# Patient Record
Sex: Male | Born: 1940
Health system: Southern US, Community
[De-identification: ages and names within clinical notes are randomized; demographics above are authoritative.]

## PROBLEM LIST (undated history)

## (undated) DIAGNOSIS — R413 Other amnesia: Secondary | ICD-10-CM

## (undated) DIAGNOSIS — E119 Type 2 diabetes mellitus without complications: Secondary | ICD-10-CM

## (undated) DIAGNOSIS — I10 Essential (primary) hypertension: Secondary | ICD-10-CM

## (undated) HISTORY — DX: Other amnesia: R41.3

---

## 1990-09-18 HISTORY — PX: BACK SURGERY: SHX140

## 2002-06-22 ENCOUNTER — Emergency Department (HOSPITAL_COMMUNITY): Admission: EM | Admit: 2002-06-22 | Discharge: 2002-06-22 | Payer: Self-pay | Admitting: Emergency Medicine

## 2002-06-22 ENCOUNTER — Encounter: Payer: Self-pay | Admitting: Emergency Medicine

## 2004-08-17 ENCOUNTER — Emergency Department (HOSPITAL_COMMUNITY): Admission: EM | Admit: 2004-08-17 | Discharge: 2004-08-17 | Payer: Self-pay | Admitting: Emergency Medicine

## 2005-05-14 ENCOUNTER — Emergency Department: Payer: Self-pay | Admitting: Internal Medicine

## 2005-12-18 ENCOUNTER — Ambulatory Visit: Payer: Self-pay | Admitting: Gastroenterology

## 2006-08-28 IMAGING — US US ABDOMEN COMPLETE
1 series · 14 of 25 positions shown · non-contrast
Comparison: none

HISTORY: Chest and abdominal pain

[Series 1: unknown · 0.34mm/px · 14 of 66 slices shown]
[im 1/66]
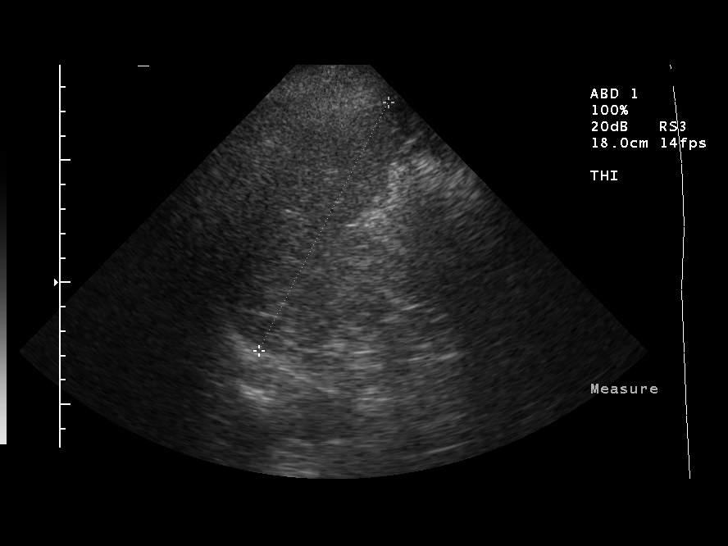
[im 6/66]
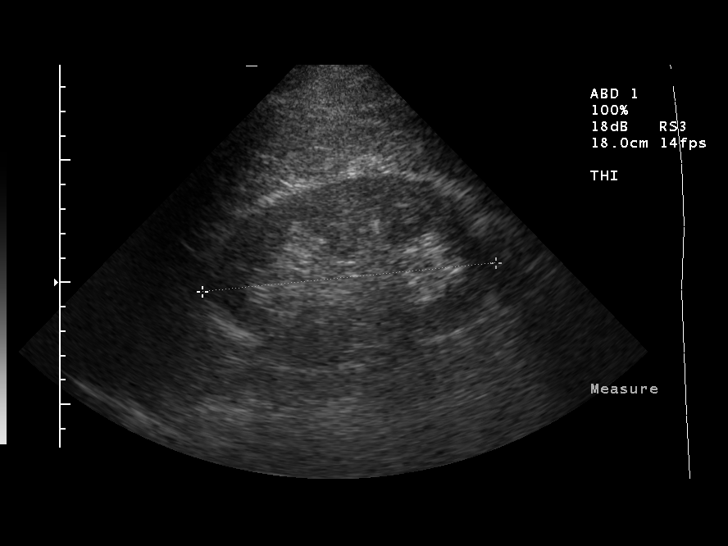
[im 11/66]
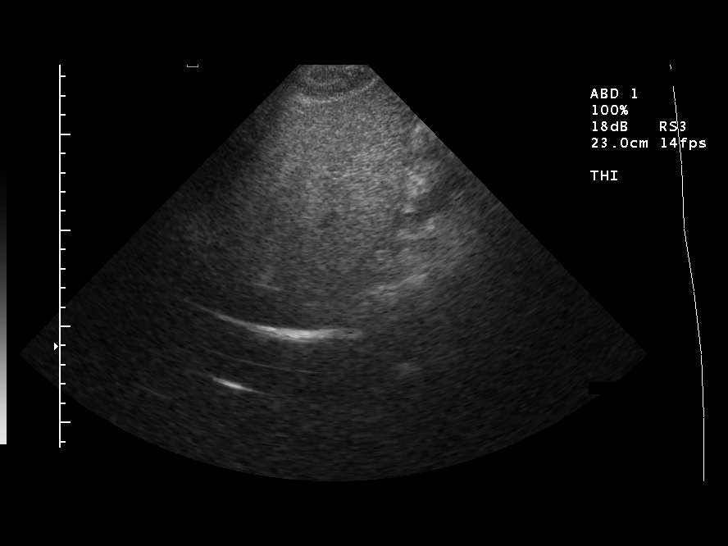
[im 17/66]
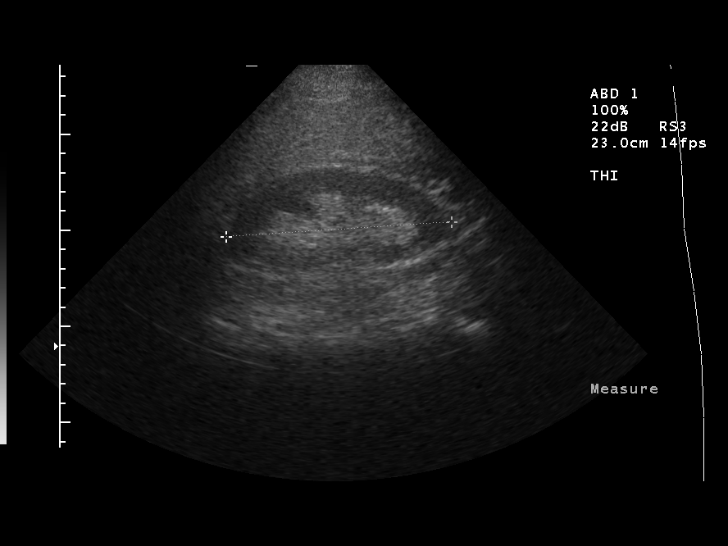
[im 22/66]
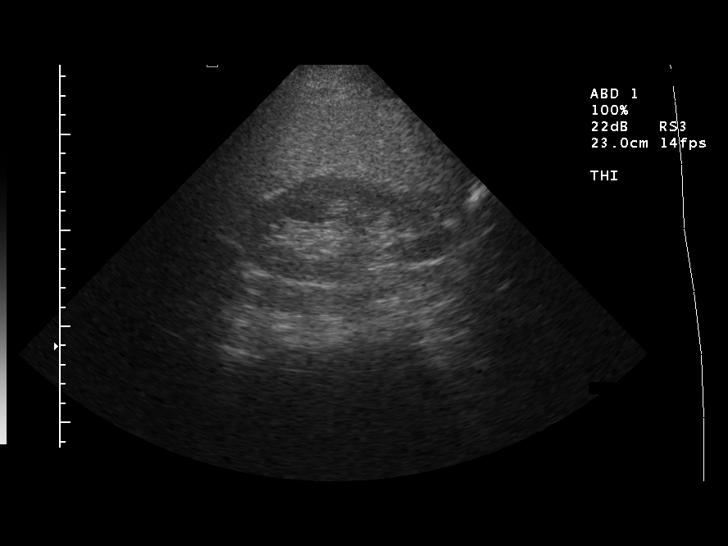
[im 25/66]
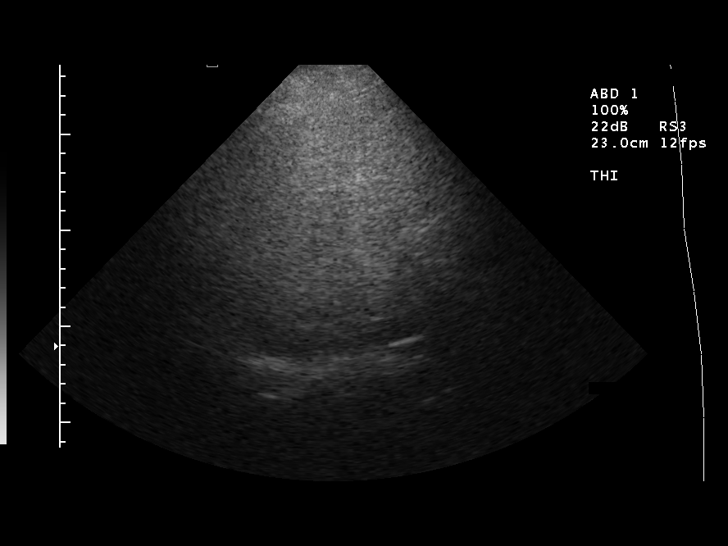
[im 30/66]
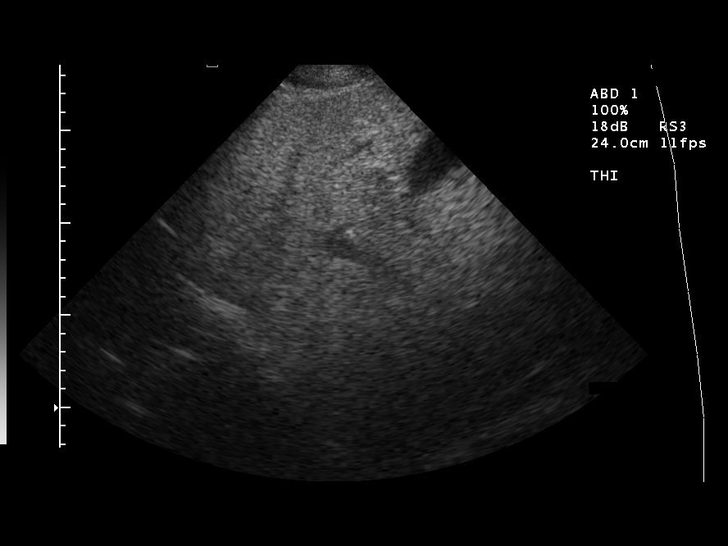
[im 36/66]
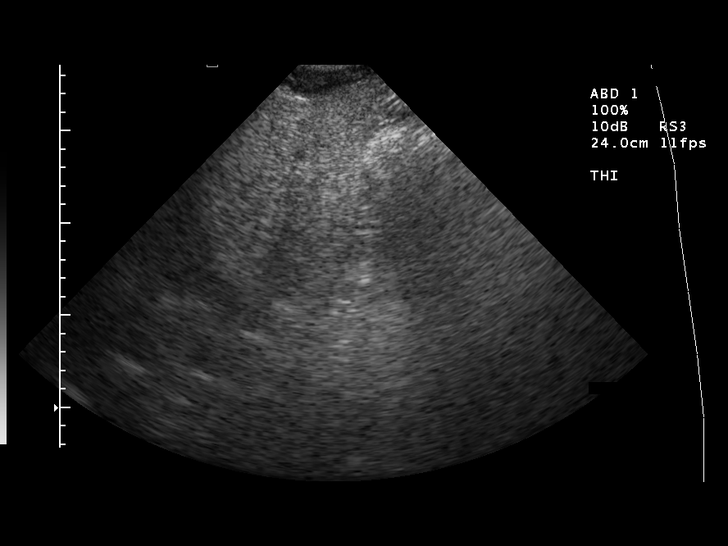
[im 41/66]
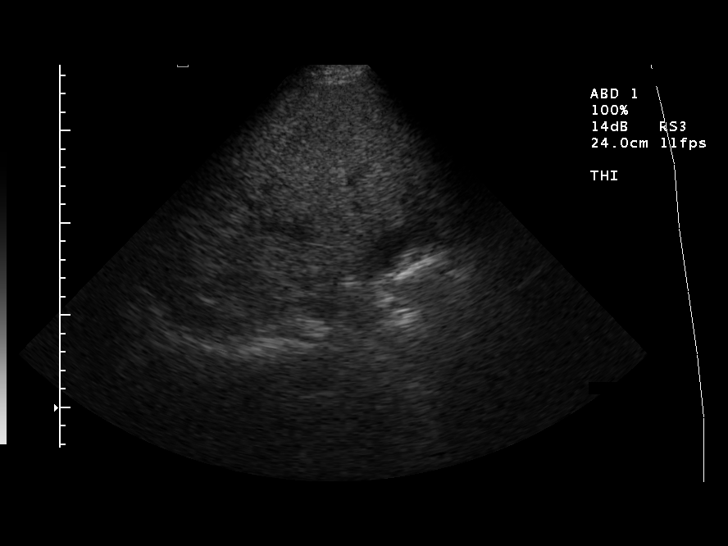
[im 44/66]
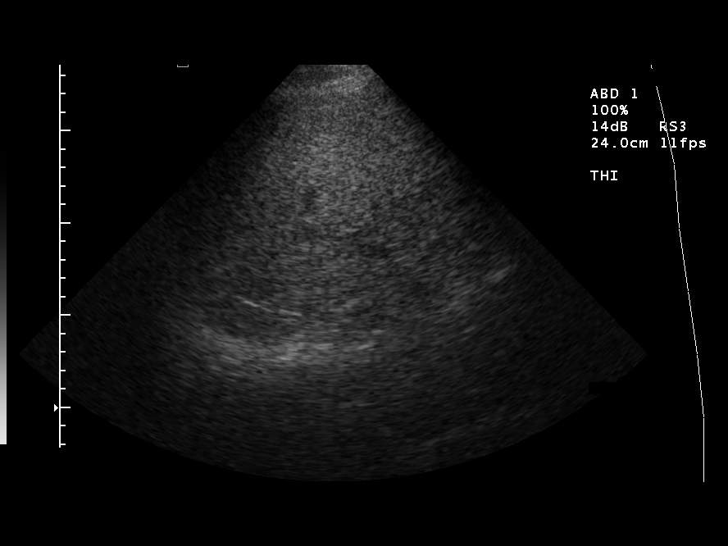
[im 49/66]
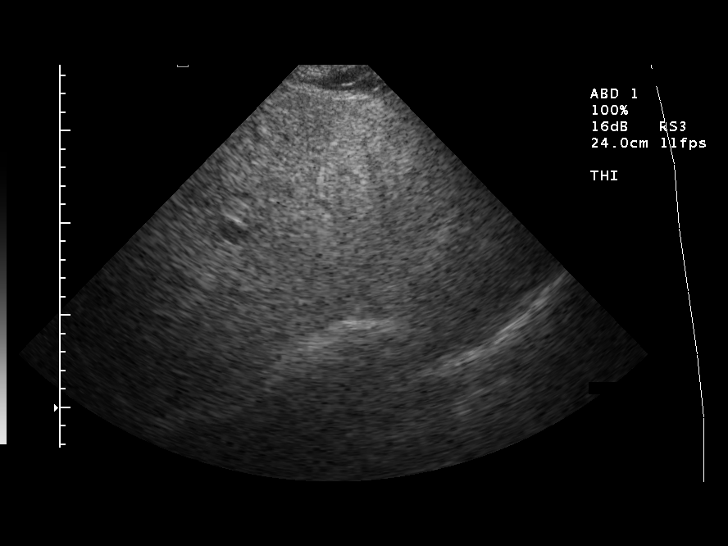
[im 55/66]
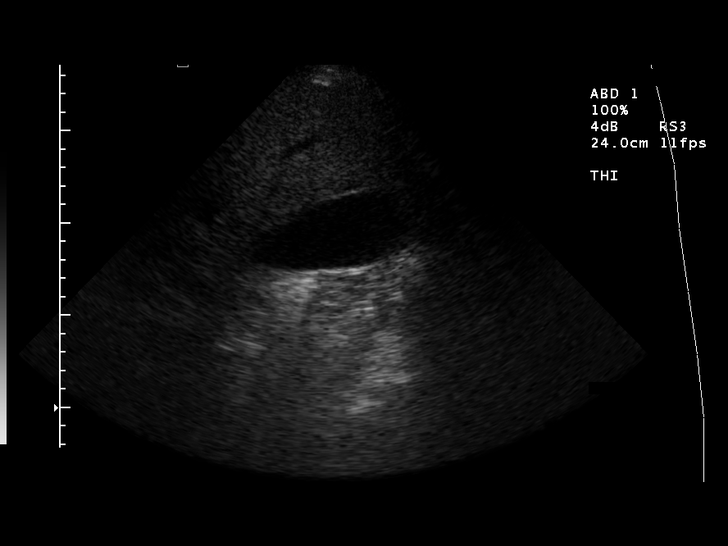
[im 60/66]
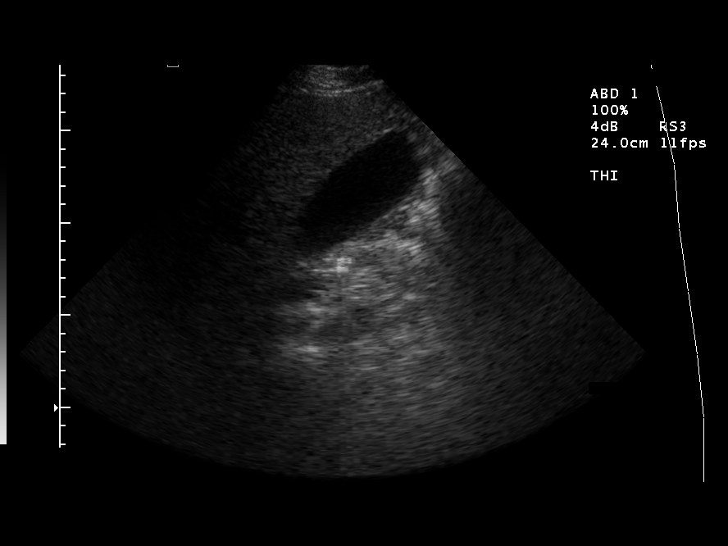
[im 66/66]
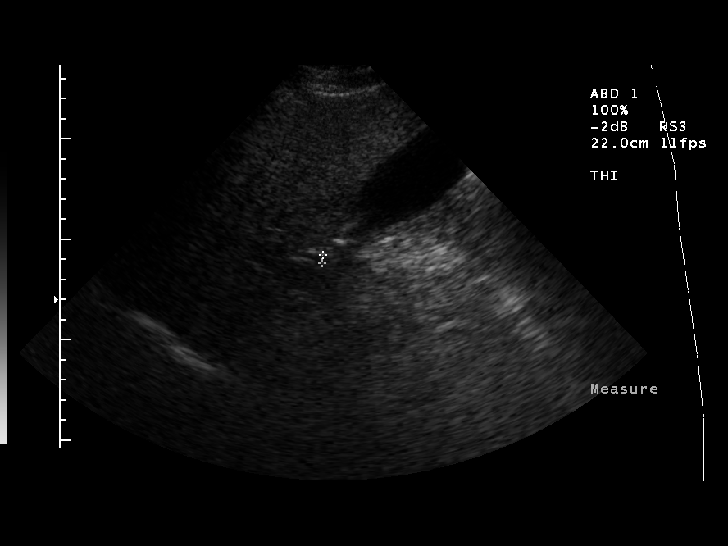

[14 of 25 positions shown; findings below may reference images not displayed]

ULTRASOUND ABDOMEN COMPLETE:

Gallbladder normal without stones or wall thickening.
Common bile duct normal caliber, 4 mm diameter.
Markedly echogenic liver likely represent fatty infiltration.
Poor sound through-transmission through liver, without evidence of gross hepatic
mass.
Pancreas obscured by gout bowel gas.
IVC unremarkable.
Aorta obscured.
Spleen normal size.
Kidneys normal appearance, 11.7 cm length right and 12.1 cm length left.
IMPRESSION: Nonvisualization of pancreas and aorta.
Marked fatty infiltration of liver.
No acute findings.

## 2006-12-13 ENCOUNTER — Emergency Department: Payer: Self-pay | Admitting: Emergency Medicine

## 2006-12-13 ENCOUNTER — Other Ambulatory Visit: Payer: Self-pay

## 2010-11-04 ENCOUNTER — Ambulatory Visit: Payer: Self-pay | Admitting: Internal Medicine

## 2010-11-18 ENCOUNTER — Ambulatory Visit: Payer: Self-pay | Admitting: Internal Medicine

## 2012-06-15 ENCOUNTER — Emergency Department: Payer: Self-pay | Admitting: *Deleted

## 2012-06-15 LAB — PRO B NATRIURETIC PEPTIDE: B-Type Natriuretic Peptide: 77 pg/mL (ref 0–125)

## 2012-06-15 LAB — CBC
HCT: 42.3 % (ref 40.0–52.0)
HGB: 13.8 g/dL (ref 13.0–18.0)
MCH: 29.9 pg (ref 26.0–34.0)
MCHC: 32.6 g/dL (ref 32.0–36.0)
MCV: 92 fL (ref 80–100)
Platelet: 285 10*3/uL (ref 150–440)
RBC: 4.62 10*6/uL (ref 4.40–5.90)
RDW: 12.8 % (ref 11.5–14.5)
WBC: 11.5 10*3/uL — ABNORMAL HIGH (ref 3.8–10.6)

## 2012-06-15 LAB — COMPREHENSIVE METABOLIC PANEL
Albumin: 3.7 g/dL (ref 3.4–5.0)
Alkaline Phosphatase: 85 U/L (ref 50–136)
Anion Gap: 12 (ref 7–16)
BUN: 15 mg/dL (ref 7–18)
Bilirubin,Total: 0.3 mg/dL (ref 0.2–1.0)
Calcium, Total: 8.9 mg/dL (ref 8.5–10.1)
Chloride: 104 mmol/L (ref 98–107)
Co2: 26 mmol/L (ref 21–32)
Creatinine: 1.22 mg/dL (ref 0.60–1.30)
EGFR (African American): >60
EGFR (Non-African Amer.): 60 — ABNORMAL LOW
Glucose: 153 mg/dL — ABNORMAL HIGH (ref 65–99)
Osmolality: 287 (ref 275–301)
Potassium: 3.4 mmol/L — ABNORMAL LOW (ref 3.5–5.1)
SGOT(AST): 20 U/L (ref 15–37)
SGPT (ALT): 33 U/L (ref 12–78)
Sodium: 142 mmol/L (ref 136–145)
Total Protein: 7.7 g/dL (ref 6.4–8.2)

## 2012-06-15 LAB — CK TOTAL AND CKMB (NOT AT ARMC)
CK, Total: 141 U/L (ref 35–232)
CK-MB: 2.3 ng/mL (ref 0.5–3.6)

## 2012-06-15 LAB — TROPONIN I: Troponin-I: <0.02 ng/mL

## 2014-06-15 ENCOUNTER — Ambulatory Visit: Payer: Self-pay | Admitting: Internal Medicine

## 2015-03-03 DIAGNOSIS — Z125 Encounter for screening for malignant neoplasm of prostate: Secondary | ICD-10-CM | POA: Diagnosis not present

## 2015-03-03 DIAGNOSIS — E784 Other hyperlipidemia: Secondary | ICD-10-CM | POA: Diagnosis not present

## 2015-03-03 DIAGNOSIS — I10 Essential (primary) hypertension: Secondary | ICD-10-CM | POA: Diagnosis not present

## 2015-03-03 DIAGNOSIS — R5381 Other malaise: Secondary | ICD-10-CM | POA: Diagnosis not present

## 2015-03-18 DIAGNOSIS — E119 Type 2 diabetes mellitus without complications: Secondary | ICD-10-CM | POA: Diagnosis not present

## 2015-03-18 DIAGNOSIS — M5440 Lumbago with sciatica, unspecified side: Secondary | ICD-10-CM | POA: Diagnosis not present

## 2015-03-18 DIAGNOSIS — N423 Dysplasia of prostate: Secondary | ICD-10-CM | POA: Diagnosis not present

## 2015-03-18 DIAGNOSIS — K210 Gastro-esophageal reflux disease with esophagitis: Secondary | ICD-10-CM | POA: Diagnosis not present

## 2015-05-25 DIAGNOSIS — J209 Acute bronchitis, unspecified: Secondary | ICD-10-CM | POA: Diagnosis not present

## 2015-05-25 DIAGNOSIS — J028 Acute pharyngitis due to other specified organisms: Secondary | ICD-10-CM | POA: Diagnosis not present

## 2015-05-25 DIAGNOSIS — B9689 Other specified bacterial agents as the cause of diseases classified elsewhere: Secondary | ICD-10-CM | POA: Diagnosis not present

## 2015-07-06 DIAGNOSIS — Z23 Encounter for immunization: Secondary | ICD-10-CM | POA: Diagnosis not present

## 2015-07-12 DIAGNOSIS — E119 Type 2 diabetes mellitus without complications: Secondary | ICD-10-CM | POA: Diagnosis not present

## 2015-07-13 DIAGNOSIS — E119 Type 2 diabetes mellitus without complications: Secondary | ICD-10-CM | POA: Diagnosis not present

## 2015-07-13 DIAGNOSIS — Z0000 Encounter for general adult medical examination without abnormal findings: Secondary | ICD-10-CM | POA: Diagnosis not present

## 2015-07-13 DIAGNOSIS — Z23 Encounter for immunization: Secondary | ICD-10-CM | POA: Diagnosis not present

## 2015-07-23 DIAGNOSIS — R05 Cough: Secondary | ICD-10-CM | POA: Diagnosis not present

## 2015-07-23 DIAGNOSIS — B9689 Other specified bacterial agents as the cause of diseases classified elsewhere: Secondary | ICD-10-CM | POA: Diagnosis not present

## 2015-07-23 DIAGNOSIS — J028 Acute pharyngitis due to other specified organisms: Secondary | ICD-10-CM | POA: Diagnosis not present

## 2015-07-23 DIAGNOSIS — E784 Other hyperlipidemia: Secondary | ICD-10-CM | POA: Diagnosis not present

## 2015-10-21 DIAGNOSIS — E119 Type 2 diabetes mellitus without complications: Secondary | ICD-10-CM | POA: Diagnosis not present

## 2015-10-21 DIAGNOSIS — R55 Syncope and collapse: Secondary | ICD-10-CM | POA: Diagnosis not present

## 2015-10-26 DIAGNOSIS — E119 Type 2 diabetes mellitus without complications: Secondary | ICD-10-CM | POA: Diagnosis not present

## 2015-10-26 DIAGNOSIS — R55 Syncope and collapse: Secondary | ICD-10-CM | POA: Diagnosis not present

## 2015-10-26 DIAGNOSIS — G8929 Other chronic pain: Secondary | ICD-10-CM | POA: Diagnosis not present

## 2015-10-28 DIAGNOSIS — R55 Syncope and collapse: Secondary | ICD-10-CM | POA: Diagnosis not present

## 2015-10-28 DIAGNOSIS — G8929 Other chronic pain: Secondary | ICD-10-CM | POA: Diagnosis not present

## 2015-10-28 DIAGNOSIS — E119 Type 2 diabetes mellitus without complications: Secondary | ICD-10-CM | POA: Diagnosis not present

## 2015-10-29 DIAGNOSIS — G8929 Other chronic pain: Secondary | ICD-10-CM | POA: Diagnosis not present

## 2015-10-29 DIAGNOSIS — E119 Type 2 diabetes mellitus without complications: Secondary | ICD-10-CM | POA: Diagnosis not present

## 2015-10-29 DIAGNOSIS — R55 Syncope and collapse: Secondary | ICD-10-CM | POA: Diagnosis not present

## 2016-03-31 DIAGNOSIS — E119 Type 2 diabetes mellitus without complications: Secondary | ICD-10-CM | POA: Diagnosis not present

## 2016-03-31 DIAGNOSIS — K210 Gastro-esophageal reflux disease with esophagitis: Secondary | ICD-10-CM | POA: Diagnosis not present

## 2016-03-31 DIAGNOSIS — M545 Low back pain: Secondary | ICD-10-CM | POA: Diagnosis not present

## 2016-05-26 DIAGNOSIS — K210 Gastro-esophageal reflux disease with esophagitis: Secondary | ICD-10-CM | POA: Diagnosis not present

## 2016-05-26 DIAGNOSIS — M545 Low back pain: Secondary | ICD-10-CM | POA: Diagnosis not present

## 2016-05-26 DIAGNOSIS — E119 Type 2 diabetes mellitus without complications: Secondary | ICD-10-CM | POA: Diagnosis not present

## 2016-08-25 DIAGNOSIS — M5440 Lumbago with sciatica, unspecified side: Secondary | ICD-10-CM | POA: Diagnosis not present

## 2016-08-25 DIAGNOSIS — D291 Benign neoplasm of prostate: Secondary | ICD-10-CM | POA: Diagnosis not present

## 2016-08-25 DIAGNOSIS — M25569 Pain in unspecified knee: Secondary | ICD-10-CM | POA: Diagnosis not present

## 2016-08-25 DIAGNOSIS — E119 Type 2 diabetes mellitus without complications: Secondary | ICD-10-CM | POA: Diagnosis not present

## 2016-11-28 DIAGNOSIS — D291 Benign neoplasm of prostate: Secondary | ICD-10-CM | POA: Diagnosis not present

## 2016-11-28 DIAGNOSIS — K591 Functional diarrhea: Secondary | ICD-10-CM | POA: Diagnosis not present

## 2016-11-28 DIAGNOSIS — I10 Essential (primary) hypertension: Secondary | ICD-10-CM | POA: Diagnosis not present

## 2016-11-28 DIAGNOSIS — E119 Type 2 diabetes mellitus without complications: Secondary | ICD-10-CM | POA: Diagnosis not present

## 2017-09-25 DIAGNOSIS — M25569 Pain in unspecified knee: Secondary | ICD-10-CM | POA: Diagnosis not present

## 2017-09-25 DIAGNOSIS — E119 Type 2 diabetes mellitus without complications: Secondary | ICD-10-CM | POA: Diagnosis not present

## 2017-09-25 DIAGNOSIS — M545 Low back pain: Secondary | ICD-10-CM | POA: Diagnosis not present

## 2017-09-25 DIAGNOSIS — I10 Essential (primary) hypertension: Secondary | ICD-10-CM | POA: Diagnosis not present

## 2017-12-24 DIAGNOSIS — E119 Type 2 diabetes mellitus without complications: Secondary | ICD-10-CM | POA: Diagnosis not present

## 2017-12-24 DIAGNOSIS — M545 Low back pain: Secondary | ICD-10-CM | POA: Diagnosis not present

## 2017-12-24 DIAGNOSIS — G8929 Other chronic pain: Secondary | ICD-10-CM | POA: Diagnosis not present

## 2017-12-24 DIAGNOSIS — I10 Essential (primary) hypertension: Secondary | ICD-10-CM | POA: Diagnosis not present

## 2017-12-29 ENCOUNTER — Emergency Department
Admission: EM | Admit: 2017-12-29 | Discharge: 2017-12-29 | Disposition: A | Discharge status: Home / Self Care | Payer: Medicare HMO | Attending: Emergency Medicine | Admitting: Emergency Medicine

## 2017-12-29 ENCOUNTER — Other Ambulatory Visit: Payer: Self-pay

## 2017-12-29 ENCOUNTER — Encounter: Payer: Self-pay | Admitting: Emergency Medicine

## 2017-12-29 DIAGNOSIS — Z87891 Personal history of nicotine dependence: Secondary | ICD-10-CM | POA: Diagnosis not present

## 2017-12-29 DIAGNOSIS — R1111 Vomiting without nausea: Secondary | ICD-10-CM | POA: Diagnosis not present

## 2017-12-29 DIAGNOSIS — E119 Type 2 diabetes mellitus without complications: Secondary | ICD-10-CM | POA: Insufficient documentation

## 2017-12-29 DIAGNOSIS — K2900 Acute gastritis without bleeding: Secondary | ICD-10-CM | POA: Insufficient documentation

## 2017-12-29 DIAGNOSIS — I10 Essential (primary) hypertension: Secondary | ICD-10-CM | POA: Diagnosis not present

## 2017-12-29 DIAGNOSIS — R112 Nausea with vomiting, unspecified: Secondary | ICD-10-CM | POA: Diagnosis not present

## 2017-12-29 HISTORY — DX: Type 2 diabetes mellitus without complications: E11.9

## 2017-12-29 HISTORY — DX: Essential (primary) hypertension: I10

## 2017-12-29 LAB — LIPASE, BLOOD: LIPASE: 26 U/L (ref 11–51)

## 2017-12-29 LAB — COMPREHENSIVE METABOLIC PANEL
ALT: 17 U/L (ref 17–63)
ANION GAP: 7 (ref 5–15)
AST: 23 U/L (ref 15–41)
Albumin: 3.8 g/dL (ref 3.5–5.0)
Alkaline Phosphatase: 61 U/L (ref 38–126)
BUN: 22 mg/dL — ABNORMAL HIGH (ref 6–20)
CHLORIDE: 105 mmol/L (ref 101–111)
CO2: 24 mmol/L (ref 22–32)
CREATININE: 1.55 mg/dL — AB (ref 0.61–1.24)
Calcium: 8.5 mg/dL — ABNORMAL LOW (ref 8.9–10.3)
GFR, EST AFRICAN AMERICAN: 48 mL/min — AB (ref >60)
GFR, EST NON AFRICAN AMERICAN: 42 mL/min — AB (ref >60)
Glucose, Bld: 161 mg/dL — ABNORMAL HIGH (ref 65–99)
POTASSIUM: 3.1 mmol/L — AB (ref 3.5–5.1)
Sodium: 136 mmol/L (ref 135–145)
Total Bilirubin: 0.6 mg/dL (ref 0.3–1.2)
Total Protein: 6.9 g/dL (ref 6.5–8.1)

## 2017-12-29 LAB — CBC
HCT: 36.8 % — ABNORMAL LOW (ref 40.0–52.0)
Hemoglobin: 12.8 g/dL — ABNORMAL LOW (ref 13.0–18.0)
MCH: 32.1 pg (ref 26.0–34.0)
MCHC: 34.7 g/dL (ref 32.0–36.0)
MCV: 92.5 fL (ref 80.0–100.0)
PLATELETS: 275 10*3/uL (ref 150–440)
RBC: 3.98 MIL/uL — ABNORMAL LOW (ref 4.40–5.90)
RDW: 13.2 % (ref 11.5–14.5)
WBC: 12.2 10*3/uL — ABNORMAL HIGH (ref 3.8–10.6)

## 2017-12-29 LAB — TROPONIN I

## 2017-12-29 MED ORDER — SODIUM CHLORIDE 0.9 % IV SOLN
1000.0000 mL | Freq: Once | INTRAVENOUS | Status: AC
  Administered 2017-12-29: 1000 mL via INTRAVENOUS

## 2017-12-29 MED ORDER — ONDANSETRON HCL 4 MG/2ML IJ SOLN
4.0000 mg | Freq: Once | INTRAMUSCULAR | Status: AC
  Administered 2017-12-29: 4 mg via INTRAVENOUS
  Filled 2017-12-29: qty 2

## 2017-12-29 MED ORDER — ONDANSETRON 4 MG PO TBDP: 4.0000 mg | ORAL_TABLET | Freq: Three times a day (TID) | ORAL | 0 refills | Status: DC | PRN

## 2017-12-29 NOTE — ED Provider Notes (Addendum)
[facility] Emergency Department Provider Note   ____________________________________________    I have reviewed the triage vital signs and the nursing notes.   HISTORY  Chief Complaint Emesis     HPI Bob Brewer is a 77 y.o. male who presents with complaints of nausea and vomiting.  Patient has a history of diabetes.  He reports he ate dinner at approximately 430, he had spaghetti, about 10-15 minutes later he started having nausea and vomiting.  He denies abdominal pain, just complains of "feeling sick ".  No fevers or chills.  No one else got sick.  No recent travel.  No diarrhea reported.  Has not taken anything for this.  No chest pain or shortness of breath   Past Medical History:  Diagnosis Date  . Diabetes mellitus without complication ([place])   . Hypertension     There are no active problems to display for this patient.   History reviewed. No pertinent surgical history.  Prior to Admission medications   Medication Sig Start Date End Date Taking? Authorizing Provider  ondansetron (ZOFRAN ODT) 4 MG disintegrating tablet Take 1 tablet (4 mg total) by mouth every 8 (eight) hours as needed for nausea or vomiting. 12/29/17   Lavonia Drafts, MD     Allergies Patient has no known allergies.  History reviewed. No pertinent family history.  Social History Social History   Tobacco Use  . Smoking status: Former Research scientist (life sciences)  . Smokeless tobacco: Never Used  Substance Use Topics  . Alcohol use: Not Currently  . Drug use: Never    Review of Systems  Constitutional: No fever/chills Eyes: No visual changes.  ENT: No sore throat. Cardiovascular: Denies chest pain. Respiratory: Denies shortness of breath. Gastrointestinal: As above Genitourinary: Negative for dysuria. Musculoskeletal: Negative for back pain. Skin: Negative for rash. Neurological: Negative for headaches    ____________________________________________   PHYSICAL  EXAM:  VITAL SIGNS: ED Triage Vitals  Enc Vitals Group     BP 12/29/17 1736 132/72     Pulse Rate 12/29/17 1736 69     Resp 12/29/17 1736 16     Temp 12/29/17 1736 97.7 F (36.5 C)     Temp Source 12/29/17 1736 Oral     SpO2 12/29/17 1736 99 %     Weight 12/29/17 1733 83.9 kg (185 lb)     Height 12/29/17 1733 1.727 m (5\' 8" )     Head Circumference --      Peak Flow --      Pain Score 12/29/17 1733 0     Pain Loc --      Pain Edu? --      Excl. in [place]? --     Constitutional: Alert and oriented. No acute distress. Pleasant and interactive Eyes: Conjunctivae are normal.   Nose: No congestion/rhinnorhea. Mouth/Throat: Mucous membranes are moist.    Cardiovascular: Normal rate, regular rhythm. Grossly normal heart sounds.  Good peripheral circulation. Respiratory: Normal respiratory effort.  No retractions. Lungs CTAB. Gastrointestinal: Soft and nontender. No distention.  No CVA tenderness. Genitourinary: deferred Musculoskeletal: No lower extremity tenderness nor edema.  Warm and well perfused Neurologic:  Normal speech and language. No gross focal neurologic deficits are appreciated.  Skin:  Skin is warm, dry and intact. No rash noted. Psychiatric: Mood and affect are normal. Speech and behavior are normal.  ____________________________________________   LABS (all labs ordered are listed, but only abnormal results are displayed)  Labs Reviewed  CBC - Abnormal; Notable  for the following components:      Result Value   WBC 12.2 (*)    RBC 3.98 (*)    Hemoglobin 12.8 (*)    HCT 36.8 (*)    All other components within normal limits  COMPREHENSIVE METABOLIC PANEL - Abnormal; Notable for the following components:   Potassium 3.1 (*)    Glucose, Bld 161 (*)    BUN 22 (*)    Creatinine, Ser 1.55 (*)    Calcium 8.5 (*)    GFR calc non Af Amer 42 (*)    GFR calc Af Amer 48 (*)    All other components within normal limits  LIPASE, BLOOD  TROPONIN I    ____________________________________________  EKG  ED ECG REPORT I, Lavonia Drafts, the attending physician, personally viewed and interpreted this ECG.  Date: 01/14/2018  Rhythm: normal sinus rhythm QRS Axis: normal Intervals: normal ST/T Wave abnormalities: normal Narrative Interpretation: no evidence of acute ischemia  ____________________________________________  RADIOLOGY  None ____________________________________________   PROCEDURES  Procedure(s) performed: No  Procedures   Critical Care performed: No ____________________________________________   INITIAL IMPRESSION / ASSESSMENT AND PLAN / ED COURSE  Pertinent labs & imaging results that were available during my care of the patient were reviewed by me and considered in my medical decision making (see chart for details).  Patient presents with nausea/vomiting after eating spaghetti.  Reassuring abdominal exam, will give IV fluids, IV Zofran, check labs, place patient on a cardiac monitor obtain EKG and reevaluate.  After IV fluids patient reports feeling significantly better.  He no longer has any nausea.  He looks significantly better.  Family reports that he is back to baseline.  No abdominal pain, he is anxious to go home I think this is reasonable.  Recommend continued fluid intake, Zofran Rx    ____________________________________________   FINAL CLINICAL IMPRESSION(S) / ED DIAGNOSES  Final diagnoses:  Acute gastritis without hemorrhage, unspecified gastritis type        Note:  This document was prepared using Dragon voice recognition software and may include unintentional dictation errors.    Lavonia Drafts, MD 12/29/17 2105    Lavonia Drafts, MD 01/14/18 1540

## 2017-12-29 NOTE — ED Triage Notes (Signed)
Pt prsents to ED via [facility] from home c/o vomiting. Pt states he had dinner at 1630 and since has vomited multiple times. Pt denies pain at this time. Given 4mg  Zofran IV PTA by EMS.

## 2017-12-29 NOTE — ED Notes (Signed)

## 2018-06-19 DIAGNOSIS — Z23 Encounter for immunization: Secondary | ICD-10-CM | POA: Diagnosis not present

## 2018-07-22 DIAGNOSIS — E119 Type 2 diabetes mellitus without complications: Secondary | ICD-10-CM | POA: Diagnosis not present

## 2018-07-22 DIAGNOSIS — E7849 Other hyperlipidemia: Secondary | ICD-10-CM | POA: Diagnosis not present

## 2018-07-22 DIAGNOSIS — R5381 Other malaise: Secondary | ICD-10-CM | POA: Diagnosis not present

## 2018-07-22 DIAGNOSIS — M545 Low back pain: Secondary | ICD-10-CM | POA: Diagnosis not present

## 2018-07-22 DIAGNOSIS — G8929 Other chronic pain: Secondary | ICD-10-CM | POA: Diagnosis not present

## 2018-07-22 DIAGNOSIS — I10 Essential (primary) hypertension: Secondary | ICD-10-CM | POA: Diagnosis not present

## 2018-07-22 DIAGNOSIS — Z0000 Encounter for general adult medical examination without abnormal findings: Secondary | ICD-10-CM | POA: Diagnosis not present

## 2018-07-22 DIAGNOSIS — Z125 Encounter for screening for malignant neoplasm of prostate: Secondary | ICD-10-CM | POA: Diagnosis not present

## 2018-08-05 DIAGNOSIS — G8929 Other chronic pain: Secondary | ICD-10-CM | POA: Diagnosis not present

## 2018-08-05 DIAGNOSIS — M545 Low back pain: Secondary | ICD-10-CM | POA: Diagnosis not present

## 2018-08-05 DIAGNOSIS — E119 Type 2 diabetes mellitus without complications: Secondary | ICD-10-CM | POA: Diagnosis not present

## 2018-08-05 DIAGNOSIS — I10 Essential (primary) hypertension: Secondary | ICD-10-CM | POA: Diagnosis not present

## 2019-01-31 DIAGNOSIS — Z0000 Encounter for general adult medical examination without abnormal findings: Secondary | ICD-10-CM | POA: Diagnosis not present

## 2019-01-31 DIAGNOSIS — G8929 Other chronic pain: Secondary | ICD-10-CM | POA: Diagnosis not present

## 2019-01-31 DIAGNOSIS — R413 Other amnesia: Secondary | ICD-10-CM | POA: Diagnosis not present

## 2019-01-31 DIAGNOSIS — E119 Type 2 diabetes mellitus without complications: Secondary | ICD-10-CM | POA: Diagnosis not present

## 2019-01-31 DIAGNOSIS — M545 Low back pain: Secondary | ICD-10-CM | POA: Diagnosis not present

## 2019-04-10 DIAGNOSIS — G8929 Other chronic pain: Secondary | ICD-10-CM | POA: Diagnosis not present

## 2019-04-10 DIAGNOSIS — M5440 Lumbago with sciatica, unspecified side: Secondary | ICD-10-CM | POA: Diagnosis not present

## 2019-04-10 DIAGNOSIS — E119 Type 2 diabetes mellitus without complications: Secondary | ICD-10-CM | POA: Diagnosis not present

## 2019-04-10 DIAGNOSIS — R413 Other amnesia: Secondary | ICD-10-CM | POA: Diagnosis not present

## 2019-06-29 DIAGNOSIS — R69 Illness, unspecified: Secondary | ICD-10-CM | POA: Diagnosis not present

## 2019-07-16 DIAGNOSIS — I10 Essential (primary) hypertension: Secondary | ICD-10-CM | POA: Diagnosis not present

## 2019-07-16 DIAGNOSIS — Z125 Encounter for screening for malignant neoplasm of prostate: Secondary | ICD-10-CM | POA: Diagnosis not present

## 2019-07-16 DIAGNOSIS — E119 Type 2 diabetes mellitus without complications: Secondary | ICD-10-CM | POA: Diagnosis not present

## 2019-07-16 DIAGNOSIS — R5381 Other malaise: Secondary | ICD-10-CM | POA: Diagnosis not present

## 2019-07-16 DIAGNOSIS — E7849 Other hyperlipidemia: Secondary | ICD-10-CM | POA: Diagnosis not present

## 2019-07-28 ENCOUNTER — Other Ambulatory Visit: Payer: Self-pay

## 2019-07-28 DIAGNOSIS — Z20822 Contact with and (suspected) exposure to covid-19: Secondary | ICD-10-CM

## 2019-07-29 ENCOUNTER — Ambulatory Visit: Payer: Self-pay

## 2019-07-29 DIAGNOSIS — I10 Essential (primary) hypertension: Secondary | ICD-10-CM | POA: Diagnosis not present

## 2019-07-29 DIAGNOSIS — E119 Type 2 diabetes mellitus without complications: Secondary | ICD-10-CM | POA: Diagnosis not present

## 2019-07-29 DIAGNOSIS — M545 Low back pain: Secondary | ICD-10-CM | POA: Diagnosis not present

## 2019-07-29 DIAGNOSIS — G8929 Other chronic pain: Secondary | ICD-10-CM | POA: Diagnosis not present

## 2019-07-29 LAB — NOVEL CORONAVIRUS, NAA: SARS-CoV-2, NAA: DETECTED — AB

## 2019-07-29 NOTE — Telephone Encounter (Signed)
Provided Covid results to Patient.  Provided care advice to pt.   Voiced understanding.

## 2019-08-21 DIAGNOSIS — R69 Illness, unspecified: Secondary | ICD-10-CM | POA: Diagnosis not present

## 2019-08-28 DIAGNOSIS — R69 Illness, unspecified: Secondary | ICD-10-CM | POA: Diagnosis not present

## 2019-10-24 DIAGNOSIS — I4891 Unspecified atrial fibrillation: Secondary | ICD-10-CM | POA: Diagnosis not present

## 2019-10-24 DIAGNOSIS — E538 Deficiency of other specified B group vitamins: Secondary | ICD-10-CM | POA: Diagnosis not present

## 2019-10-24 DIAGNOSIS — E119 Type 2 diabetes mellitus without complications: Secondary | ICD-10-CM | POA: Diagnosis not present

## 2019-10-24 DIAGNOSIS — I10 Essential (primary) hypertension: Secondary | ICD-10-CM | POA: Diagnosis not present

## 2019-10-24 DIAGNOSIS — M545 Low back pain: Secondary | ICD-10-CM | POA: Diagnosis not present

## 2019-10-24 DIAGNOSIS — M542 Cervicalgia: Secondary | ICD-10-CM | POA: Diagnosis not present

## 2019-10-24 DIAGNOSIS — G8929 Other chronic pain: Secondary | ICD-10-CM | POA: Diagnosis not present

## 2019-10-24 DIAGNOSIS — R5381 Other malaise: Secondary | ICD-10-CM | POA: Diagnosis not present

## 2019-10-24 DIAGNOSIS — E7849 Other hyperlipidemia: Secondary | ICD-10-CM | POA: Diagnosis not present

## 2019-10-27 DIAGNOSIS — R413 Other amnesia: Secondary | ICD-10-CM | POA: Diagnosis not present

## 2019-10-27 DIAGNOSIS — I4891 Unspecified atrial fibrillation: Secondary | ICD-10-CM | POA: Diagnosis not present

## 2019-10-27 DIAGNOSIS — I10 Essential (primary) hypertension: Secondary | ICD-10-CM | POA: Diagnosis not present

## 2019-10-27 DIAGNOSIS — E119 Type 2 diabetes mellitus without complications: Secondary | ICD-10-CM | POA: Diagnosis not present

## 2019-11-24 ENCOUNTER — Other Ambulatory Visit: Payer: Self-pay | Admitting: Internal Medicine

## 2019-11-24 DIAGNOSIS — I4891 Unspecified atrial fibrillation: Secondary | ICD-10-CM | POA: Diagnosis not present

## 2019-11-24 DIAGNOSIS — R413 Other amnesia: Secondary | ICD-10-CM | POA: Diagnosis not present

## 2019-11-24 DIAGNOSIS — E119 Type 2 diabetes mellitus without complications: Secondary | ICD-10-CM | POA: Diagnosis not present

## 2019-11-24 DIAGNOSIS — I10 Essential (primary) hypertension: Secondary | ICD-10-CM | POA: Diagnosis not present

## 2019-12-01 DIAGNOSIS — R413 Other amnesia: Secondary | ICD-10-CM | POA: Diagnosis not present

## 2019-12-01 DIAGNOSIS — M545 Low back pain: Secondary | ICD-10-CM | POA: Diagnosis not present

## 2019-12-01 DIAGNOSIS — G8929 Other chronic pain: Secondary | ICD-10-CM | POA: Diagnosis not present

## 2019-12-01 DIAGNOSIS — E538 Deficiency of other specified B group vitamins: Secondary | ICD-10-CM | POA: Diagnosis not present

## 2019-12-02 ENCOUNTER — Ambulatory Visit: Payer: Medicare HMO | Attending: Internal Medicine

## 2020-01-12 ENCOUNTER — Ambulatory Visit: Payer: Medicare HMO

## 2020-01-26 ENCOUNTER — Ambulatory Visit: Payer: Medicare HMO | Admitting: Internal Medicine

## 2020-01-26 ENCOUNTER — Telehealth: Payer: Self-pay | Admitting: Internal Medicine

## 2020-01-26 ENCOUNTER — Other Ambulatory Visit: Payer: Self-pay

## 2020-01-26 MED ORDER — DONEPEZIL HCL 10 MG PO TABS: 10.0000 mg | ORAL_TABLET | Freq: Every day | ORAL | 3 refills | Status: DC

## 2020-01-26 MED ORDER — HYDROCHLOROTHIAZIDE 25 MG PO TABS: 25.0000 mg | ORAL_TABLET | Freq: Every day | ORAL | 3 refills | Status: DC

## 2020-01-26 MED ORDER — FINASTERIDE 5 MG PO TABS: 5.0000 mg | ORAL_TABLET | Freq: Every day | ORAL | 3 refills | Status: DC

## 2020-01-26 MED ORDER — LISINOPRIL 40 MG PO TABS: 40.0000 mg | ORAL_TABLET | Freq: Every day | ORAL | 3 refills | Status: DC

## 2020-01-26 MED ORDER — PRAVASTATIN SODIUM 40 MG PO TABS: 40.0000 mg | ORAL_TABLET | Freq: Every day | ORAL | 3 refills | Status: DC

## 2020-01-26 NOTE — Telephone Encounter (Signed)
Patient called for update on status of Brain Scan

## 2020-01-26 NOTE — Telephone Encounter (Deleted)
Spoke with patient and informed him that we have not received his results for the sleep study yet. Feeling Doristine Devoid will inform patient or results. I let patient know that once we receive it I will let him know.

## 2020-01-27 ENCOUNTER — Other Ambulatory Visit: Payer: Self-pay | Admitting: *Deleted

## 2020-01-27 MED ORDER — ALCOHOL PREP PADS: 1.0000 | MEDICATED_PAD | Freq: Every day | 3 refills | Status: DC

## 2020-01-27 NOTE — Telephone Encounter (Signed)
Scan has been authorized and rescheduled. Patient is aware of new apt.

## 2020-01-28 ENCOUNTER — Other Ambulatory Visit: Payer: Self-pay | Admitting: *Deleted

## 2020-01-28 MED ORDER — LANCETS 33G MISC: 3 refills | Status: DC

## 2020-01-28 MED ORDER — RELION TRUE METRIX TEST STRIPS VI STRP: ORAL_STRIP | 3 refills | Status: DC

## 2020-01-28 MED ORDER — TRUE METRIX AIR GLUCOSE METER DEVI: 6 refills | Status: DC

## 2020-02-02 ENCOUNTER — Other Ambulatory Visit: Payer: Self-pay | Admitting: *Deleted

## 2020-02-02 DIAGNOSIS — R413 Other amnesia: Secondary | ICD-10-CM

## 2020-02-04 ENCOUNTER — Ambulatory Visit: Payer: Medicare HMO

## 2020-02-17 ENCOUNTER — Other Ambulatory Visit: Payer: Self-pay

## 2020-02-17 ENCOUNTER — Ambulatory Visit
Admission: RE | Admit: 2020-02-17 | Discharge: 2020-02-17 | Disposition: A | Discharge status: Home / Self Care | Payer: Medicare HMO | Source: Ambulatory Visit | Attending: Internal Medicine | Admitting: Internal Medicine

## 2020-02-17 DIAGNOSIS — R413 Other amnesia: Secondary | ICD-10-CM | POA: Diagnosis not present

## 2020-02-23 ENCOUNTER — Other Ambulatory Visit: Payer: Self-pay

## 2020-02-23 ENCOUNTER — Encounter: Payer: Self-pay | Admitting: Internal Medicine

## 2020-02-23 ENCOUNTER — Ambulatory Visit (INDEPENDENT_AMBULATORY_CARE_PROVIDER_SITE_OTHER): Payer: Medicare HMO | Admitting: Internal Medicine

## 2020-02-23 VITALS — BP 132/80 | HR 86 | Wt 185.2 lb

## 2020-02-23 DIAGNOSIS — R413 Other amnesia: Secondary | ICD-10-CM | POA: Insufficient documentation

## 2020-02-23 DIAGNOSIS — M542 Cervicalgia: Secondary | ICD-10-CM | POA: Insufficient documentation

## 2020-02-23 DIAGNOSIS — E119 Type 2 diabetes mellitus without complications: Secondary | ICD-10-CM

## 2020-02-23 NOTE — Progress Notes (Signed)
Patient ID: Bob Brewer, male   DOB: [birth year], 79 y.o.   MRN: [id number]    Established Patient Office Visit  Subjective:  Patient ID: Bob Brewer, male    DOB: [birth year]  Age: 79 y.o. MRN: [id number]  CC:  Chief Complaint  Patient presents with  . radiolgy result    patient here today for results of MRI brain     HPI  Bob Brewer presents to discuss the results of his head MRI from 02/17/2020 that was completed to evaluate his memory loss. The patient is accompanied by his wife, who says that Aricept is not helping. He has not seen a neurologist for this problems. He reports that he also has neck pain that began a few months ago when he turned his head too fast. He has headaches every night and every morning, for which he takes aspirin and advil. He also has bruises on his arms and hands from bumping into things.  02/17/2020 MRI head without contrast revealed: "1. No acute intracranial abnormality. 2. Moderate chronic small vessel ischemic disease and cerebral atrophy. 3. 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 Electronically Signed By: Logan Bores M.D. On: 02/17/2020 10:38"  Past Medical History:  Diagnosis Date  . Diabetes mellitus without complication ([place])   . Hypertension     History reviewed. No pertinent surgical history.  History reviewed. No pertinent family history.  Social History   Socioeconomic History  . Marital status: Married    Spouse name: Not on file  . Number of children: Not on file  . Years of education: Not on file  . Highest education level: Not on file  Occupational History  . Not on file  Tobacco Use  . Smoking status: Former Research scientist (life sciences)  . Smokeless tobacco: Never Used  Substance and Sexual Activity  . Alcohol use: Not Currently  . Drug use: Never  . Sexual activity: Not on file  Other Topics Concern  . Not on file  Social History Narrative  . Not on file   Social Determinants of  Health   Financial Resource Strain:   . Difficulty of Paying Living Expenses:   Food Insecurity:   . Worried About Charity fundraiser in the Last Year:   . Arboriculturist in the Last Year:   Transportation Needs:   . Film/video editor (Medical):   Marland Kitchen Lack of Transportation (Non-Medical):   Physical Activity:   . Days of Exercise per Week:   . Minutes of Exercise per Session:   Stress:   . Feeling of Stress :   Social Connections:   . Frequency of Communication with Friends and Family:   . Frequency of Social Gatherings with Friends and Family:   . Attends Religious Services:   . Active Member of Clubs or Organizations:   . Attends Archivist Meetings:   Marland Kitchen Marital Status:   Intimate Partner Violence:   . Fear of Current or Ex-Partner:   . Emotionally Abused:   Marland Kitchen Physically Abused:   . Sexually Abused:      Current Outpatient Medications:  .  Alcohol Swabs (ALCOHOL PREP) PADS, 1 each by Does not apply route daily., Disp: 300 each, Rfl: 3 .  Blood Glucose Monitoring Suppl (TRUE METRIX AIR GLUCOSE METER) DEVI, Use to check blood sugar daily, Disp: 1 each, Rfl: 6 .  donepezil (ARICEPT) 10 MG tablet, Take 1 tablet (10 mg total) by mouth daily., Disp: 90  tablet, Rfl: 3 .  finasteride (PROSCAR) 5 MG tablet, Take 1 tablet (5 mg total) by mouth daily., Disp: 90 tablet, Rfl: 3 .  glucose blood (RELION TRUE METRIX TEST STRIPS) test strip, Use as instructed, Disp: 300 each, Rfl: 3 .  hydrochlorothiazide (HYDRODIURIL) 25 MG tablet, Take 1 tablet (25 mg total) by mouth daily., Disp: 90 tablet, Rfl: 3 .  Lancets 33G MISC, Check blood sugar daily, Disp: 300 each, Rfl: 3 .  lisinopril (ZESTRIL) 40 MG tablet, Take 1 tablet (40 mg total) by mouth daily., Disp: 90 tablet, Rfl: 3 .  ondansetron (ZOFRAN ODT) 4 MG disintegrating tablet, Take 1 tablet (4 mg total) by mouth every 8 (eight) hours as needed for nausea or vomiting., Disp: 20 tablet, Rfl: 0 .  pravastatin (PRAVACHOL) 40 MG  tablet, Take 1 tablet (40 mg total) by mouth daily., Disp: 90 tablet, Rfl: 3   No Known Allergies  ROS Review of Systems  Constitutional: Negative.   HENT: Negative.   Eyes: Negative.   Respiratory: Negative.   Cardiovascular: Negative.   Gastrointestinal: Negative.   Endocrine: Negative.   Genitourinary: Negative.   Musculoskeletal: Positive for neck pain.  Skin: Negative.   Allergic/Immunologic: Negative.   Neurological: Positive for headaches.       Reports loss of memory  Hematological: Bruises/bleeds easily.  Psychiatric/Behavioral: Negative.       Objective:    Physical Exam  Constitutional: The patient is oriented to person, place, and time. Pt appears well-developed and well-nourished.  Head: Normocephalic and atraumatic.  Eyes: Pupils are equal, round, and reactive to light.  Neck: No JVD present. No tracheal deviation present. No thyromegaly present.  Cardiovascular: Regular rate and rhythm. No gallop. Pulmonary/Chest: Normal breath sounds. Lungs clear to auscultation. Abdominal: No abdominal tenderness. No guarding or rebound tenderness.. Musculoskeletal: Normal range of motion.  Lymphatic: No cervical adenopathy.  Neurological: No cranial nerve deficit.  Skin: Skin is warm and hydrated.  Psychiatric: The patient has a normal mood and affect.  BP 132/80   Pulse 86   Wt 185 lb 3.2 oz (84 kg)   BMI 28.16 kg/m  Wt Readings from Last 3 Encounters:  02/23/20 185 lb 3.2 oz (84 kg)  12/29/17 185 lb (83.9 kg)     Health Maintenance Due  Topic Date Due  . Hepatitis C Screening  Never done  . COVID-19 Vaccine (1) Never done  . TETANUS/TDAP  Never done  . PNA vac Low Risk Adult (1 of 2 - PCV13) Never done    There are no preventive care reminders to display for this patient.  No results found for: TSH Lab Results  Component Value Date   WBC 12.2 (H) 12/29/2017   HGB 12.8 (L) 12/29/2017   HCT 36.8 (L) 12/29/2017   MCV 92.5 12/29/2017   PLT 275  12/29/2017   Lab Results  Component Value Date   NA 136 12/29/2017   K 3.1 (L) 12/29/2017   CO2 24 12/29/2017   GLUCOSE 161 (H) 12/29/2017   BUN 22 (H) 12/29/2017   CREATININE 1.55 (H) 12/29/2017   BILITOT 0.6 12/29/2017   ALKPHOS 61 12/29/2017   AST 23 12/29/2017   ALT 17 12/29/2017   PROT 6.9 12/29/2017   ALBUMIN 3.8 12/29/2017   CALCIUM 8.5 (L) 12/29/2017   ANIONGAP 7 12/29/2017   No results found for: CHOL No results found for: HDL No results found for: LDLCALC No results found for: TRIG No results found for: CHOLHDL No results found  for: HGBA1C    Assessment & Plan:   Problem List Items Addressed This Visit      Other   Spine pain, cervical   Memory deficit - Primary    Other Visit Diagnoses    Type 2 diabetes mellitus without complication, without long-term current use of insulin ([place])          No orders of the defined types were placed in this encounter.  1. Memory deficit MRI of the brain did not reveal any masses, encephalomalacia was noted  2. Spine pain, cervical No abnormality was seen of the cervical spine with MRI.  3. Type 2 diabetes mellitus without complication, without long-term current use of insulin (HCC) Sugar is stable. Follow-up: Return in about 3 months (around 05/25/2020).   By signing my name below, I, De Burrs, attest that this documentation has been prepared under the direction and in the presence of Cletis Athens, MD. Electronically Signed: De Burrs, Medical Scribe. 02/23/20. 3:45 PM. I personally performed the services described in this documentation, which was SCRIBED in my presence. The recorded information has been reviewed and considered accurate. It has been edited as necessary during review. Cletis Athens, MD

## 2020-05-26 ENCOUNTER — Ambulatory Visit: Payer: Medicare HMO | Admitting: Internal Medicine

## 2020-07-15 ENCOUNTER — Ambulatory Visit (INDEPENDENT_AMBULATORY_CARE_PROVIDER_SITE_OTHER): Payer: Medicare HMO | Admitting: Family Medicine

## 2020-07-15 ENCOUNTER — Other Ambulatory Visit: Payer: Self-pay

## 2020-07-15 ENCOUNTER — Encounter: Payer: Self-pay | Admitting: Family Medicine

## 2020-07-15 VITALS — BP 155/95 | HR 99 | Ht 66.0 in | Wt 181.8 lb

## 2020-07-15 DIAGNOSIS — E1169 Type 2 diabetes mellitus with other specified complication: Secondary | ICD-10-CM | POA: Diagnosis not present

## 2020-07-15 DIAGNOSIS — Z0000 Encounter for general adult medical examination without abnormal findings: Secondary | ICD-10-CM | POA: Diagnosis not present

## 2020-07-15 DIAGNOSIS — E119 Type 2 diabetes mellitus without complications: Secondary | ICD-10-CM | POA: Insufficient documentation

## 2020-07-15 DIAGNOSIS — Z23 Encounter for immunization: Secondary | ICD-10-CM | POA: Diagnosis not present

## 2020-07-15 DIAGNOSIS — S40812A Abrasion of left upper arm, initial encounter: Secondary | ICD-10-CM

## 2020-07-15 DIAGNOSIS — R413 Other amnesia: Secondary | ICD-10-CM | POA: Diagnosis not present

## 2020-07-15 DIAGNOSIS — E0843 Diabetes mellitus due to underlying condition with diabetic autonomic (poly)neuropathy: Secondary | ICD-10-CM

## 2020-07-15 DIAGNOSIS — I4811 Longstanding persistent atrial fibrillation: Secondary | ICD-10-CM

## 2020-07-15 LAB — GLUCOSE, POCT (MANUAL RESULT ENTRY): POC Glucose: 145 mg/dl — AB (ref 70–99)

## 2020-07-15 NOTE — Progress Notes (Signed)
Established Patient Office Visit  SUBJECTIVE:  Subjective  Patient ID: Bob Brewer, male    DOB: [birth year]  Age: 79 y.o. MRN: [id number]  CC:  Chief Complaint  Patient presents with   Annual Exam    HPI Bob Brewer is a 79 y.o. male presenting today for Annual Exam, Memory is worsening, medication not helping, DM controlled with R foot numbness.   Past Medical History:  Diagnosis Date   Diabetes mellitus without complication ([place])    Hypertension     History reviewed. No pertinent surgical history.  History reviewed. No pertinent family history.  Social History   Socioeconomic History   Marital status: Married    Spouse name: Not on file   Number of children: Not on file   Years of education: Not on file   Highest education level: Not on file  Occupational History   Not on file  Tobacco Use   Smoking status: Former Smoker   Smokeless tobacco: Never Used  Substance and Sexual Activity   Alcohol use: Not Currently   Drug use: Never   Sexual activity: Not on file  Other Topics Concern   Not on file  Social History Narrative   Not on file   Social Determinants of Health   Financial Resource Strain:    Difficulty of Paying Living Expenses: Not on file  Food Insecurity:    Worried About [place] in the Last Year: Not on file   Ran Out of Food in the Last Year: Not on file  Transportation Needs:    Lack of Transportation (Medical): Not on file   Lack of Transportation (Non-Medical): Not on file  Physical Activity:    Days of Exercise per Week: Not on file   Minutes of Exercise per Session: Not on file  Stress:    Feeling of Stress : Not on file  Social Connections:    Frequency of Communication with Friends and Family: Not on file   Frequency of Social Gatherings with Friends and Family: Not on file   Attends Religious Services: Not on file   Active Member of Clubs or Organizations: Not on file   Attends  Archivist Meetings: Not on file   Marital Status: Not on file  Intimate Partner Violence:    Fear of Current or Ex-Partner: Not on file   Emotionally Abused: Not on file   Physically Abused: Not on file   Sexually Abused: Not on file     Current Outpatient Medications:    Alcohol Swabs (ALCOHOL PREP) PADS, 1 each by Does not apply route daily., Disp: 300 each, Rfl: 3   Blood Glucose Monitoring Suppl (TRUE METRIX AIR GLUCOSE METER) DEVI, Use to check blood sugar daily, Disp: 1 each, Rfl: 6   donepezil (ARICEPT) 10 MG tablet, Take 1 tablet (10 mg total) by mouth daily., Disp: 90 tablet, Rfl: 3   finasteride (PROSCAR) 5 MG tablet, Take 1 tablet (5 mg total) by mouth daily., Disp: 90 tablet, Rfl: 3   glipiZIDE (GLUCOTROL) 10 MG tablet, Take 10 mg by mouth daily before breakfast. Patient is unsure of strength, asked pt to call back with this information, Disp: , Rfl:    glucose blood (RELION TRUE METRIX TEST STRIPS) test strip, Use as instructed, Disp: 300 each, Rfl: 3   hydrochlorothiazide (HYDRODIURIL) 25 MG tablet, Take 1 tablet (25 mg total) by mouth daily., Disp: 90 tablet, Rfl: 3   Lancets 33G MISC, Check blood sugar  daily, Disp: 300 each, Rfl: 3   lisinopril (ZESTRIL) 40 MG tablet, Take 1 tablet (40 mg total) by mouth daily., Disp: 90 tablet, Rfl: 3   metFORMIN (GLUCOPHAGE) 500 MG tablet, Take 500 mg by mouth daily., Disp: , Rfl:    omeprazole (PRILOSEC) 20 MG capsule, Take 20 mg by mouth daily., Disp: , Rfl:    pravastatin (PRAVACHOL) 40 MG tablet, Take 1 tablet (40 mg total) by mouth daily., Disp: 90 tablet, Rfl: 3   tamsulosin (FLOMAX) 0.4 MG CAPS capsule, Take 0.4 mg by mouth daily., Disp: , Rfl:    No Known Allergies  ROS Review of Systems  HENT: Negative.   Respiratory: Negative.   Cardiovascular:       Irreg HR  Skin: Positive for wound.  Psychiatric/Behavioral: Positive for confusion.     OBJECTIVE:    Physical Exam Constitutional:       Appearance: Normal appearance.  HENT:     Head: Normocephalic.     Right Ear: Tympanic membrane normal.     Left Ear: Tympanic membrane normal.     Nose: Nose normal.     Mouth/Throat:     Mouth: Mucous membranes are moist.  Eyes:     Pupils: Pupils are equal, round, and reactive to light.  Cardiovascular:     Rate and Rhythm: Rhythm irregular.  Pulmonary:     Effort: Pulmonary effort is normal.  Abdominal:     General: Abdomen is flat.  Musculoskeletal:        General: Normal range of motion.     Cervical back: Normal range of motion.     BP (!) 155/95    Pulse 99    Ht 5\' 6"  (1.676 m)    Wt 181 lb 12.8 oz (82.5 kg)    BMI 29.34 kg/m  Wt Readings from Last 3 Encounters:  07/15/20 181 lb 12.8 oz (82.5 kg)  02/23/20 185 lb 3.2 oz (84 kg)  12/29/17 185 lb (83.9 kg)    Health Maintenance Due  Topic Date Due   HEMOGLOBIN A1C  Never done   Hepatitis C Screening  Never done   FOOT EXAM  Never done   OPHTHALMOLOGY EXAM  Never done   TETANUS/TDAP  Never done   PNA vac Low Risk Adult (1 of 2 - PCV13) Never done    There are no preventive care reminders to display for this patient.  CBC Latest Ref Rng & Units 12/29/2017 06/15/2012  WBC 3.8 - 10.6 K/uL 12.2(H) 11.5(H)  Hemoglobin 13.0 - 18.0 g/dL 12.8(L) 13.8  Hematocrit 40 - 52 % 36.8(L) 42.3  Platelets 150 - 440 K/uL 275 285   CMP Latest Ref Rng & Units 12/29/2017 06/15/2012  Glucose 65 - 99 mg/dL 161(H) 153(H)  BUN 6 - 20 mg/dL 22(H) 15  Creatinine 0.61 - 1.24 mg/dL 1.55(H) 1.22  Sodium 135 - 145 mmol/L 136 142  Potassium 3.5 - 5.1 mmol/L 3.1(L) 3.4(L)  Chloride 101 - 111 mmol/L 105 104  CO2 22 - 32 mmol/L 24 26  Calcium 8.9 - 10.3 mg/dL 8.5(L) 8.9  Total Protein 6.5 - 8.1 g/dL 6.9 7.7  Total Bilirubin 0.3 - 1.2 mg/dL 0.6 0.3  Alkaline Phos 38 - 126 U/L 61 85  AST 15 - 41 U/L 23 20  ALT 17 - 63 U/L 17 33    No results found for: TSH Lab Results  Component Value Date   ALBUMIN 3.8 12/29/2017   ANIONGAP 7  12/29/2017   No  results found for: CHOL, HDL, LDLCALC, CHOLHDL No results found for: TRIG No results found for: HGBA1C    ASSESSMENT & PLAN:   Problem List Items Addressed This Visit      Cardiovascular and Mediastinum   Longstanding persistent atrial fibrillation ([place])    ECG without changes today, no report of HR over 100 or CP or SOB.         Endocrine   Diabetes mellitus due to underlying condition with diabetic autonomic neuropathy, without long-term current use of insulin (HCC)    Diabetes mellitus Type II, under good control.. Discussed general issues about diabetes pathophysiology and management. Counseling at today's visit: no counseling performed.       Relevant Medications   metFORMIN (GLUCOPHAGE) 500 MG tablet   glipiZIDE (GLUCOTROL) 10 MG tablet     Musculoskeletal and Integument   Abrasion of left arm - Primary    Dime size abrasion left forearm after minor scrape, will give TDAp today.         Other   Memory deficit    Worsening memory deficit per pt and spouse, Remembered 1/3 words with normal clock drawing. Sending to Neurology, MRI complete.       Annual physical exam    Last Eye exam last Year Colonoscopy- Over 60  TDAP- Unsure of this will give today due to skin abrasion.          No orders of the defined types were placed in this encounter.     Follow-up: No follow-ups on file.    Beckie Salts, [place] [address] [place], [place] [address]

## 2020-07-15 NOTE — Assessment & Plan Note (Addendum)
Last Eye exam last Year Colonoscopy- Over 29  TDAP- Unsure of this will give today due to skin abrasion.  PSA- Unsure last date will do today.

## 2020-07-15 NOTE — Assessment & Plan Note (Signed)
Worsening memory deficit per pt and spouse, Remembered 1/3 words with normal clock drawing. Sending to Neurology, MRI complete.

## 2020-07-15 NOTE — Assessment & Plan Note (Signed)
ECG without changes today, no report of HR over 100 or CP or SOB.

## 2020-07-15 NOTE — Assessment & Plan Note (Signed)
Dime size abrasion left forearm after minor scrape, will give TDAp today.

## 2020-07-15 NOTE — Assessment & Plan Note (Signed)
Diabetes mellitus Type II, under good control.. Discussed general issues about diabetes pathophysiology and management. Counseling at today's visit: no counseling performed.

## 2020-07-16 LAB — CBC WITH DIFFERENTIAL/PLATELET
Absolute Monocytes: 773 cells/uL (ref 200–950)
Basophils Absolute: 53 cells/uL (ref 0–200)
Basophils Relative: 0.7 %
Eosinophils Absolute: 353 cells/uL (ref 15–500)
Eosinophils Relative: 4.7 %
HCT: 39.2 % (ref 38.5–50.0)
Hemoglobin: 13 g/dL — ABNORMAL LOW (ref 13.2–17.1)
Lymphs Abs: 2048 cells/uL (ref 850–3900)
MCH: 31.7 pg (ref 27.0–33.0)
MCHC: 33.2 g/dL (ref 32.0–36.0)
MCV: 95.6 fL (ref 80.0–100.0)
MPV: 11 fL (ref 7.5–12.5)
Monocytes Relative: 10.3 %
Neutro Abs: 4275 cells/uL (ref 1500–7800)
Neutrophils Relative %: 57 %
Platelets: 262 10*3/uL (ref 140–400)
RBC: 4.1 10*6/uL — ABNORMAL LOW (ref 4.20–5.80)
RDW: 12.7 % (ref 11.0–15.0)
Total Lymphocyte: 27.3 %
WBC: 7.5 10*3/uL (ref 3.8–10.8)

## 2020-07-16 LAB — COMPLETE METABOLIC PANEL WITH GFR
AG Ratio: 1.5 (calc) (ref 1.0–2.5)
ALT: 12 U/L (ref 9–46)
AST: 16 U/L (ref 10–35)
Albumin: 4 g/dL (ref 3.6–5.1)
Alkaline phosphatase (APISO): 72 U/L (ref 35–144)
BUN/Creatinine Ratio: 15 (calc) (ref 6–22)
BUN: 23 mg/dL (ref 7–25)
CO2: 22 mmol/L (ref 20–32)
Calcium: 9.2 mg/dL (ref 8.6–10.3)
Chloride: 106 mmol/L (ref 98–110)
Creat: 1.58 mg/dL — ABNORMAL HIGH (ref 0.70–1.18)
GFR, Est African American: 48 mL/min/{1.73_m2} — ABNORMAL LOW (ref >=60)
GFR, Est Non African American: 41 mL/min/{1.73_m2} — ABNORMAL LOW (ref >=60)
Globulin: 2.7 g/dL (calc) (ref 1.9–3.7)
Glucose, Bld: 155 mg/dL — ABNORMAL HIGH (ref 65–99)
Potassium: 4.2 mmol/L (ref 3.5–5.3)
Sodium: 140 mmol/L (ref 135–146)
Total Bilirubin: 0.5 mg/dL (ref 0.2–1.2)
Total Protein: 6.7 g/dL (ref 6.1–8.1)

## 2020-07-16 LAB — HEMOGLOBIN A1C
Hgb A1c MFr Bld: 8 % of total Hgb — ABNORMAL HIGH (ref <5.7)
Mean Plasma Glucose: 183 (calc)
eAG (mmol/L): 10.1 (calc)

## 2020-07-16 LAB — SPECIMEN COMPROMISED

## 2020-07-16 LAB — PSA: PSA: 3.37 ng/mL (ref <=4.0)

## 2020-07-29 ENCOUNTER — Ambulatory Visit: Payer: Medicare HMO | Admitting: Family Medicine

## 2020-08-02 ENCOUNTER — Other Ambulatory Visit: Payer: Self-pay

## 2020-08-02 ENCOUNTER — Ambulatory Visit (INDEPENDENT_AMBULATORY_CARE_PROVIDER_SITE_OTHER): Payer: Medicare HMO | Admitting: Internal Medicine

## 2020-08-02 ENCOUNTER — Encounter: Payer: Self-pay | Admitting: Internal Medicine

## 2020-08-02 VITALS — BP 122/80 | HR 71 | Ht 67.0 in | Wt 177.5 lb

## 2020-08-02 DIAGNOSIS — I10 Essential (primary) hypertension: Secondary | ICD-10-CM | POA: Diagnosis not present

## 2020-08-02 DIAGNOSIS — E1169 Type 2 diabetes mellitus with other specified complication: Secondary | ICD-10-CM | POA: Diagnosis not present

## 2020-08-02 DIAGNOSIS — M47812 Spondylosis without myelopathy or radiculopathy, cervical region: Secondary | ICD-10-CM | POA: Diagnosis not present

## 2020-08-02 DIAGNOSIS — R413 Other amnesia: Secondary | ICD-10-CM | POA: Diagnosis not present

## 2020-08-02 MED ORDER — MELOXICAM 7.5 MG PO TABS: 7.5000 mg | ORAL_TABLET | Freq: Every day | ORAL | 0 refills | Status: DC

## 2020-08-02 NOTE — Assessment & Plan Note (Signed)
Blood pressure is stable on the present medication 

## 2020-08-02 NOTE — Assessment & Plan Note (Signed)
Blood sugar is under control ?

## 2020-08-02 NOTE — Assessment & Plan Note (Addendum)
Patient memory deficit is stable at the present time.  He does not have any evidence of dementia depression hallucination or delusions.

## 2020-08-02 NOTE — Progress Notes (Signed)
Established Patient Office Visit  Subjective:  Patient ID: Bob Brewer, male    DOB: 08-Jun-1941  Age: 79 y.o. MRN: [id number]  CC:  Chief Complaint  Patient presents with  . Neck Pain    Neck Pain  This is a new problem. The current episode started in the past 7 days. The problem occurs constantly. The problem has been unchanged. The pain is present in the midline. The quality of the pain is described as aching. The pain is at a severity of 7/10. The pain is moderate. The symptoms are aggravated by bending. Pertinent negatives include no chest pain, fever, headaches, leg pain, pain with swallowing, syncope, trouble swallowing or weakness. He has tried acetaminophen and home exercises for the symptoms. The treatment provided no relief.    Bob Brewer presents for neck pain  Past Medical History:  Diagnosis Date  . Diabetes mellitus without complication ([place])   . Hypertension     History reviewed. No pertinent surgical history.  History reviewed. No pertinent family history.  Social History   Socioeconomic History  . Marital status: Married    Spouse name: Not on file  . Number of children: Not on file  . Years of education: Not on file  . Highest education level: Not on file  Occupational History  . Not on file  Tobacco Use  . Smoking status: Former Research scientist (life sciences)  . Smokeless tobacco: Never Used  Substance and Sexual Activity  . Alcohol use: Not Currently  . Drug use: Never  . Sexual activity: Not on file  Other Topics Concern  . Not on file  Social History Narrative  . Not on file   Social Determinants of Health   Financial Resource Strain:   . Difficulty of Paying Living Expenses: Not on file  Food Insecurity:   . Worried About Charity fundraiser in the Last Year: Not on file  . Ran Out of Food in the Last Year: Not on file  Transportation Needs:   . Lack of Transportation (Medical): Not on file  . Lack of Transportation (Non-Medical): Not on file    Physical Activity:   . Days of Exercise per Week: Not on file  . Minutes of Exercise per Session: Not on file  Stress:   . Feeling of Stress : Not on file  Social Connections:   . Frequency of Communication with Friends and Family: Not on file  . Frequency of Social Gatherings with Friends and Family: Not on file  . Attends Religious Services: Not on file  . Active Member of Clubs or Organizations: Not on file  . Attends Archivist Meetings: Not on file  . Marital Status: Not on file  Intimate Partner Violence:   . Fear of Current or Ex-Partner: Not on file  . Emotionally Abused: Not on file  . Physically Abused: Not on file  . Sexually Abused: Not on file     Current Outpatient Medications:  .  Alcohol Swabs (ALCOHOL PREP) PADS, 1 each by Does not apply route daily., Disp: 300 each, Rfl: 3 .  Blood Glucose Monitoring Suppl (TRUE METRIX AIR GLUCOSE METER) DEVI, Use to check blood sugar daily, Disp: 1 each, Rfl: 6 .  donepezil (ARICEPT) 10 MG tablet, Take 1 tablet (10 mg total) by mouth daily., Disp: 90 tablet, Rfl: 3 .  finasteride (PROSCAR) 5 MG tablet, Take 1 tablet (5 mg total) by mouth daily., Disp: 90 tablet, Rfl: 3 .  glipiZIDE (GLUCOTROL) 10  MG tablet, Take 10 mg by mouth daily before breakfast. Patient is unsure of strength, asked pt to call back with this information, Disp: , Rfl:  .  glucose blood (RELION TRUE METRIX TEST STRIPS) test strip, Use as instructed, Disp: 300 each, Rfl: 3 .  hydrochlorothiazide (HYDRODIURIL) 25 MG tablet, Take 1 tablet (25 mg total) by mouth daily., Disp: 90 tablet, Rfl: 3 .  Lancets 33G MISC, Check blood sugar daily, Disp: 300 each, Rfl: 3 .  lisinopril (ZESTRIL) 40 MG tablet, Take 1 tablet (40 mg total) by mouth daily., Disp: 90 tablet, Rfl: 3 .  metFORMIN (GLUCOPHAGE) 500 MG tablet, Take 500 mg by mouth daily., Disp: , Rfl:  .  omeprazole (PRILOSEC) 20 MG capsule, Take 20 mg by mouth daily., Disp: , Rfl:  .  pravastatin (PRAVACHOL)  40 MG tablet, Take 1 tablet (40 mg total) by mouth daily., Disp: 90 tablet, Rfl: 3 .  tamsulosin (FLOMAX) 0.4 MG CAPS capsule, Take 0.4 mg by mouth daily., Disp: , Rfl:  .  meloxicam (MOBIC) 7.5 MG tablet, Take 1 tablet (7.5 mg total) by mouth daily., Disp: 30 tablet, Rfl: 0   No Known Allergies  ROS Review of Systems  Constitutional: Negative.  Negative for fever.  HENT: Negative.  Negative for trouble swallowing.   Eyes: Negative.   Respiratory: Negative.   Cardiovascular: Negative.  Negative for chest pain and syncope.  Gastrointestinal: Negative.   Endocrine: Negative.   Genitourinary: Negative.   Musculoskeletal: Positive for neck pain.  Skin: Negative.   Allergic/Immunologic: Negative.   Neurological: Negative.  Negative for weakness and headaches.  Hematological: Negative.   Psychiatric/Behavioral: Negative.   All other systems reviewed and are negative.     Objective:    Physical Exam Vitals reviewed.  Constitutional:      Appearance: Normal appearance.  HENT:     Mouth/Throat:     Mouth: Mucous membranes are moist.  Eyes:     Pupils: Pupils are equal, round, and reactive to light.  Neck:     Vascular: No carotid bruit.  Cardiovascular:     Rate and Rhythm: Normal rate and regular rhythm.     Pulses: Normal pulses.     Heart sounds: Normal heart sounds.  Pulmonary:     Effort: Pulmonary effort is normal.     Breath sounds: Normal breath sounds.  Abdominal:     General: Bowel sounds are normal.     Palpations: Abdomen is soft. There is no hepatomegaly, splenomegaly or mass.     Tenderness: There is no abdominal tenderness.     Hernia: No hernia is present.  Musculoskeletal:        General: Tenderness present.     Cervical back: Neck supple.     Right lower leg: No edema.     Left lower leg: No edema.     Comments: There is a stiffness and tenderness of the neck.  With limited motion  Skin:    Findings: No rash.  Neurological:     General: No focal  deficit present.     Mental Status: He is alert and oriented to person, place, and time.     Motor: No weakness.  Psychiatric:        Mood and Affect: Mood normal.        Behavior: Behavior normal.     BP 122/80   Pulse 71   Ht 5\' 7"  (1.702 m)   Wt 177 lb 8 oz (80.5 kg)  BMI 27.80 kg/m  Wt Readings from Last 3 Encounters:  08/02/20 177 lb 8 oz (80.5 kg)  07/15/20 181 lb 12.8 oz (82.5 kg)  02/23/20 185 lb 3.2 oz (84 kg)     Health Maintenance Due  Topic Date Due  . Hepatitis C Screening  Never done  . FOOT EXAM  Never done  . OPHTHALMOLOGY EXAM  Never done  . PNA vac Low Risk Adult (1 of 2 - PCV13) Never done    There are no preventive care reminders to display for this patient.  No results found for: TSH Lab Results  Component Value Date   WBC 7.5 07/15/2020   HGB 13.0 (L) 07/15/2020   HCT 39.2 07/15/2020   MCV 95.6 07/15/2020   PLT 262 07/15/2020   Lab Results  Component Value Date   NA 140 07/15/2020   K 4.2 07/15/2020   CO2 22 07/15/2020   GLUCOSE 155 (H) 07/15/2020   BUN 23 07/15/2020   CREATININE 1.58 (H) 07/15/2020   BILITOT 0.5 07/15/2020   ALKPHOS 61 12/29/2017   AST 16 07/15/2020   ALT 12 07/15/2020   PROT 6.7 07/15/2020   ALBUMIN 3.8 12/29/2017   CALCIUM 9.2 07/15/2020   ANIONGAP 7 12/29/2017   No results found for: CHOL No results found for: HDL No results found for: LDLCALC No results found for: TRIG No results found for: CHOLHDL Lab Results  Component Value Date   HGBA1C 8.0 (H) 07/15/2020      Assessment & Plan:   Problem List Items Addressed This Visit      Cardiovascular and Mediastinum   Essential hypertension    Blood pressure is stable on the present medication.        Endocrine   Diabetes mellitus ([place])    Blood sugar is under control.        Musculoskeletal and Integument   Cervical spine arthritis - Primary    Patient complains of pain in the neck he has trouble bending it forward and backwards external  rotation and internal rotation.  He has seen the orthopedic surgeon in the past.  I started him on Mobic 7.5 mg p.o. daily.  Advised him to use neck brace and follow-up with orthopedics.      Relevant Medications   meloxicam (MOBIC) 7.5 MG tablet     Other   Memory deficit    Patient memory deficit is stable at the present time.  He does not have any evidence of dementia depression hallucination or delusions.         Meds ordered this encounter  Medications  . meloxicam (MOBIC) 7.5 MG tablet    Sig: Take 1 tablet (7.5 mg total) by mouth daily.    Dispense:  30 tablet    Refill:  0    Follow-up: No follow-ups on file.    Cletis [place], MD

## 2020-08-02 NOTE — Assessment & Plan Note (Signed)
Patient complains of pain in the neck he has trouble bending it forward and backwards external rotation and internal rotation.  He has seen the orthopedic surgeon in the past.  I started him on Mobic 7.5 mg p.o. daily.  Advised him to use neck brace and follow-up with orthopedics.

## 2020-08-04 NOTE — Addendum Note (Signed)
Addended by: Alois Cliche on: 08/04/2020 10:13 AM   Modules accepted: Orders

## 2020-08-24 ENCOUNTER — Telehealth: Payer: Self-pay | Admitting: Orthopaedic Surgery

## 2020-08-24 ENCOUNTER — Encounter: Payer: Self-pay | Admitting: Orthopedic Surgery

## 2020-08-24 ENCOUNTER — Ambulatory Visit: Payer: Medicare HMO

## 2020-08-24 ENCOUNTER — Other Ambulatory Visit: Payer: Self-pay | Admitting: Orthopedic Surgery

## 2020-08-24 ENCOUNTER — Ambulatory Visit (INDEPENDENT_AMBULATORY_CARE_PROVIDER_SITE_OTHER): Payer: Medicare HMO | Admitting: Orthopedic Surgery

## 2020-08-24 ENCOUNTER — Other Ambulatory Visit: Payer: Self-pay

## 2020-08-24 VITALS — BP 175/104 | HR 89 | Ht 66.0 in | Wt 180.0 lb

## 2020-08-24 DIAGNOSIS — M542 Cervicalgia: Secondary | ICD-10-CM

## 2020-08-24 NOTE — Telephone Encounter (Signed)
Patient came in for an appointment with Dr. Luna Glasgow. After getting him checked in and taken to the back to see the doctor, there was some confusion as to why he was here today.   He and his wife misunderstood what today's visit was for.  They decided to cancel this appointment and they will get with the PCP.

## 2020-10-11 ENCOUNTER — Ambulatory Visit: Payer: Medicare HMO | Admitting: Neurology

## 2020-10-12 ENCOUNTER — Ambulatory Visit: Payer: Medicare HMO | Admitting: Neurology

## 2020-10-15 ENCOUNTER — Ambulatory Visit: Payer: Medicare HMO | Admitting: Family Medicine

## 2020-10-22 ENCOUNTER — Ambulatory Visit: Payer: Medicare Other | Admitting: Neurology

## 2020-10-22 ENCOUNTER — Encounter: Payer: Self-pay | Admitting: Neurology

## 2020-10-22 VITALS — BP 160/89 | HR 78 | Ht 67.0 in | Wt 183.5 lb

## 2020-10-22 DIAGNOSIS — F0280 Dementia in other diseases classified elsewhere without behavioral disturbance: Secondary | ICD-10-CM

## 2020-10-22 DIAGNOSIS — R413 Other amnesia: Secondary | ICD-10-CM

## 2020-10-22 DIAGNOSIS — E1169 Type 2 diabetes mellitus with other specified complication: Secondary | ICD-10-CM

## 2020-10-22 DIAGNOSIS — G309 Alzheimer's disease, unspecified: Secondary | ICD-10-CM | POA: Diagnosis not present

## 2020-10-22 DIAGNOSIS — E559 Vitamin D deficiency, unspecified: Secondary | ICD-10-CM | POA: Diagnosis not present

## 2020-10-22 MED ORDER — MEMANTINE HCL 10 MG PO TABS: ORAL_TABLET | ORAL | 11 refills | Status: DC

## 2020-10-22 NOTE — Progress Notes (Signed)
[facility]  PATIENT: Bob Brewer DOB: 08/02/1941  REFERRING DOCTOR OR PCP:  Ardis Hughs Masoud SOURCE: Patient, wife, Notes from primary care  _________________________________   HISTORICAL  CHIEF COMPLAINT:  Chief Complaint  Patient presents with  . New Patient (Initial Visit)    "confusion" Room 13, wife Hoyle Sauer in room    HISTORY OF PRESENT ILLNESS:  I had the pleasure seeing your patient, [name], at [facility] for neurologic consultation regarding his memory loss and other cognitive issues.  He is a 80 year old man who first noted memory problems 6 months ago.  He does feel he is having more trouble with memory and also some tasks around the home.  His wife first noted that he was forgetful about 2 years ago.  He would not remember things he recently did.  As an example the day after they went out to eat at a restaurant he did not recall that he had been there.  Additionally, she needed to take over more responsibility for complex tasks - like tasks with the computer or fixing the phone.   More recently she notes he has more trouble coming up with the right names or words.     He started donepezil but it has not improved any of his cognitive symptoms.         He sleeps poorly many nights due to nocturia.   He does seem to quickly fall back asleep most times after he wakes up, however.  He will sometimes takes naps.    He snores but no OSA signs.     He has Type 2 NIDDM (30 years).  He has elevated cholesterol.  No known thyroid dysfunction.  No known deficiencies.  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   [place] and he did a lot of outdoor activities after retirement and then returned around 2001.  He did some fishing but mostly spent time surfing [employer], build computers to resell.   He stopped about 10 years ago and mostly surfs the web or watches TV.     He had an MRI of the brain  02/17/2020 that I personally reviewed.  It shows moderate generalized cortical atrophy that is more severe in the medial temporal lobes.  There is only a minimal amount of age-appropriate chronic microvascular ischemic change.  A pattern like this is consistent with Alzheimer's disease.  MMSE - Mini Mental State Exam 10/22/2020  Orientation to time 3  Orientation to Place 4  Registration 3  Attention/ Calculation 1  Recall 0  Language- name 2 objects 2  Language- repeat 1  Language- follow 3 step command 3  Language- read & follow direction 1  Write a sentence 1  Copy design 1  Total score 20    REVIEW OF SYSTEMS: Constitutional: No fevers, chills, sweats, or change in appetite Eyes: No visual changes, double vision, eye pain Ear, nose and throat: No hearing loss, ear pain, nasal congestion, sore throat Cardiovascular: No chest pain, palpitations Respiratory: No shortness of breath at rest or with exertion.   No wheezes GastrointestinaI: No nausea, vomiting, diarrhea, abdominal pain, fecal incontinence Genitourinary:He has urinary hesitancy helped by Proscar and tamsulosin. Musculoskeletal: No neck pain, back pain Integumentary: No rash, pruritus, skin lesions Neurological: as above Psychiatric: No depression at this time.  No anxiety Endocrine: No palpitations, diaphoresis, change in appetite, change in weigh or increased thirst Hematologic/Lymphatic: No  anemia, purpura, petechiae. Allergic/Immunologic: No itchy/runny eyes, nasal congestion, recent allergic reactions, rashes  ALLERGIES: No Known Allergies  HOME MEDICATIONS:  Current Outpatient Medications:  .  Alcohol Swabs (ALCOHOL PREP) PADS, 1 each by Does not apply route daily., Disp: 300 each, Rfl: 3 .  Blood Glucose Monitoring Suppl (TRUE METRIX AIR GLUCOSE METER) DEVI, Use to check blood sugar daily, Disp: 1 each, Rfl: 6 .  donepezil (ARICEPT) 10 MG tablet, Take 1 tablet (10 mg total) by mouth daily., Disp: 90 tablet,  Rfl: 3 .  finasteride (PROSCAR) 5 MG tablet, Take 1 tablet (5 mg total) by mouth daily., Disp: 90 tablet, Rfl: 3 .  glipiZIDE (GLUCOTROL) 10 MG tablet, Take 10 mg by mouth daily before breakfast. Patient is unsure of strength, asked pt to call back with this information, Disp: , Rfl:  .  glucose blood (RELION TRUE METRIX TEST STRIPS) test strip, Use as instructed, Disp: 300 each, Rfl: 3 .  hydrochlorothiazide (HYDRODIURIL) 25 MG tablet, Take 1 tablet (25 mg total) by mouth daily., Disp: 90 tablet, Rfl: 3 .  Lancets 33G MISC, Check blood sugar daily, Disp: 300 each, Rfl: 3 .  lisinopril (ZESTRIL) 40 MG tablet, Take 1 tablet (40 mg total) by mouth daily., Disp: 90 tablet, Rfl: 3 .  meloxicam (MOBIC) 7.5 MG tablet, Take 1 tablet (7.5 mg total) by mouth daily., Disp: 30 tablet, Rfl: 0 .  metFORMIN (GLUCOPHAGE) 500 MG tablet, Take 500 mg by mouth daily., Disp: , Rfl:  .  omeprazole (PRILOSEC) 20 MG capsule, Take 20 mg by mouth daily., Disp: , Rfl:  .  pravastatin (PRAVACHOL) 40 MG tablet, Take 1 tablet (40 mg total) by mouth daily., Disp: 90 tablet, Rfl: 3 .  tamsulosin (FLOMAX) 0.4 MG CAPS capsule, Take 0.4 mg by mouth daily., Disp: , Rfl:   PAST MEDICAL HISTORY: Past Medical History:  Diagnosis Date  . Diabetes mellitus without complication ([place])   . Hypertension     PAST SURGICAL HISTORY: History reviewed. No pertinent surgical history.  FAMILY HISTORY: History reviewed. No pertinent family history.  SOCIAL HISTORY:  Social History   Socioeconomic History  . Marital status: Married    Spouse name: Not on file  . Number of children: Not on file  . Years of education: Not on file  . Highest education level: Not on file  Occupational History  . Not on file  Tobacco Use  . Smoking status: Former Research scientist (life sciences)  . Smokeless tobacco: Never Used  Substance and Sexual Activity  . Alcohol use: Not Currently  . Drug use: Never  . Sexual activity: Not on file  Other Topics Concern  . Not on  file  Social History Narrative   Life with wife   Right handed   Drinks 5-6 cups caffeine daily   Social Determinants of Health   Financial Resource Strain: Not on file  Food Insecurity: Not on file  Transportation Needs: Not on file  Physical Activity: Not on file  Stress: Not on file  Social Connections: Not on file  Intimate Partner Violence: Not on file     PHYSICAL EXAM  Vitals:   10/22/20 0902  BP: (!) 160/89  Pulse: 78  Weight: 183 lb 8 oz (83.2 kg)  Height: 5\' 7"  (1.702 m)    Body mass index is 28.74 kg/m.   General: The patient is well-developed and well-nourished and in no acute distress  HEENT:  Head is [place]/AT.  Sclera are anicteric.     Neck:  No carotid bruits are noted.  The neck is nontender.  Cardiovascular: The heart has a regular rate and rhythm with a normal S1 and S2. There were no murmurs, gallops or rubs.    Skin: Extremities are without rash or  edema.  Musculoskeletal:  Back is nontender  Neurologic Exam  Mental status: He scored 20/30 on the Mini-Mental Status exam.  Details above.  Short-term memory was 0/3.   Speech is normal.  Cranial nerves: Extraocular movements are full. Pupils are equal, round, and reactive to light and accomodation.  Visual fields are full.  Facial symmetry is present. There is good facial sensation to soft touch bilaterally.Facial strength is normal.  Trapezius and sternocleidomastoid strength is normal. No dysarthria is noted.  The tongue is midline, and the patient has symmetric elevation of the soft palate. No obvious hearing deficits are noted.  Motor:  Muscle bulk is normal.   Tone is normal. Strength is  5 / 5 in all 4 extremities.   Sensory: Sensory testing is intact to pinprick, soft touch and vibration sensation in all 4 extremities.  Coordination: Cerebellar testing reveals good finger-nose-finger and heel-to-shin bilaterally.  Gait and station: Station is normal.   Gait is slightly wide but he can turn  in 3 steps..  Tandem gait is wide.. Romberg is negative.   Reflexes: Deep tendon reflexes are symmetric and normal bilaterally.   Plantar responses are flexor.    DIAGNOSTIC DATA (LABS, IMAGING, TESTING) - I reviewed patient records, labs, notes, testing and imaging myself where available.  Lab Results  Component Value Date   WBC 7.5 07/15/2020   HGB 13.0 (L) 07/15/2020   HCT 39.2 07/15/2020   MCV 95.6 07/15/2020   PLT 262 07/15/2020      Component Value Date/Time   NA 140 07/15/2020 1047   NA 142 06/15/2012 0504   K 4.2 07/15/2020 1047   K 3.4 (L) 06/15/2012 0504   CL 106 07/15/2020 1047   CL 104 06/15/2012 0504   CO2 22 07/15/2020 1047   CO2 26 06/15/2012 0504   GLUCOSE 155 (H) 07/15/2020 1047   GLUCOSE 153 (H) 06/15/2012 0504   BUN 23 07/15/2020 1047   BUN 15 06/15/2012 0504   CREATININE 1.58 (H) 07/15/2020 1047   CALCIUM 9.2 07/15/2020 1047   CALCIUM 8.9 06/15/2012 0504   PROT 6.7 07/15/2020 1047   PROT 7.7 06/15/2012 0504   ALBUMIN 3.8 12/29/2017 1737   ALBUMIN 3.7 06/15/2012 0504   AST 16 07/15/2020 1047   AST 20 06/15/2012 0504   ALT 12 07/15/2020 1047   ALT 33 06/15/2012 0504   ALKPHOS 61 12/29/2017 1737   ALKPHOS 85 06/15/2012 0504   BILITOT 0.5 07/15/2020 1047   BILITOT 0.3 06/15/2012 0504   GFRNONAA 41 (L) 07/15/2020 1047   GFRAA 48 (L) 07/15/2020 1047   No results found for: CHOL, HDL, LDLCALC, LDLDIRECT, TRIG, CHOLHDL Lab Results  Component Value Date   HGBA1C 8.0 (H) 07/15/2020   No results found for: VITAMINB12 No results found for: TSH     ASSESSMENT AND PLAN  Alzheimer disease ([place]) - Plan: TSH, Vitamin B12  Memory deficit - Plan: TSH, Vitamin B12, VITAMIN D 25 Hydroxy (Vit-D Deficiency, Fractures)  Vitamin D deficiency - Plan: VITAMIN D 25 Hydroxy (Vit-D Deficiency, Fractures)  Type 2 diabetes mellitus with other specified complication, without long-term current use of insulin ([place])   In summary, Mr. Flenner is a 80 year old man  with progressive memory decline over the past  2 years.  The history provided by him and his wife, his performance on the Mini-Mental Status exam and the pattern of atrophy on the MRI are all consistent with mild to moderate Alzheimer's disease.  Unfortunately, they do not note any benefit of donepezil.  We did discuss that benefits could be subtle slowing down progression rather than leading to an improvement.  I will add memantine.  I discussed that unfortunately we do not have any great medicines for Alzheimer's disease and if these do not help it is unlikely that other medications will help much either.  He is advised to stay active and eat well.  There are no significant behavioral problems at this time.  If sleep becomes more of an issue consider nighttime melatonin.  They will return to see [place] in 6 months or sooner if there are new or worsening neurologic symptoms.  Thank you for asking me to see Mr. Needle.  Please let me know when we have further assistance with him or other patients in the future.  Kaylynn Chamblin A. Felecia Shelling, MD, [facility] 10/24/4156, 3:09 AM Certified in Neurology, Clinical Neurophysiology, Sleep Medicine and Neuroimaging  [facility] [address] [place], [place] [address] [phone]

## 2020-10-23 LAB — TSH: TSH: 0.853 u[IU]/mL (ref 0.450–4.500)

## 2020-10-23 LAB — VITAMIN B12: Vitamin B-12: 155 pg/mL — ABNORMAL LOW (ref 232–1245)

## 2020-10-23 LAB — VITAMIN D 25 HYDROXY (VIT D DEFICIENCY, FRACTURES): Vit D, 25-Hydroxy: 25.2 ng/mL — ABNORMAL LOW (ref 30.0–100.0)

## 2020-11-01 ENCOUNTER — Telehealth: Payer: Self-pay | Admitting: *Deleted

## 2020-11-01 ENCOUNTER — Encounter: Payer: Self-pay | Admitting: Internal Medicine

## 2020-11-01 ENCOUNTER — Other Ambulatory Visit: Payer: Self-pay

## 2020-11-01 ENCOUNTER — Other Ambulatory Visit: Payer: Self-pay | Admitting: Neurology

## 2020-11-01 ENCOUNTER — Ambulatory Visit (INDEPENDENT_AMBULATORY_CARE_PROVIDER_SITE_OTHER): Payer: Medicare Other | Admitting: Internal Medicine

## 2020-11-01 VITALS — BP 157/102 | HR 66 | Ht 67.0 in | Wt 184.5 lb

## 2020-11-01 DIAGNOSIS — I10 Essential (primary) hypertension: Secondary | ICD-10-CM | POA: Diagnosis not present

## 2020-11-01 DIAGNOSIS — R413 Other amnesia: Secondary | ICD-10-CM

## 2020-11-01 DIAGNOSIS — Z0000 Encounter for general adult medical examination without abnormal findings: Secondary | ICD-10-CM | POA: Diagnosis not present

## 2020-11-01 DIAGNOSIS — E119 Type 2 diabetes mellitus without complications: Secondary | ICD-10-CM

## 2020-11-01 DIAGNOSIS — M47812 Spondylosis without myelopathy or radiculopathy, cervical region: Secondary | ICD-10-CM

## 2020-11-01 DIAGNOSIS — M542 Cervicalgia: Secondary | ICD-10-CM | POA: Diagnosis not present

## 2020-11-01 LAB — GLUCOSE, POCT (MANUAL RESULT ENTRY): POC Glucose: 176 mg/dl — AB (ref 70–99)

## 2020-11-01 MED ORDER — CYANOCOBALAMIN 1000 MCG/ML IJ SOLN: INTRAMUSCULAR | 0 refills | Status: DC

## 2020-11-01 NOTE — Telephone Encounter (Signed)
I sent in the prescription to his drugstore

## 2020-11-01 NOTE — Progress Notes (Signed)
Established Patient Office Visit  Subjective:  Patient ID: Bob Brewer, male    DOB: 08-19-1941  Age: 80 y.o. MRN: [id number]  CC:  Chief Complaint  Patient presents with  . Neck Pain    Patient requesting referral for ortho    HPI  Bob Brewer presents for neck pain  Past Medical History:  Diagnosis Date  . Diabetes mellitus without complication ([place])   . Hypertension     History reviewed. No pertinent surgical history.  History reviewed. No pertinent family history.  Social History   Socioeconomic History  . Marital status: Married    Spouse name: Not on file  . Number of children: Not on file  . Years of education: Not on file  . Highest education level: Not on file  Occupational History  . Not on file  Tobacco Use  . Smoking status: Former Research scientist (life sciences)  . Smokeless tobacco: Never Used  Substance and Sexual Activity  . Alcohol use: Not Currently  . Drug use: Never  . Sexual activity: Not on file  Other Topics Concern  . Not on file  Social History Narrative   Life with wife   Right handed   Drinks 5-6 cups caffeine daily   Social Determinants of Health   Financial Resource Strain: Not on file  Food Insecurity: Not on file  Transportation Needs: Not on file  Physical Activity: Not on file  Stress: Not on file  Social Connections: Not on file  Intimate Partner Violence: Not on file     Current Outpatient Medications:  .  Alcohol Swabs (ALCOHOL PREP) PADS, 1 each by Does not apply route daily., Disp: 300 each, Rfl: 3 .  Blood Glucose Monitoring Suppl (TRUE METRIX AIR GLUCOSE METER) DEVI, Use to check blood sugar daily, Disp: 1 each, Rfl: 6 .  donepezil (ARICEPT) 10 MG tablet, Take 1 tablet (10 mg total) by mouth daily., Disp: 90 tablet, Rfl: 3 .  finasteride (PROSCAR) 5 MG tablet, Take 1 tablet (5 mg total) by mouth daily., Disp: 90 tablet, Rfl: 3 .  glipiZIDE (GLUCOTROL) 10 MG tablet, Take 10 mg by mouth daily before breakfast. Patient is unsure  of strength, asked pt to call back with this information, Disp: , Rfl:  .  glucose blood (RELION TRUE METRIX TEST STRIPS) test strip, Use as instructed, Disp: 300 each, Rfl: 3 .  hydrochlorothiazide (HYDRODIURIL) 25 MG tablet, Take 1 tablet (25 mg total) by mouth daily., Disp: 90 tablet, Rfl: 3 .  Lancets 33G MISC, Check blood sugar daily, Disp: 300 each, Rfl: 3 .  lisinopril (ZESTRIL) 40 MG tablet, Take 1 tablet (40 mg total) by mouth daily., Disp: 90 tablet, Rfl: 3 .  meloxicam (MOBIC) 7.5 MG tablet, Take 1 tablet (7.5 mg total) by mouth daily., Disp: 30 tablet, Rfl: 0 .  memantine (NAMENDA) 10 MG tablet, Take 1/2 pill a day for 1 week, then 1/2 pill twice a day for 1 week then 1 pill po bid, Disp: 60 tablet, Rfl: 11 .  metFORMIN (GLUCOPHAGE) 500 MG tablet, Take 500 mg by mouth daily., Disp: , Rfl:  .  omeprazole (PRILOSEC) 20 MG capsule, Take 20 mg by mouth daily., Disp: , Rfl:  .  pravastatin (PRAVACHOL) 40 MG tablet, Take 1 tablet (40 mg total) by mouth daily., Disp: 90 tablet, Rfl: 3 .  tamsulosin (FLOMAX) 0.4 MG CAPS capsule, Take 0.4 mg by mouth daily., Disp: , Rfl:  .  cyanocobalamin (,VITAMIN B-12,) 1000 MCG/ML injection, Inject  1 mL weekly subcutaneously for 4 weeks then 1 ml monthly.   dispense with #15 3 cc syringes with 1 inch 27-gauge needles., Disp: 15 mL, Rfl: 0 .  VITAMIN D PO, Take 2,000 Units by mouth daily., Disp: , Rfl:    No Known Allergies  ROS Review of Systems  Constitutional: Negative.   HENT: Negative.   Eyes: Negative.   Respiratory: Negative.   Cardiovascular: Negative.   Gastrointestinal: Negative.   Endocrine: Negative.   Genitourinary: Negative.   Musculoskeletal: Positive for arthralgias.       Neck pain  Skin: Negative.   Allergic/Immunologic: Negative.   Neurological: Positive for dizziness and light-headedness. Negative for tremors, seizures, syncope and speech difficulty.  Hematological: Negative.   Psychiatric/Behavioral: Negative.   All other  systems reviewed and are negative.     Objective:    Physical Exam Vitals reviewed.  Constitutional:      Appearance: Normal appearance.  HENT:     Mouth/Throat:     Mouth: Mucous membranes are moist.  Eyes:     Pupils: Pupils are equal, round, and reactive to light.  Neck:     Vascular: No carotid bruit.  Cardiovascular:     Rate and Rhythm: Normal rate and regular rhythm.     Pulses: Normal pulses.     Heart sounds: Normal heart sounds.  Pulmonary:     Effort: Pulmonary effort is normal.     Breath sounds: Normal breath sounds.  Abdominal:     General: Bowel sounds are normal. There is no distension.     Palpations: Abdomen is soft. There is no hepatomegaly, splenomegaly or mass.     Tenderness: There is no abdominal tenderness.     Hernia: No hernia is present.  Musculoskeletal:     Cervical back: Neck supple.     Right lower leg: No edema.     Left lower leg: No edema.     Comments: Neck pain  No radiculopathy  Skin:    General: Skin is dry.     Findings: No rash.  Neurological:     General: No focal deficit present.     Mental Status: He is alert and oriented to person, place, and time.     Motor: No weakness.     Gait: Gait normal.  Psychiatric:        Mood and Affect: Mood normal.        Behavior: Behavior normal.     BP (!) 157/102   Pulse 66   Ht 5\' 7"  (1.702 m)   Wt 184 lb 8 oz (83.7 kg)   BMI 28.90 kg/m  Wt Readings from Last 3 Encounters:  11/01/20 184 lb 8 oz (83.7 kg)  10/22/20 183 lb 8 oz (83.2 kg)  08/24/20 180 lb (81.6 kg)     Health Maintenance Due  Topic Date Due  . Hepatitis C Screening  Never done  . FOOT EXAM  Never done  . OPHTHALMOLOGY EXAM  Never done  . PNA vac Low Risk Adult (1 of 2 - PCV13) Never done  . COVID-19 Vaccine (3 - Booster for Moderna series) 06/05/2020    There are no preventive care reminders to display for this patient.  Lab Results  Component Value Date   TSH 0.853 10/22/2020   Lab Results   Component Value Date   WBC 7.5 07/15/2020   HGB 13.0 (L) 07/15/2020   HCT 39.2 07/15/2020   MCV 95.6 07/15/2020   PLT 262 07/15/2020  Lab Results  Component Value Date   NA 140 07/15/2020   K 4.2 07/15/2020   CO2 22 07/15/2020   GLUCOSE 155 (H) 07/15/2020   BUN 23 07/15/2020   CREATININE 1.58 (H) 07/15/2020   BILITOT 0.5 07/15/2020   ALKPHOS 61 12/29/2017   AST 16 07/15/2020   ALT 12 07/15/2020   PROT 6.7 07/15/2020   ALBUMIN 3.8 12/29/2017   CALCIUM 9.2 07/15/2020   ANIONGAP 7 12/29/2017   No results found for: CHOL No results found for: HDL No results found for: LDLCALC No results found for: TRIG No results found for: CHOLHDL Lab Results  Component Value Date   HGBA1C 8.0 (H) 07/15/2020      Assessment & Plan:   Problem List Items Addressed This Visit      Cardiovascular and Mediastinum   Essential hypertension    - Today, the patient's blood pressure is well managed on med. - The patient will continue the current treatment regimen.  - I encouraged the patient to eat a low-sodium diet to help control blood pressure. - I encouraged the patient to live an active lifestyle and complete activities that increases heart rate to 85% target heart rate at least 5 times per week for one hour.            Endocrine   Diabetes mellitus ([place])      - I encouraged the patient to regularly check blood sugar.  - I encouraged the patient to monitor diet. I encouraged the patient to eat low-carb and low-sugar to help prevent blood sugar spikes.  - I encouraged the patient to continue following their prescribed treatment plan for diabetes - I informed the patient to get help if blood sugar drops below 54mg /dL, or if suddenly have trouble thinking clearly or breathing.          Relevant Orders   POCT glucose (manual entry) (Completed)     Musculoskeletal and Integument   Cervical spine arthritis    Patient was advised to use neck pillow to sleep at night.         Other   Neck pain - Primary   Relevant Orders   Ambulatory referral to Orthopedics   Memory loss    Patient was advised to continue using Aricept, refer to neurology for further follow-up.      Annual physical exam    General physical exam is normal heart is regular chest is clear.  Abdominal soft nontender.  There is no pedal edema no calf tenderness.         No orders of the defined types were placed in this encounter.   Follow-up: No follow-ups on file.    Cletis Athens, MD

## 2020-11-01 NOTE — Telephone Encounter (Signed)
-----   Message from Britt Bottom, MD sent at 10/31/2020 12:54 PM EST ----- The vit B12 was low --- if they think they can do shots I will send in a script for B12 and syringes.  Otehrwise, he should take 1000 mcg daily by mouth OTC and we will recheck B12 level to see if it is rising enough at the next visit  Vit D is also low   should take 2000 units OTC daily

## 2020-11-01 NOTE — Telephone Encounter (Signed)
Called and spoke with pt wife. Relayed results per Dr. Felecia Shelling note. Wife states she is comfortable doing B12 inj, used to do this for her father. Advised we will e-scribe to [facility] on file. She will also start him on Vit D 2000U po qd.

## 2020-11-05 ENCOUNTER — Encounter: Payer: Self-pay | Admitting: Internal Medicine

## 2020-11-05 NOTE — Assessment & Plan Note (Signed)
-   I encouraged the patient to regularly check blood sugar.  - I encouraged the patient to monitor diet. I encouraged the patient to eat low-carb and low-sugar to help prevent blood sugar spikes.  - I encouraged the patient to continue following their prescribed treatment plan for diabetes - I informed the patient to get help if blood sugar drops below 54mg/dL, or if suddenly have trouble thinking clearly or breathing.     

## 2020-11-05 NOTE — Assessment & Plan Note (Signed)
Patient was advised to use neck pillow to sleep at night.

## 2020-11-05 NOTE — Assessment & Plan Note (Signed)
Patient was advised to continue using Aricept, refer to neurology for further follow-up.

## 2020-11-05 NOTE — Assessment & Plan Note (Signed)
General physical exam is normal heart is regular chest is clear.  Abdominal soft nontender.  There is no pedal edema no calf tenderness.

## 2020-11-05 NOTE — Assessment & Plan Note (Signed)
-   Today, the patient's blood pressure is well managed on med. - The patient will continue the current treatment regimen.  - I encouraged the patient to eat a low-sodium diet to help control blood pressure. - I encouraged the patient to live an active lifestyle and complete activities that increases heart rate to 85% target heart rate at least 5 times per week for one hour.

## 2020-11-08 ENCOUNTER — Other Ambulatory Visit: Payer: Medicare Other

## 2020-11-08 ENCOUNTER — Other Ambulatory Visit: Payer: Self-pay | Admitting: Internal Medicine

## 2020-11-15 ENCOUNTER — Other Ambulatory Visit: Payer: Self-pay | Admitting: Neurology

## 2020-12-04 ENCOUNTER — Other Ambulatory Visit: Payer: Self-pay | Admitting: Internal Medicine

## 2020-12-04 DIAGNOSIS — M47812 Spondylosis without myelopathy or radiculopathy, cervical region: Secondary | ICD-10-CM

## 2020-12-23 ENCOUNTER — Other Ambulatory Visit: Payer: Self-pay | Admitting: Neurology

## 2021-01-03 ENCOUNTER — Other Ambulatory Visit: Payer: Self-pay | Admitting: Internal Medicine

## 2021-01-03 DIAGNOSIS — M47812 Spondylosis without myelopathy or radiculopathy, cervical region: Secondary | ICD-10-CM

## 2021-01-19 ENCOUNTER — Encounter: Payer: Self-pay | Admitting: Orthopedic Surgery

## 2021-01-19 NOTE — Progress Notes (Signed)
Patient was not seen in clinic - appointment was canceled on the day of the appointment

## 2021-02-01 ENCOUNTER — Other Ambulatory Visit: Payer: Self-pay | Admitting: Neurology

## 2021-02-06 ENCOUNTER — Other Ambulatory Visit: Payer: Self-pay | Admitting: Internal Medicine

## 2021-02-06 DIAGNOSIS — M47812 Spondylosis without myelopathy or radiculopathy, cervical region: Secondary | ICD-10-CM

## 2021-03-04 ENCOUNTER — Other Ambulatory Visit: Payer: Self-pay | Admitting: Internal Medicine

## 2021-03-04 DIAGNOSIS — M47812 Spondylosis without myelopathy or radiculopathy, cervical region: Secondary | ICD-10-CM

## 2021-03-14 ENCOUNTER — Other Ambulatory Visit: Payer: Self-pay

## 2021-03-14 ENCOUNTER — Encounter: Payer: Self-pay | Admitting: Internal Medicine

## 2021-03-14 ENCOUNTER — Ambulatory Visit (INDEPENDENT_AMBULATORY_CARE_PROVIDER_SITE_OTHER): Payer: Medicare Other | Admitting: Internal Medicine

## 2021-03-14 VITALS — BP 160/100 | HR 79 | Ht 67.0 in | Wt 181.4 lb

## 2021-03-14 DIAGNOSIS — M47812 Spondylosis without myelopathy or radiculopathy, cervical region: Secondary | ICD-10-CM

## 2021-03-14 DIAGNOSIS — E1169 Type 2 diabetes mellitus with other specified complication: Secondary | ICD-10-CM

## 2021-03-14 DIAGNOSIS — R413 Other amnesia: Secondary | ICD-10-CM | POA: Diagnosis not present

## 2021-03-14 DIAGNOSIS — I10 Essential (primary) hypertension: Secondary | ICD-10-CM | POA: Diagnosis not present

## 2021-03-14 DIAGNOSIS — I4811 Longstanding persistent atrial fibrillation: Secondary | ICD-10-CM

## 2021-03-14 LAB — GLUCOSE, POCT (MANUAL RESULT ENTRY): POC Glucose: 189 mg/dl — AB (ref 70–99)

## 2021-03-14 MED ORDER — APIXABAN 2.5 MG PO TABS: 2.5000 mg | ORAL_TABLET | Freq: Two times a day (BID) | ORAL | 4 refills | Status: DC

## 2021-03-14 MED ORDER — DONEPEZIL HCL 10 MG PO TABS: 10.0000 mg | ORAL_TABLET | Freq: Every day | ORAL | 3 refills | Status: DC

## 2021-03-14 MED ORDER — SPIRONOLACTONE 25 MG PO TABS: 25.0000 mg | ORAL_TABLET | Freq: Every day | ORAL | 3 refills | Status: DC

## 2021-03-14 NOTE — Progress Notes (Signed)
Established Patient Office Visit  Subjective:  Patient ID: Bob Brewer, male    DOB: [birth year]  Age: 80 y.o. MRN: [id number]  CC:  Chief Complaint  Patient presents with   Follow-up    Patient is here for his 3 month follow up    HPI  Bob Brewer presents for follow up, no chest pain   or sob , pt dont smoke or drink  Past Medical History:  Diagnosis Date   Diabetes mellitus without complication ([place])    Hypertension     History reviewed. No pertinent surgical history.  History reviewed. No pertinent family history.  Social History   Socioeconomic History   Marital status: Married    Spouse name: Not on file   Number of children: Not on file   Years of education: Not on file   Highest education level: Not on file  Occupational History   Not on file  Tobacco Use   Smoking status: Former    Pack years: 0.00   Smokeless tobacco: Never  Substance and Sexual Activity   Alcohol use: Not Currently   Drug use: Never   Sexual activity: Not on file  Other Topics Concern   Not on file  Social History Narrative   Life with wife   Right handed   Drinks 5-6 cups caffeine daily   Social Determinants of Health   Financial Resource Strain: Not on file  Food Insecurity: Not on file  Transportation Needs: Not on file  Physical Activity: Not on file  Stress: Not on file  Social Connections: Not on file  Intimate Partner Violence: Not on file     Current Outpatient Medications:    Alcohol Swabs (ALCOHOL PREP) PADS, 1 each by Does not apply route daily., Disp: 300 each, Rfl: 3   apixaban (ELIQUIS) 2.5 MG TABS tablet, Take 1 tablet (2.5 mg total) by mouth 2 (two) times daily., Disp: 60 tablet, Rfl: 4   Blood Glucose Monitoring Suppl (TRUE METRIX AIR GLUCOSE METER) DEVI, Use to check blood sugar daily, Disp: 1 each, Rfl: 6   cyanocobalamin (,VITAMIN B-12,) 1000 MCG/ML injection, Inject 1 ml subcutaneously once a month, Disp: 3 mL, Rfl: 1   finasteride (PROSCAR)  5 MG tablet, Take 1 tablet (5 mg total) by mouth daily., Disp: 90 tablet, Rfl: 3   glipiZIDE (GLUCOTROL) 10 MG tablet, Take 10 mg by mouth daily before breakfast. Patient is unsure of strength, asked pt to call back with this information, Disp: , Rfl:    glucose blood (RELION TRUE METRIX TEST STRIPS) test strip, Use as instructed, Disp: 300 each, Rfl: 3   Lancets 33G MISC, Check blood sugar daily, Disp: 300 each, Rfl: 3   lisinopril (ZESTRIL) 40 MG tablet, Take 1 tablet (40 mg total) by mouth daily., Disp: 90 tablet, Rfl: 3   memantine (NAMENDA) 10 MG tablet, TAKE 1/2 PILL A DAY FOR 1 WEEK, THEN 1/2 PILL TWICE A DAY FOR 1 WEEK THEN 1 PILL BY MOUTH TWICE A DAY, Disp: 180 tablet, Rfl: 3   metFORMIN (GLUCOPHAGE) 500 MG tablet, Take 500 mg by mouth daily., Disp: , Rfl:    omeprazole (PRILOSEC) 20 MG capsule, Take 20 mg by mouth daily., Disp: , Rfl:    pravastatin (PRAVACHOL) 40 MG tablet, Take 1 tablet (40 mg total) by mouth daily., Disp: 90 tablet, Rfl: 3   spironolactone (ALDACTONE) 25 MG tablet, Take 1 tablet (25 mg total) by mouth daily., Disp: 90 tablet, Rfl: 3  tamsulosin (FLOMAX) 0.4 MG CAPS capsule, Take 0.4 mg by mouth daily., Disp: , Rfl:    VITAMIN D PO, Take 2,000 Units by mouth daily., Disp: , Rfl:    donepezil (ARICEPT) 10 MG tablet, Take 1 tablet (10 mg total) by mouth daily., Disp: 90 tablet, Rfl: 3   No Known Allergies  ROS Review of Systems  Constitutional: Negative.   HENT: Negative.    Eyes: Negative.   Respiratory: Negative.    Cardiovascular: Negative.   Gastrointestinal: Negative.   Endocrine: Negative.   Genitourinary: Negative.   Musculoskeletal: Negative.   Skin: Negative.   Allergic/Immunologic: Negative.   Neurological: Negative.   Hematological: Negative.   Psychiatric/Behavioral: Negative.    All other systems reviewed and are negative.    Objective:    Physical Exam Vitals reviewed.  Constitutional:      Appearance: Normal appearance.  HENT:      Mouth/Throat:     Mouth: Mucous membranes are moist.  Eyes:     Pupils: Pupils are equal, round, and reactive to light.  Neck:     Vascular: No carotid bruit.  Cardiovascular:     Rate and Rhythm: Normal rate. Rhythm irregular.     Pulses: Normal pulses.     Heart sounds: Normal heart sounds. No murmur heard.   No gallop.  Pulmonary:     Effort: Pulmonary effort is normal.     Breath sounds: Normal breath sounds.  Abdominal:     General: Bowel sounds are normal.     Palpations: Abdomen is soft. There is no hepatomegaly, splenomegaly or mass.     Tenderness: There is no abdominal tenderness.     Hernia: No hernia is present.  Musculoskeletal:     Cervical back: Neck supple.     Right lower leg: No edema.     Left lower leg: No edema.  Skin:    Findings: No rash.  Neurological:     Mental Status: He is alert and oriented to person, place, and time.     Motor: No weakness.  Psychiatric:        Mood and Affect: Mood normal.        Behavior: Behavior normal.    BP (!) 160/100   Pulse 79   Ht 5\' 7"  (1.702 m)   Wt 181 lb 6.4 oz (82.3 kg)   BMI 28.41 kg/m  Wt Readings from Last 3 Encounters:  03/14/21 181 lb 6.4 oz (82.3 kg)  11/01/20 184 lb 8 oz (83.7 kg)  10/22/20 183 lb 8 oz (83.2 kg)     Health Maintenance Due  Topic Date Due   FOOT EXAM  Never done   OPHTHALMOLOGY EXAM  Never done   Hepatitis C Screening  Never done   Zoster Vaccines- Shingrix (1 of 2) Never done   PNA vac Low Risk Adult (1 of 2 - PCV13) Never done   COVID-19 Vaccine (3 - Booster for Moderna series) 05/05/2020   HEMOGLOBIN A1C  01/13/2021    There are no preventive care reminders to display for this patient.  Lab Results  Component Value Date   TSH 0.853 10/22/2020   Lab Results  Component Value Date   WBC 7.5 07/15/2020   HGB 13.0 (L) 07/15/2020   HCT 39.2 07/15/2020   MCV 95.6 07/15/2020   PLT 262 07/15/2020   Lab Results  Component Value Date   NA 140 07/15/2020   K 4.2  07/15/2020   CO2 22 07/15/2020  GLUCOSE 155 (H) 07/15/2020   BUN 23 07/15/2020   CREATININE 1.58 (H) 07/15/2020   BILITOT 0.5 07/15/2020   ALKPHOS 61 12/29/2017   AST 16 07/15/2020   ALT 12 07/15/2020   PROT 6.7 07/15/2020   ALBUMIN 3.8 12/29/2017   CALCIUM 9.2 07/15/2020   ANIONGAP 7 12/29/2017   No results found for: CHOL No results found for: HDL No results found for: LDLCALC No results found for: TRIG No results found for: CHOLHDL Lab Results  Component Value Date   HGBA1C 8.0 (H) 07/15/2020      Assessment & Plan:  Report of the electrocardiogram. Electrocardiogram reveals atrial fibrillation #2 nonspecific ST-T abnormalities    Problem List Items Addressed This Visit       Cardiovascular and Mediastinum   Longstanding persistent atrial fibrillation ([place]) - Primary    Under control .  Will start on Eliquis He was given a prescription for 2.5 mg by mouth twice a day       Relevant Medications   spironolactone (ALDACTONE) 25 MG tablet   apixaban (ELIQUIS) 2.5 MG TABS tablet   Essential hypertension     Patient denies any chest pain or shortness of breath there is no history of palpitation or paroxysmal nocturnal dyspnea  blood pressure  Is elevated   patient was advised to follow low-salt low-cholesterol diet    ideally I want to keep systolic blood pressure below 130 mmHg, patient was asked to check blood pressure one times a week and give me a report on that.  Patient will be follow-up in 3 months  or earlier as needed, patient will call me back for any change in the cardiovascular symptoms Patient was advised to buy a book from local bookstore concerning blood pressure and read several chapters  every day.  This will be supplemented by some of the material we will give him from the office.  Patient should also utilize other resources like YouTube and Internet to learn more about the blood pressure and the diet.       Relevant Medications   spironolactone  (ALDACTONE) 25 MG tablet   apixaban (ELIQUIS) 2.5 MG TABS tablet     Endocrine   Diabetes mellitus ([place])    - The patient's blood sugar is labile on med. - The patient will continue the current treatment regimen.  - I encouraged the patient to regularly check blood sugar.  - I encouraged the patient to monitor diet. I encouraged the patient to eat low-carb and low-sugar to help prevent blood sugar spikes.  - I encouraged the patient to continue following their prescribed treatment plan for diabetes - I informed the patient to get help if blood sugar drops below 54mg /dL, or if suddenly have trouble thinking clearly or breathing.  Patient was advised to buy a book on diabetes from a local bookstore or from [place]  Patient should read 2 chapters every day to keep the motivation going, this is in addition to some of the materials we provided them from the office.  There are other resources on the Internet like YouTube and [employer]kip[employer] to get an education on the diabetes       Relevant Orders   POCT glucose (manual entry) (Completed)     Musculoskeletal and Integument   Cervical spine arthritis    Stable at the present time         Other   Memory loss    Continue with Aricept.       Relevant  Medications   donepezil (ARICEPT) 10 MG tablet    Meds ordered this encounter  Medications   spironolactone (ALDACTONE) 25 MG tablet    Sig: Take 1 tablet (25 mg total) by mouth daily.    Dispense:  90 tablet    Refill:  3   donepezil (ARICEPT) 10 MG tablet    Sig: Take 1 tablet (10 mg total) by mouth daily.    Dispense:  90 tablet    Refill:  3   apixaban (ELIQUIS) 2.5 MG TABS tablet    Sig: Take 1 tablet (2.5 mg total) by mouth 2 (two) times daily.    Dispense:  60 tablet    Refill:  4    Follow-up: No follow-ups on file.    Cletis Athens, MD

## 2021-03-14 NOTE — Assessment & Plan Note (Signed)
Continue with Aricept 

## 2021-03-14 NOTE — Assessment & Plan Note (Addendum)
Under control .  Will start on Eliquis He was given a prescription for 2.5 mg by mouth twice a day

## 2021-03-14 NOTE — Assessment & Plan Note (Signed)
Patient denies any chest pain or shortness of breath there is no history of palpitation or paroxysmal nocturnal dyspnea  blood pressure  Is elevated   patient was advised to follow low-salt low-cholesterol diet    ideally I want to keep systolic blood pressure below 130 mmHg, patient was asked to check blood pressure one times a week and give me a report on that.  Patient will be follow-up in 3 months  or earlier as needed, patient will call me back for any change in the cardiovascular symptoms Patient was advised to buy a book from local bookstore concerning blood pressure and read several chapters  every day.  This will be supplemented by some of the material we will give him from the office.  Patient should also utilize other resources like YouTube and Internet to learn more about the blood pressure and the diet.

## 2021-03-14 NOTE — Assessment & Plan Note (Signed)

## 2021-03-14 NOTE — Assessment & Plan Note (Signed)
Stable at the present time. 

## 2021-03-16 ENCOUNTER — Other Ambulatory Visit: Payer: Self-pay | Admitting: *Deleted

## 2021-03-16 DIAGNOSIS — I4811 Longstanding persistent atrial fibrillation: Secondary | ICD-10-CM

## 2021-03-16 DIAGNOSIS — I10 Essential (primary) hypertension: Secondary | ICD-10-CM

## 2021-03-16 DIAGNOSIS — R413 Other amnesia: Secondary | ICD-10-CM

## 2021-03-16 MED ORDER — SPIRONOLACTONE 25 MG PO TABS: 25.0000 mg | ORAL_TABLET | Freq: Every day | ORAL | 3 refills | Status: DC

## 2021-03-16 MED ORDER — DONEPEZIL HCL 10 MG PO TABS: 10.0000 mg | ORAL_TABLET | Freq: Every day | ORAL | 3 refills | Status: DC

## 2021-03-16 MED ORDER — PRAVASTATIN SODIUM 40 MG PO TABS: 40.0000 mg | ORAL_TABLET | Freq: Every day | ORAL | 3 refills | Status: DC

## 2021-03-16 MED ORDER — TAMSULOSIN HCL 0.4 MG PO CAPS: 0.4000 mg | ORAL_CAPSULE | Freq: Every day | ORAL | 3 refills | Status: DC

## 2021-03-16 MED ORDER — MELOXICAM 15 MG PO TABS: 15.0000 mg | ORAL_TABLET | Freq: Every day | ORAL | 3 refills | Status: DC

## 2021-03-16 MED ORDER — LISINOPRIL 40 MG PO TABS: 40.0000 mg | ORAL_TABLET | Freq: Every day | ORAL | 3 refills | Status: DC

## 2021-03-16 MED ORDER — APIXABAN 2.5 MG PO TABS: 2.5000 mg | ORAL_TABLET | Freq: Two times a day (BID) | ORAL | 3 refills | Status: DC

## 2021-03-16 MED ORDER — CYANOCOBALAMIN 1000 MCG/ML IJ SOLN: INTRAMUSCULAR | 3 refills | Status: DC

## 2021-03-16 NOTE — Addendum Note (Signed)
Addended by: Alois Cliche on: 03/16/2021 09:32 AM   Modules accepted: Orders

## 2021-03-25 ENCOUNTER — Other Ambulatory Visit: Payer: Self-pay | Admitting: Internal Medicine

## 2021-03-25 DIAGNOSIS — M47812 Spondylosis without myelopathy or radiculopathy, cervical region: Secondary | ICD-10-CM

## 2021-04-01 ENCOUNTER — Other Ambulatory Visit: Payer: Self-pay

## 2021-04-01 MED ORDER — OMEPRAZOLE 20 MG PO CPDR: 20.0000 mg | DELAYED_RELEASE_CAPSULE | Freq: Every day | ORAL | 2 refills | Status: DC

## 2021-04-01 MED ORDER — FINASTERIDE 5 MG PO TABS: 5.0000 mg | ORAL_TABLET | Freq: Every day | ORAL | 3 refills | Status: DC

## 2021-04-01 MED ORDER — GLIPIZIDE 10 MG PO TABS: 10.0000 mg | ORAL_TABLET | Freq: Every day | ORAL | 2 refills | Status: DC

## 2021-04-01 MED ORDER — METFORMIN HCL 500 MG PO TABS: 500.0000 mg | ORAL_TABLET | Freq: Every day | ORAL | 2 refills | Status: DC

## 2021-04-13 ENCOUNTER — Ambulatory Visit: Payer: Medicare Other | Admitting: Internal Medicine

## 2021-04-13 ENCOUNTER — Other Ambulatory Visit: Payer: Self-pay | Admitting: Internal Medicine

## 2021-04-20 ENCOUNTER — Encounter: Payer: Self-pay | Admitting: Internal Medicine

## 2021-04-20 ENCOUNTER — Ambulatory Visit (INDEPENDENT_AMBULATORY_CARE_PROVIDER_SITE_OTHER): Payer: Medicare Other | Admitting: Internal Medicine

## 2021-04-20 ENCOUNTER — Other Ambulatory Visit: Payer: Self-pay

## 2021-04-20 VITALS — BP 134/94 | HR 88 | Ht 67.0 in | Wt 185.0 lb

## 2021-04-20 DIAGNOSIS — I4891 Unspecified atrial fibrillation: Secondary | ICD-10-CM

## 2021-04-20 DIAGNOSIS — M47812 Spondylosis without myelopathy or radiculopathy, cervical region: Secondary | ICD-10-CM | POA: Diagnosis not present

## 2021-04-20 DIAGNOSIS — E1169 Type 2 diabetes mellitus with other specified complication: Secondary | ICD-10-CM

## 2021-04-20 DIAGNOSIS — R413 Other amnesia: Secondary | ICD-10-CM

## 2021-04-20 DIAGNOSIS — I10 Essential (primary) hypertension: Secondary | ICD-10-CM | POA: Diagnosis not present

## 2021-04-20 DIAGNOSIS — I4811 Longstanding persistent atrial fibrillation: Secondary | ICD-10-CM | POA: Diagnosis not present

## 2021-04-20 LAB — POCT INR: INR: 1.7 — AB (ref 2.0–3.0)

## 2021-04-20 MED ORDER — WARFARIN SODIUM 5 MG PO TABS: 5.0000 mg | ORAL_TABLET | Freq: Every day | ORAL | 3 refills | Status: DC

## 2021-04-20 NOTE — Assessment & Plan Note (Signed)
Patient has a significant memory loss

## 2021-04-20 NOTE — Assessment & Plan Note (Signed)
We will start patient on Coumadin he was advised not to take Advil Aleve or aspirin.  We will recheck his pro time in 2 weeks.  Patient has stopped Eliquis.  Because of the cost

## 2021-04-20 NOTE — Assessment & Plan Note (Signed)
stable at the present time

## 2021-04-20 NOTE — Progress Notes (Addendum)
Chief Complaint  Patient presents with   Follow-up    Room 13, routine follow up No changes, wife Hoyle Sauer in room     HISTORY OF PRESENT ILLNESS: 04/21/21 ALL:  Bob Brewer is a 80 y.o. male here today for follow up for AD. Labs showed B12 and vitamin D deficiency. He continues donepezil and memantine. He started B12 injections and vitamin D 2000iu daily. He feels he is doing really well. His wife is a bit tearful as she feels he continues to have difficulty with short term memory loss and trouble recalling information. He is tolerating medications well. He is able to complete ADLs independently. He is driving daytime, short local distances with no difficulty. He is eating well. He does wake at night to use restroom but usually able to get back to sleep. Goes to bed around 7-8 and wakes around 6.    HISTORY (copied from Dr Garth Bigness previous note)  I had the pleasure seeing your patient, Bob Brewer, at [facility] for neurologic consultation regarding his memory loss and other cognitive issues.   He is a 80 year old man who first noted memory problems 6 months ago.  He does feel he is having more trouble with memory and also some tasks around the home.  His wife first noted that he was forgetful about 2 years ago.  He would not remember things he recently did.  As an example the day after they went out to eat at a restaurant he did not recall that he had been there.  Additionally, she needed to take over more responsibility for complex tasks - like tasks with the computer or fixing the phone.   More recently she notes he has more trouble coming up with the right names or words.     He started donepezil but it has not improved any of his cognitive symptoms.          He sleeps poorly many nights due to nocturia.   He does seem to quickly fall back asleep most times after he wakes up, however.  He will sometimes takes naps.    He snores but no OSA signs.      He has  Type 2 NIDDM (30 years).  He has elevated cholesterol.  No known thyroid dysfunction.  No known deficiencies.   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   [place] and he did a lot of outdoor activities after retirement and then returned around 2001.  He did some fishing but mostly spent time surfing [employer], build computers to resell.   He stopped about 10 years ago and mostly surfs the web or watches TV.      He had an MRI of the brain 02/17/2020 that I personally reviewed.  It shows moderate generalized cortical atrophy that is more severe in the medial temporal lobes.  There is only a minimal amount of age-appropriate chronic microvascular ischemic change.  A pattern like this is consistent with Alzheimer's disease.   REVIEW OF SYSTEMS: Out of a complete 14 system review of symptoms, the patient complains only of the following symptoms, memory loss and all other reviewed systems are negative.   ALLERGIES: No Known Allergies   HOME MEDICATIONS: Outpatient Medications Prior to Visit  Medication Sig Dispense Refill   Alcohol Swabs (ALCOHOL PREP) PADS 1 each by Does not apply route daily. 300 each 3   Blood Glucose Monitoring  Suppl (TRUE METRIX AIR GLUCOSE METER) DEVI Use to check blood sugar daily 1 each 6   cyanocobalamin (,VITAMIN B-12,) 1000 MCG/ML injection Inject 1 ml subcutaneously once a month 3 mL 3   donepezil (ARICEPT) 10 MG tablet Take 1 tablet (10 mg total) by mouth daily. 90 tablet 3   finasteride (PROSCAR) 5 MG tablet Take 1 tablet (5 mg total) by mouth daily. 90 tablet 3   glipiZIDE (GLUCOTROL) 10 MG tablet Take 1 tablet (10 mg total) by mouth daily before breakfast. Patient is unsure of strength, asked pt to call back with this information 90 tablet 2   glucose blood (RELION TRUE METRIX TEST STRIPS) test strip Use as instructed 300 each 3   Lancets 33G MISC Check blood sugar daily 300 each 3   lisinopril (ZESTRIL) 40 MG tablet Take 1 tablet (40  mg total) by mouth daily. 90 tablet 3   memantine (NAMENDA) 10 MG tablet TAKE 1/2 PILL A DAY FOR 1 WEEK, THEN 1/2 PILL TWICE A DAY FOR 1 WEEK THEN 1 PILL BY MOUTH TWICE A DAY 180 tablet 3   metFORMIN (GLUCOPHAGE) 500 MG tablet Take 1 tablet (500 mg total) by mouth daily. 90 tablet 2   omeprazole (PRILOSEC) 20 MG capsule Take 1 capsule (20 mg total) by mouth daily. 90 capsule 2   pravastatin (PRAVACHOL) 40 MG tablet Take 1 tablet (40 mg total) by mouth daily. 90 tablet 3   spironolactone (ALDACTONE) 25 MG tablet Take 1 tablet (25 mg total) by mouth daily. 90 tablet 3   tamsulosin (FLOMAX) 0.4 MG CAPS capsule Take 1 capsule (0.4 mg total) by mouth daily. 90 capsule 3   VITAMIN D PO Take 2,000 Units by mouth daily.     warfarin (COUMADIN) 5 MG tablet Take 1 tablet (5 mg total) by mouth daily. 30 tablet 3   No facility-administered medications prior to visit.     PAST MEDICAL HISTORY: Past Medical History:  Diagnosis Date   Diabetes mellitus without complication ([place])    Hypertension    Memory loss      PAST SURGICAL HISTORY: History reviewed. No pertinent surgical history.   FAMILY HISTORY: History reviewed. No pertinent family history.   SOCIAL HISTORY: Social History   Socioeconomic History   Marital status: Married    Spouse name: Hoyle Sauer   Number of children: Not on file   Years of education: Not on file   Highest education level: Not on file  Occupational History   Not on file  Tobacco Use   Smoking status: Former   Smokeless tobacco: Never  Substance and Sexual Activity   Alcohol use: Not Currently   Drug use: Never   Sexual activity: Not on file  Other Topics Concern   Not on file  Social History Narrative   Life with wife   Right handed   Drinks 5-6 cups caffeine daily   Social Determinants of Health   Financial Resource Strain: Not on file  Food Insecurity: Not on file  Transportation Needs: Not on file  Physical Activity: Not on file  Stress: Not  on file  Social Connections: Not on file  Intimate Partner Violence: Not on file     PHYSICAL EXAM  Vitals:   04/21/21 0836  BP: (!) 180/98  Pulse: (!) 104  Weight: 183 lb 8 oz (83.2 kg)  Height: '5\' 4"'$  (1.626 m)   Body mass index is 31.5 kg/m.   Generalized: Well developed, in no acute distress  Cardiology: normal rate and rhythm, no murmur auscultated  Respiratory: clear to auscultation bilaterally    Neurological examination  Mentation: Alert oriented to time, place, history taking. Follows all commands speech and language fluent Cranial nerve II-XII: Pupils were equal round reactive to light. Extraocular movements were full, visual field were full on confrontational test. Facial sensation and strength were normal. Head turning and shoulder shrug  were normal and symmetric. Motor: The motor testing reveals 5 over 5 strength of all 4 extremities. Good symmetric motor tone is noted throughout.  Sensory: Sensory testing is intact to soft touch on all 4 extremities. No evidence of extinction is noted.  Coordination: Cerebellar testing reveals good finger-nose-finger and heel-to-shin bilaterally.  Gait and station: Gait is normal.    DIAGNOSTIC DATA (LABS, IMAGING, TESTING) - I reviewed patient records, labs, notes, testing and imaging myself where available.  Lab Results  Component Value Date   WBC 7.5 07/15/2020   HGB 13.0 (L) 07/15/2020   HCT 39.2 07/15/2020   MCV 95.6 07/15/2020   PLT 262 07/15/2020      Component Value Date/Time   NA 140 07/15/2020 1047   NA 142 06/15/2012 0504   K 4.2 07/15/2020 1047   K 3.4 (L) 06/15/2012 0504   CL 106 07/15/2020 1047   CL 104 06/15/2012 0504   CO2 22 07/15/2020 1047   CO2 26 06/15/2012 0504   GLUCOSE 155 (H) 07/15/2020 1047   GLUCOSE 153 (H) 06/15/2012 0504   BUN 23 07/15/2020 1047   BUN 15 06/15/2012 0504   CREATININE 1.58 (H) 07/15/2020 1047   CALCIUM 9.2 07/15/2020 1047   CALCIUM 8.9 06/15/2012 0504   PROT 6.7  07/15/2020 1047   PROT 7.7 06/15/2012 0504   ALBUMIN 3.8 12/29/2017 1737   ALBUMIN 3.7 06/15/2012 0504   AST 16 07/15/2020 1047   AST 20 06/15/2012 0504   ALT 12 07/15/2020 1047   ALT 33 06/15/2012 0504   ALKPHOS 61 12/29/2017 1737   ALKPHOS 85 06/15/2012 0504   BILITOT 0.5 07/15/2020 1047   BILITOT 0.3 06/15/2012 0504   GFRNONAA 41 (L) 07/15/2020 1047   GFRAA 48 (L) 07/15/2020 1047   No results found for: CHOL, HDL, LDLCALC, LDLDIRECT, TRIG, CHOLHDL Lab Results  Component Value Date   HGBA1C 8.0 (H) 07/15/2020   Lab Results  Component Value Date   VITAMINB12 155 (L) 10/22/2020   Lab Results  Component Value Date   TSH 0.853 10/22/2020    MMSE - Mini Mental State Exam 04/21/2021 10/22/2020  Orientation to time 4 3  Orientation to Place 3 4  Registration 3 3  Attention/ Calculation 3 1  Recall 0 0  Language- name 2 objects 2 2  Language- repeat 1 1  Language- follow 3 step command 3 3  Language- read & follow direction 1 1  Write a sentence 1 1  Copy design 1 1  Total score 22 20     No flowsheet data found.   ASSESSMENT AND PLAN  80 y.o. year old male  has a past medical history of Diabetes mellitus without complication ([place]), Hypertension, and Memory loss. here with    Alzheimer disease ([place])  Vitamin D deficiency - Plan: Vitamin D, 25-hydroxy  Vitamin B 12 deficiency - Plan: Vitamin B12  Jonathin feels he is doing well. His wife reports that he continues to have difficulty with short term memory and recall. MMSE 22/30, today. Previously 20/30. We will continue memantine '10mg'$  BID and donepezil '10mg'$  daily.  I will update labs, today. He will continue vitamin B12 and D supplements pending blood work results. Healthy lifestyle habits advised. He will follow up with me in 6 months.    Orders Placed This Encounter  Procedures   Vitamin D, 25-hydroxy   Vitamin B12      No orders of the defined types were placed in this encounter.     Debbora Presto, MSN, FNP-C  04/21/2021, 10:49 AM  [facility] [address] [place], [place] [address] [phone]    I have read the note, and I agree with the clinical assessment and plan.  Richard A. Felecia Shelling, MD, PhD, [facility] Certified in Neurology, Clinical Neurophysiology, Sleep Medicine, Pain Medicine and Neuroimaging  [facility] [address] [place],  [address] [phone]

## 2021-04-20 NOTE — Assessment & Plan Note (Signed)

## 2021-04-20 NOTE — Progress Notes (Signed)
Established Patient Office Visit  Subjective:  Patient ID: Bob Brewer, male    DOB: November 28, 1940  Age: 80 y.o. MRN: [id number]  CC:  Chief Complaint  Patient presents with   Medication Problem    Patient is unable to afford eliquis and is wanting to switch to something more affordable.     HPI  Bob Brewer presents forbp check, patient does not want to take Eliquis  discussed with him the switch to Coumadin.  He denies any chest pain shortness of breath dizziness passing out spell.  Past Medical History:  Diagnosis Date   Diabetes mellitus without complication ([employer] [place])    Hypertension     History reviewed. No pertinent surgical history.  History reviewed. No pertinent family history.  Social History   Socioeconomic History   Marital status: Married    Spouse name: Not on file   Number of children: Not on file   Years of education: Not on file   Highest education level: Not on file  Occupational History   Not on file  Tobacco Use   Smoking status: Former   Smokeless tobacco: Never  Substance and Sexual Activity   Alcohol use: Not Currently   Drug use: Never   Sexual activity: Not on file  Other Topics Concern   Not on file  Social History Narrative   Life with wife   Right handed   Drinks 5-6 cups caffeine daily   Social Determinants of Health   Financial Resource Strain: Not on file  Food Insecurity: Not on file  Transportation Needs: Not on file  Physical Activity: Not on file  Stress: Not on file  Social Connections: Not on file  Intimate Partner Violence: Not on file     Current Outpatient Medications:    Alcohol Swabs (ALCOHOL PREP) PADS, 1 each by Does not apply route daily., Disp: 300 each, Rfl: 3   Blood Glucose Monitoring Suppl (TRUE METRIX AIR GLUCOSE METER) DEVI, Use to check blood sugar daily, Disp: 1 each, Rfl: 6   cyanocobalamin (,VITAMIN B-12,) 1000 MCG/ML injection, Inject 1 ml subcutaneously once a month, Disp: 3 mL, Rfl: 3    donepezil (ARICEPT) 10 MG tablet, Take 1 tablet (10 mg total) by mouth daily., Disp: 90 tablet, Rfl: 3   finasteride (PROSCAR) 5 MG tablet, Take 1 tablet (5 mg total) by mouth daily., Disp: 90 tablet, Rfl: 3   glipiZIDE (GLUCOTROL) 10 MG tablet, Take 1 tablet (10 mg total) by mouth daily before breakfast. Patient is unsure of strength, asked pt to call back with this information, Disp: 90 tablet, Rfl: 2   glucose blood (RELION TRUE METRIX TEST STRIPS) test strip, Use as instructed, Disp: 300 each, Rfl: 3   Lancets 33G MISC, Check blood sugar daily, Disp: 300 each, Rfl: 3   lisinopril (ZESTRIL) 40 MG tablet, Take 1 tablet (40 mg total) by mouth daily., Disp: 90 tablet, Rfl: 3   memantine (NAMENDA) 10 MG tablet, TAKE 1/2 PILL A DAY FOR 1 WEEK, THEN 1/2 PILL TWICE A DAY FOR 1 WEEK THEN 1 PILL BY MOUTH TWICE A DAY, Disp: 180 tablet, Rfl: 3   metFORMIN (GLUCOPHAGE) 500 MG tablet, Take 1 tablet (500 mg total) by mouth daily., Disp: 90 tablet, Rfl: 2   omeprazole (PRILOSEC) 20 MG capsule, Take 1 capsule (20 mg total) by mouth daily., Disp: 90 capsule, Rfl: 2   pravastatin (PRAVACHOL) 40 MG tablet, Take 1 tablet (40 mg total) by mouth daily., Disp: 90 tablet,  Rfl: 3   spironolactone (ALDACTONE) 25 MG tablet, Take 1 tablet (25 mg total) by mouth daily., Disp: 90 tablet, Rfl: 3   tamsulosin (FLOMAX) 0.4 MG CAPS capsule, Take 1 capsule (0.4 mg total) by mouth daily., Disp: 90 capsule, Rfl: 3   VITAMIN D PO, Take 2,000 Units by mouth daily., Disp: , Rfl:    warfarin (COUMADIN) 5 MG tablet, Take 1 tablet (5 mg total) by mouth daily., Disp: 30 tablet, Rfl: 3   No Known Allergies  ROS Review of Systems  Constitutional: Negative.   HENT: Negative.    Eyes: Negative.   Respiratory: Negative.    Cardiovascular: Negative.   Gastrointestinal: Negative.   Endocrine: Negative.   Genitourinary: Negative.   Musculoskeletal: Negative.   Skin: Negative.   Allergic/Immunologic: Negative.   Neurological: Negative.    Hematological: Negative.   Psychiatric/Behavioral: Negative.    All other systems reviewed and are negative.    Objective:    Physical Exam Vitals reviewed.  Constitutional:      Appearance: Normal appearance. He is normal weight.  HENT:     Mouth/Throat:     Mouth: Mucous membranes are moist.  Eyes:     Pupils: Pupils are equal, round, and reactive to light.  Neck:     Vascular: No carotid bruit.  Cardiovascular:     Rate and Rhythm: Normal rate. Rhythm irregular.     Pulses: Normal pulses.     Heart sounds: Normal heart sounds.  Pulmonary:     Effort: Pulmonary effort is normal.     Breath sounds: Normal breath sounds.  Abdominal:     General: Bowel sounds are normal.     Palpations: Abdomen is soft. There is no hepatomegaly, splenomegaly or mass.     Tenderness: There is no abdominal tenderness.     Hernia: No hernia is present.  Musculoskeletal:     Cervical back: Neck supple.     Right lower leg: No edema.     Left lower leg: No edema.  Skin:    Findings: No rash.  Neurological:     Mental Status: He is alert and oriented to person, place, and time.     Motor: No weakness.  Psychiatric:        Mood and Affect: Mood normal.        Behavior: Behavior normal.    BP (!) 134/94   Pulse 88   Ht '5\' 7"'$  (1.702 m)   Wt 185 lb (83.9 kg)   BMI 28.98 kg/m  Wt Readings from Last 3 Encounters:  04/20/21 185 lb (83.9 kg)  03/14/21 181 lb 6.4 oz (82.3 kg)  11/01/20 184 lb 8 oz (83.7 kg)     Health Maintenance Due  Topic Date Due   FOOT EXAM  Never done   OPHTHALMOLOGY EXAM  Never done   Hepatitis C Screening  Never done   Zoster Vaccines- Shingrix (1 of 2) Never done   PNA vac Low Risk Adult (1 of 2 - PCV13) Never done   HEMOGLOBIN A1C  01/13/2021   INFLUENZA VACCINE  04/18/2021    There are no preventive care reminders to display for this patient.  Lab Results  Component Value Date   TSH 0.853 10/22/2020   Lab Results  Component Value Date   WBC 7.5  07/15/2020   HGB 13.0 (L) 07/15/2020   HCT 39.2 07/15/2020   MCV 95.6 07/15/2020   PLT 262 07/15/2020   Lab Results  Component Value Date  NA 140 07/15/2020   K 4.2 07/15/2020   CO2 22 07/15/2020   GLUCOSE 155 (H) 07/15/2020   BUN 23 07/15/2020   CREATININE 1.58 (H) 07/15/2020   BILITOT 0.5 07/15/2020   ALKPHOS 61 12/29/2017   AST 16 07/15/2020   ALT 12 07/15/2020   PROT 6.7 07/15/2020   ALBUMIN 3.8 12/29/2017   CALCIUM 9.2 07/15/2020   ANIONGAP 7 12/29/2017   No results found for: CHOL No results found for: HDL No results found for: LDLCALC No results found for: TRIG No results found for: CHOLHDL Lab Results  Component Value Date   HGBA1C 8.0 (H) 07/15/2020      Assessment & Plan:   Problem List Items Addressed This Visit       Cardiovascular and Mediastinum   Longstanding persistent atrial fibrillation ([place])    We will start patient on Coumadin he was advised not to take Advil Aleve or aspirin.  We will recheck his pro time in 2 weeks.  Patient has stopped Eliquis.  Because of the cost       Relevant Medications   warfarin (COUMADIN) 5 MG tablet   Essential hypertension - Primary     Patient denies any chest pain or shortness of breath there is no history of palpitation or paroxysmal nocturnal dyspnea   patient was advised to follow low-salt low-cholesterol diet    ideally I want to keep systolic blood pressure below 130 mmHg, patient was asked to check blood pressure one times a week and give me a report on that.  Patient will be follow-up in 3 months  or earlier as needed, patient will call me back for any change in the cardiovascular symptoms Patient was advised to buy a book from local bookstore concerning blood pressure and read several chapters  every day.  This will be supplemented by some of the material we will give him from the office.  Patient should also utilize other resources like YouTube and Internet to learn more about the blood pressure and the  diet.       Relevant Medications   warfarin (COUMADIN) 5 MG tablet     Endocrine   Diabetes mellitus ([place])    - The patient's blood sugar is labile on med. - The patient will continue the current treatment regimen.  - I encouraged the patient to regularly check blood sugar.  - I encouraged the patient to monitor diet. I encouraged the patient to eat low-carb and low-sugar to help prevent blood sugar spikes.  - I encouraged the patient to continue following their prescribed treatment plan for diabetes - I informed the patient to get help if blood sugar drops below '54mg'$ /dL, or if suddenly have trouble thinking clearly or breathing.  Patient was advised to buy a book on diabetes from a local bookstore or from [place]  Patient should read 2 chapters every day to keep the motivation going, this is in addition to some of the materials we provided them from the office.  There are other resources on the Internet like YouTube and wilkipedia to get an education on the diabetes         Musculoskeletal and Integument   Cervical spine arthritis     stable at the present time         Other   Memory loss    Patient has a significant memory loss       Other Visit Diagnoses     Atrial fibrillation, unspecified type ([place])  Relevant Medications   warfarin (COUMADIN) 5 MG tablet   Other Relevant Orders   POCT INR (Completed)       Meds ordered this encounter  Medications   warfarin (COUMADIN) 5 MG tablet    Sig: Take 1 tablet (5 mg total) by mouth daily.    Dispense:  30 tablet    Refill:  3    Patient has to have INR checked MONTHLY before refill    Follow-up: No follow-ups on file.    Cletis Athens, MD

## 2021-04-20 NOTE — Assessment & Plan Note (Signed)

## 2021-04-20 NOTE — Patient Instructions (Addendum)
Below is our plan:  We will continue donepezil '10mg'$  daily and memantine '10mg'$  twice daily. Try valerian root for help with staying asleep. Please check your blood pressure when you get home. If it is still greater than 150/90, please call PCP to let them know.   Please make sure you are staying well hydrated. I recommend 50-60 ounces daily. Well balanced diet and regular exercise encouraged. Consistent sleep schedule with 6-8 hours recommended.   Please continue follow up with care team as directed.   Follow up with me in 6 months   You may receive a survey regarding today's visit. I encourage you to leave honest feed back as I do use this information to improve patient care. Thank you for seeing me today!   Management of Memory Problems   There are some general things you can do to help manage your memory problems.  Your memory may not in fact recover, but by using techniques and strategies you will be able to manage your memory difficulties better.   1)  Establish a routine. Try to establish and then stick to a regular routine.  By doing this, you will get used to what to expect and you will reduce the need to rely on your memory.  Also, try to do things at the same time of day, such as taking your medication or checking your calendar first thing in the morning. Think about think that you can do as a part of a regular routine and make a list.  Then enter them into a daily planner to remind you.  This will help you establish a routine.   2)  Organize your environment. Organize your environment so that it is uncluttered.  Decrease visual stimulation.  Place everyday items such as keys or cell phone in the same place every day (ie.  Basket next to front door) Use post it notes with a brief message to yourself (ie. Turn off light, lock the door) Use labels to indicate where things go (ie. Which cupboards are for food, dishes, etc.) Keep a notepad and pen by the telephone to take messages   3)   Memory Aids A diary or journal/notebook/daily planner Making a list (shopping list, chore list, to do list that needs to be done) Using an alarm as a reminder (kitchen timer or cell phone alarm) Using cell phone to store information (Notes, Calendar, Reminders) Calendar/White board placed in a prominent position Post-it notes   In order for memory aids to be useful, you need to have good habits.  It's no good remembering to make a note in your journal if you don't remember to look in it.  Try setting aside a certain time of day to look in journal.   4)  Improving mood and managing fatigue. There may be other factors that contribute to memory difficulties.  Factors, such as anxiety, depression and tiredness can affect memory. Regular gentle exercise can help improve your mood and give you more energy. Simple relaxation techniques may help relieve symptoms of anxiety Try to get back to completing activities or hobbies you enjoyed doing in the past. Learn to pace yourself through activities to decrease fatigue. Find out about some local support groups where you can share experiences with others. Try and achieve 7-8 hours of sleep at night.

## 2021-04-21 ENCOUNTER — Encounter: Payer: Self-pay | Admitting: Family Medicine

## 2021-04-21 ENCOUNTER — Ambulatory Visit: Payer: Medicare Other | Admitting: Family Medicine

## 2021-04-21 VITALS — BP 180/98 | HR 104 | Ht 64.0 in | Wt 183.5 lb

## 2021-04-21 DIAGNOSIS — E559 Vitamin D deficiency, unspecified: Secondary | ICD-10-CM | POA: Diagnosis not present

## 2021-04-21 DIAGNOSIS — F0280 Dementia in other diseases classified elsewhere without behavioral disturbance: Secondary | ICD-10-CM

## 2021-04-21 DIAGNOSIS — G309 Alzheimer's disease, unspecified: Secondary | ICD-10-CM | POA: Diagnosis not present

## 2021-04-21 DIAGNOSIS — E538 Deficiency of other specified B group vitamins: Secondary | ICD-10-CM | POA: Diagnosis not present

## 2021-04-22 LAB — VITAMIN B12: Vitamin B-12: 365 pg/mL (ref 232–1245)

## 2021-04-22 LAB — VITAMIN D 25 HYDROXY (VIT D DEFICIENCY, FRACTURES): Vit D, 25-Hydroxy: 38.7 ng/mL (ref 30.0–100.0)

## 2021-04-26 ENCOUNTER — Ambulatory Visit: Payer: Medicare Other | Admitting: Internal Medicine

## 2021-05-03 ENCOUNTER — Ambulatory Visit: Payer: Medicare Other | Admitting: Internal Medicine

## 2021-05-04 ENCOUNTER — Ambulatory Visit: Payer: Medicare Other | Admitting: Internal Medicine

## 2021-07-04 ENCOUNTER — Other Ambulatory Visit: Payer: Self-pay | Admitting: *Deleted

## 2021-07-04 MED ORDER — WARFARIN SODIUM 5 MG PO TABS: 5.0000 mg | ORAL_TABLET | Freq: Every day | ORAL | 3 refills | Status: DC

## 2021-07-04 MED ORDER — MELOXICAM 15 MG PO TABS: 15.0000 mg | ORAL_TABLET | Freq: Every day | ORAL | 3 refills | Status: DC

## 2021-07-17 ENCOUNTER — Other Ambulatory Visit: Payer: Self-pay | Admitting: Internal Medicine

## 2021-08-26 ENCOUNTER — Ambulatory Visit: Payer: Medicare Other | Admitting: Internal Medicine

## 2021-08-29 ENCOUNTER — Ambulatory Visit: Payer: Medicare Other | Admitting: Internal Medicine

## 2021-08-29 ENCOUNTER — Ambulatory Visit (INDEPENDENT_AMBULATORY_CARE_PROVIDER_SITE_OTHER): Payer: Medicare Other

## 2021-08-29 ENCOUNTER — Other Ambulatory Visit: Payer: Self-pay

## 2021-08-29 DIAGNOSIS — Z23 Encounter for immunization: Secondary | ICD-10-CM

## 2021-08-29 DIAGNOSIS — I4891 Unspecified atrial fibrillation: Secondary | ICD-10-CM | POA: Diagnosis not present

## 2021-08-29 DIAGNOSIS — R609 Edema, unspecified: Secondary | ICD-10-CM

## 2021-08-29 DIAGNOSIS — E1169 Type 2 diabetes mellitus with other specified complication: Secondary | ICD-10-CM | POA: Diagnosis not present

## 2021-08-29 LAB — POCT GLYCOSYLATED HEMOGLOBIN (HGB A1C): Hemoglobin A1C: 7.4 % — AB (ref 4.0–5.6)

## 2021-08-29 LAB — GLUCOSE, POCT (MANUAL RESULT ENTRY): POC Glucose: 194 mg/dl — AB (ref 70–99)

## 2021-08-29 LAB — POCT INR: INR: 2.7 (ref 2.0–3.0)

## 2021-08-29 NOTE — Progress Notes (Signed)
Nurse Visit

## 2021-08-30 ENCOUNTER — Other Ambulatory Visit
Admission: RE | Admit: 2021-08-30 | Discharge: 2021-08-30 | Disposition: A | Discharge status: Home / Self Care | Payer: Medicare Other | Source: Ambulatory Visit | Attending: Cardiology | Admitting: Cardiology

## 2021-08-30 ENCOUNTER — Ambulatory Visit (INDEPENDENT_AMBULATORY_CARE_PROVIDER_SITE_OTHER): Payer: Medicare Other

## 2021-08-30 DIAGNOSIS — Z0100 Encounter for examination of eyes and vision without abnormal findings: Secondary | ICD-10-CM | POA: Diagnosis not present

## 2021-08-30 DIAGNOSIS — Z0000 Encounter for general adult medical examination without abnormal findings: Secondary | ICD-10-CM | POA: Diagnosis not present

## 2021-08-30 DIAGNOSIS — I509 Heart failure, unspecified: Secondary | ICD-10-CM | POA: Diagnosis not present

## 2021-08-30 DIAGNOSIS — I4891 Unspecified atrial fibrillation: Secondary | ICD-10-CM | POA: Diagnosis not present

## 2021-08-30 DIAGNOSIS — I10 Essential (primary) hypertension: Secondary | ICD-10-CM | POA: Diagnosis not present

## 2021-08-30 DIAGNOSIS — R0602 Shortness of breath: Secondary | ICD-10-CM | POA: Insufficient documentation

## 2021-08-30 LAB — BRAIN NATRIURETIC PEPTIDE: B Natriuretic Peptide: 340.2 pg/mL — ABNORMAL HIGH (ref 0.0–100.0)

## 2021-08-30 MED ORDER — LANCETS 33G MISC: 3 refills | Status: DC

## 2021-08-30 MED ORDER — TRUE METRIX AIR GLUCOSE METER DEVI: 6 refills | Status: DC

## 2021-08-30 MED ORDER — RELION TRUE METRIX TEST STRIPS VI STRP: ORAL_STRIP | 3 refills | Status: DC

## 2021-08-30 MED ORDER — ALCOHOL PREP PADS: 1.0000 | MEDICATED_PAD | Freq: Every day | 3 refills | Status: DC

## 2021-08-30 MED ORDER — METFORMIN HCL 500 MG PO TABS: 500.0000 mg | ORAL_TABLET | Freq: Two times a day (BID) | ORAL | 2 refills | Status: DC

## 2021-08-30 NOTE — Progress Notes (Signed)
Subjective:   Bob Brewer is a 80 y.o. male who presents for Medicare Annual/Subsequent preventive examination. I discussed the limitations of evaluation and management by telemedicine and the availability of in person appointments. The patient expressed understanding and agreed to proceed.   Visit performed by audio   Patient location: Home  Provider location: Home   Review of Systems    N/A       Objective:    There were no vitals filed for this visit. There is no height or weight on file to calculate BMI.  Advanced Directives 08/30/2021  Does Patient Have a Medical Advance Directive? Yes  Type of Paramedic of [place];Living will    Current Medications (verified) Outpatient Encounter Medications as of 08/30/2021  Medication Sig   Alcohol Swabs (ALCOHOL PREP) PADS 1 each by Does not apply route daily.   aspirin EC 81 MG tablet Take 81 mg by mouth daily. Swallow whole.   Blood Glucose Monitoring Suppl (TRUE METRIX AIR GLUCOSE METER) DEVI Use to check blood sugar 2 times daily   cyanocobalamin (,VITAMIN B-12,) 1000 MCG/ML injection Inject 1 ml subcutaneously once a month   donepezil (ARICEPT) 10 MG tablet Take 1 tablet (10 mg total) by mouth daily.   finasteride (PROSCAR) 5 MG tablet Take 1 tablet (5 mg total) by mouth daily.   glipiZIDE (GLUCOTROL) 10 MG tablet Take 1 tablet (10 mg total) by mouth daily before breakfast. Patient is unsure of strength, asked pt to call back with this information   glucose blood (RELION TRUE METRIX TEST STRIPS) test strip Check sugar 2 times daily   Lancets 33G MISC Check blood sugar 2 times daily   lisinopril (ZESTRIL) 40 MG tablet Take 1 tablet (40 mg total) by mouth daily.   meloxicam (MOBIC) 15 MG tablet Take 1 tablet (15 mg total) by mouth daily.   memantine (NAMENDA) 10 MG tablet TAKE 1/2 PILL A DAY FOR 1 WEEK, THEN 1/2 PILL TWICE A DAY FOR 1 WEEK THEN 1 PILL BY MOUTH TWICE A DAY   metFORMIN (GLUCOPHAGE)  500 MG tablet Take 1 tablet (500 mg total) by mouth 2 (two) times daily with a meal.   omeprazole (PRILOSEC) 20 MG capsule Take 1 capsule (20 mg total) by mouth daily.   pravastatin (PRAVACHOL) 40 MG tablet Take 1 tablet (40 mg total) by mouth daily.   spironolactone (ALDACTONE) 25 MG tablet Take 1 tablet (25 mg total) by mouth daily.   tamsulosin (FLOMAX) 0.4 MG CAPS capsule Take 1 capsule (0.4 mg total) by mouth daily.   VITAMIN D PO Take 2,000 Units by mouth daily.   [DISCONTINUED] Alcohol Swabs (ALCOHOL PREP) PADS 1 each by Does not apply route daily.   [DISCONTINUED] Blood Glucose Monitoring Suppl (TRUE METRIX AIR GLUCOSE METER) DEVI Use to check blood sugar daily   [DISCONTINUED] glucose blood (RELION TRUE METRIX TEST STRIPS) test strip Use as instructed   [DISCONTINUED] Lancets 33G MISC Check blood sugar daily   [DISCONTINUED] metFORMIN (GLUCOPHAGE) 500 MG tablet Take 500 mg by mouth 2 (two) times daily with a meal.   warfarin (COUMADIN) 5 MG tablet TAKE 1 TABLET (5 MG TOTAL) BY MOUTH DAILY. (Patient not taking: Reported on 08/30/2021)   No facility-administered encounter medications on file as of 08/30/2021.    Allergies (verified) Patient has no known allergies.   History: Past Medical History:  Diagnosis Date   Diabetes mellitus without complication ([place])    Hypertension    Memory  loss    Past Surgical History:  Procedure Laterality Date   BACK SURGERY  1992   also 1994   History reviewed. No pertinent family history. Social History   Socioeconomic History   Marital status: Married    Spouse name: Bob Brewer   Number of children: Not on file   Years of education: Not on file   Highest education level: 12th grade  Occupational History   Occupation: Retired  Tobacco Use   Smoking status: Former    Types: Cigarettes   Smokeless tobacco: Never  Substance and Sexual Activity   Alcohol use: Not Currently   Drug use: Never   Sexual activity: Not Currently  Other  Topics Concern   Not on file  Social History Narrative   Life with wife   Right handed   Drinks 5-6 cups caffeine daily   Social Determinants of Health   Financial Resource Strain: Low Risk    Difficulty of Paying Living Expenses: Not hard at all  Food Insecurity: No Food Insecurity   Worried About Charity fundraiser in the Last Year: Never true   Arboriculturist in the Last Year: Never true  Transportation Needs: No Transportation Needs   Lack of Transportation (Medical): No   Lack of Transportation (Non-Medical): No  Physical Activity: Inactive   Days of Exercise per Week: 0 days   Minutes of Exercise per Session: 0 min  Stress: No Stress Concern Present   Feeling of Stress : Not at all  Social Connections: Socially Integrated   Frequency of Communication with Friends and Family: More than three times a week   Frequency of Social Gatherings with Friends and Family: Twice a week   Attends Religious Services: More than 4 times per year   Active Member of [employer] or Organizations: Yes   Attends Music therapist: More than 4 times per year   Marital Status: Married    Tobacco Counseling Counseling given: Not Answered   Clinical Intake:  Pre-visit preparation completed: Yes  Pain : No/denies pain     Diabetes: Yes  How often do you need to have someone help you when you read instructions, pamphlets, or other written materials from your doctor or pharmacy?: 3 - Sometimes What is the last grade level you completed in school?: 12th grade  Diabetic?Yes  Interpreter Needed?: No  Information entered by :: [place] CMA   Activities of Daily Living In your present state of health, do you have any difficulty performing the following activities: 08/30/2021  Hearing? N  Vision? Y  Difficulty concentrating or making decisions? Y  Walking or climbing stairs? N  Dressing or bathing? N  Doing errands, shopping? N  Preparing Food and eating ? N  Using  the Toilet? N  In the past six months, have you accidently leaked urine? Y  Do you have problems with loss of bowel control? Y  Managing your Medications? N  Managing your Finances? N  Housekeeping or managing your Housekeeping? N  Some recent data might be hidden    Patient Care Team: Cletis Athens, MD as PCP - General (Internal Medicine)  Indicate any recent Medical Services you may have received from other than [facility] providers in the past year (date may be approximate).     Assessment:   This is a routine wellness examination for [place].  Hearing/Vision screen No results found.  Dietary issues and exercise activities discussed:     Goals Addressed   None  Depression Screen PHQ 2/9 Scores 08/30/2021 07/15/2020  PHQ - 2 Score 0 0    Fall Risk Fall Risk  08/30/2021 04/20/2021 07/15/2020  Falls in the past year? 0 0 -  Number falls in past yr: 0 0 0  Injury with Fall? 0 0 0  Risk for fall due to : - No Fall Risks -  Follow up - Falls evaluation completed -    FALL RISK PREVENTION PERTAINING TO THE HOME:  Any stairs in or around the home? No  If so, are there any without handrails? No  Home free of loose throw rugs in walkways, pet beds, electrical cords, etc? Yes  Adequate lighting in your home to reduce risk of falls? Yes   ASSISTIVE DEVICES UTILIZED TO PREVENT FALLS:  Life alert? No  Use of a cane, walker or w/c? No  Grab bars in the bathroom? Yes  Shower chair or bench in shower? Yes  Elevated toilet seat or a handicapped toilet? No   TIMED UP AND GO:  Was the test performed? No .  Length of time to ambulate 10 feet: 0 sec.     Cognitive Function: MMSE - Mini Mental State Exam 04/21/2021 10/22/2020  Orientation to time 4 3  Orientation to Place 3 4  Registration 3 3  Attention/ Calculation 3 1  Recall 0 0  Language- name 2 objects 2 2  Language- repeat 1 1  Language- follow 3 step command 3 3  Language- read & follow direction 1 1  Write a  sentence 1 1  Copy design 1 1  Total score 22 20     6CIT Screen 08/30/2021  What Year? 4 points  What month? 0 points  What time? 0 points  Count back from 20 0 points  Months in reverse 2 points  Repeat phrase 0 points  Total Score 6    Immunizations Immunization History  Administered Date(s) Administered   Influenza,inj,Quad PF,6+ Mos 06/29/2019   Influenza-Unspecified 07/14/2020   Moderna Sars-Covid-2 Vaccination 11/06/2019, 12/04/2019, 08/10/2020, 01/04/2021   PNEUMOCOCCAL CONJUGATE-20 08/29/2021   Tdap 07/15/2020    TDAP status: Up to date  Flu Vaccine status: Due, Education has been provided regarding the importance of this vaccine. Advised may receive this vaccine at local pharmacy or Health Dept. Aware to provide a copy of the vaccination record if obtained from local pharmacy or Health Dept. Verbalized acceptance and understanding.  Pneumococcal vaccine status: Up to date  Covid-19 vaccine status: Information provided on how to obtain vaccines.   Qualifies for Shingles Vaccine? Yes   Zostavax completed No   Shingrix Completed?: No.    Education has been provided regarding the importance of this vaccine. Patient has been advised to call insurance company to determine out of pocket expense if they have not yet received this vaccine. Advised may also receive vaccine at local pharmacy or Health Dept. Verbalized acceptance and understanding.  Screening Tests Health Maintenance  Topic Date Due   FOOT EXAM  Never done   OPHTHALMOLOGY EXAM  Never done   Hepatitis C Screening  Never done   Zoster Vaccines- Shingrix (1 of 2) Never done   COVID-19 Vaccine (5 - Booster for Moderna series) 03/01/2021   INFLUENZA VACCINE  04/18/2021   HEMOGLOBIN A1C  02/27/2022   TETANUS/TDAP  07/15/2030   Pneumonia Vaccine 72+ Years old  Completed   HPV VACCINES  Aged Out    Health Maintenance  Health Maintenance Due  Topic Date Due   FOOT  EXAM  Never done   OPHTHALMOLOGY EXAM   Never done   Hepatitis C Screening  Never done   Zoster Vaccines- Shingrix (1 of 2) Never done   COVID-19 Vaccine (5 - Booster for Moderna series) 03/01/2021   INFLUENZA VACCINE  04/18/2021    Colorectal cancer screening: No longer required.   Lung Cancer Screening: (Low Dose CT Chest recommended if Age 33-80 years, 30 pack-year currently smoking OR have quit w/in 15years.) does not qualify.   Lung Cancer Screening Referral: No  Additional Screening:  Hepatitis C Screening: does qualify; Completed No  Vision Screening: Recommended annual ophthalmology exams for early detection of glaucoma and other disorders of the eye. Is the patient up to date with their annual eye exam?  No  Who is the provider or what is the name of the office in which the patient attends annual eye exams? Referral will be placed today for [facility] If pt is not established with a provider, would they like to be referred to a provider to establish care? Yes .   Dental Screening: Recommended annual dental exams for proper oral hygiene  Community Resource Referral / Chronic Care Management: CRR required this visit?  No   CCM required this visit?  No      Plan:     I have personally reviewed and noted the following in the patients chart:   Medical and social history Use of alcohol, tobacco or illicit drugs  Current medications and supplements including opioid prescriptions. Patient is not currently taking opioid prescriptions. Functional ability and status Nutritional status Physical activity Advanced directives List of other physicians Hospitalizations, surgeries, and ER visits in previous 12 months Vitals Screenings to include cognitive, depression, and falls Referrals and appointments  In addition, I have reviewed and discussed with patient certain preventive protocols, quality metrics, and best practice recommendations. A written personalized care plan for preventive services as well  as general preventive health recommendations were provided to patient.    Mr. Mathes , Thank you for taking time to come for your Medicare Wellness Visit. I appreciate your ongoing commitment to your health goals. Please review the following plan we discussed and let me know if I can assist you in the future.   These are the goals we discussed:  Goals   None     This is a list of the screening recommended for you and due dates:  Health Maintenance  Topic Date Due   Complete foot exam   Never done   Eye exam for diabetics  Never done   Hepatitis C Screening: USPSTF Recommendation to screen - Ages 55-79 yo.  Never done   Zoster (Shingles) Vaccine (1 of 2) Never done   COVID-19 Vaccine (5 - Booster for Moderna series) 03/01/2021   Flu Shot  04/18/2021   Hemoglobin A1C  02/27/2022   Tetanus Vaccine  07/15/2030   Pneumonia Vaccine  Completed   HPV Vaccine  Aged [address] [place], [place]   08/30/2021   Nurse Notes: Patient was informed of needed vaccines and how to obtain them. Diabetic supplies was sent to mail order pharmacy today. A referral for annual eye exam will be ordered today. Patient's wife was informed about the Ophthalmology referral and that their office will call to schedule appointment for patient.

## 2021-08-31 NOTE — Progress Notes (Signed)
I have reviewed this visit and agree with the documentation.   

## 2021-09-01 ENCOUNTER — Other Ambulatory Visit: Payer: Self-pay | Admitting: Neurology

## 2021-09-21 ENCOUNTER — Ambulatory Visit (INDEPENDENT_AMBULATORY_CARE_PROVIDER_SITE_OTHER): Payer: Medicare Other | Admitting: Internal Medicine

## 2021-09-21 ENCOUNTER — Other Ambulatory Visit: Payer: Self-pay

## 2021-09-21 ENCOUNTER — Encounter: Payer: Self-pay | Admitting: Internal Medicine

## 2021-09-21 VITALS — BP 139/76 | HR 85 | Ht 64.0 in | Wt 178.5 lb

## 2021-09-21 DIAGNOSIS — E1169 Type 2 diabetes mellitus with other specified complication: Secondary | ICD-10-CM | POA: Diagnosis not present

## 2021-09-21 DIAGNOSIS — I10 Essential (primary) hypertension: Secondary | ICD-10-CM

## 2021-09-21 DIAGNOSIS — I4811 Longstanding persistent atrial fibrillation: Secondary | ICD-10-CM

## 2021-09-21 NOTE — Assessment & Plan Note (Addendum)
Patient has atrial fibrillation , controlled.  Chest is clear.  There is no swelling of the leg.  He is tolerating.  dieuretics well Echocardiogram has been scheduled.

## 2021-09-21 NOTE — Assessment & Plan Note (Signed)

## 2021-09-21 NOTE — Progress Notes (Signed)
Established Patient Office Visit  Subjective:  Patient ID: Bob Brewer, male    DOB: December 11, 1940  Age: 81 y.o. MRN: [id number]  CC:  Chief Complaint  Patient presents with   Follow-up    HPI  Bob Brewer presents for general check  Past Medical History:  Diagnosis Date   Diabetes mellitus without complication ([place])    Hypertension    Memory loss     Past Surgical History:  Procedure Laterality Date   [place]   also 1994    History reviewed. No pertinent family history.  Social History   Socioeconomic History   Marital status: Married    Spouse name: Hoyle Sauer   Number of children: Not on file   Years of education: Not on file   Highest education level: 12th grade  Occupational History   Occupation: Retired  Tobacco Use   Smoking status: Former    Types: Cigarettes   Smokeless tobacco: Never  Substance and Sexual Activity   Alcohol use: Not Currently   Drug use: Never   Sexual activity: Not Currently  Other Topics Concern   Not on file  Social History Narrative   Life with wife   Right handed   Drinks 5-6 cups caffeine daily   Social Determinants of Health   Financial Resource Strain: Low Risk    Difficulty of Paying Living Expenses: Not hard at all  Food Insecurity: No Food Insecurity   Worried About Charity fundraiser in the Last Year: Never true   Arboriculturist in the Last Year: Never true  Transportation Needs: No Transportation Needs   Lack of Transportation (Medical): No   Lack of Transportation (Non-Medical): No  Physical Activity: Inactive   Days of Exercise per Week: 0 days   Minutes of Exercise per Session: 0 min  Stress: No Stress Concern Present   Feeling of Stress : Not at all  Social Connections: Socially Integrated   Frequency of Communication with Friends and Family: More than three times a week   Frequency of Social Gatherings with Friends and Family: Twice a week   Attends Religious Services: More than 4  times per year   Active Member of [employer] or Organizations: Yes   Attends Music therapist: More than 4 times per year   Marital Status: Married  Human resources officer Violence: Not At Risk   Fear of Current or Ex-Partner: No   Emotionally Abused: No   Physically Abused: No   Sexually Abused: No     Current Outpatient Medications:    Alcohol Swabs (ALCOHOL PREP) PADS, 1 each by Does not apply route daily., Disp: 300 each, Rfl: 3   aspirin EC 81 MG tablet, Take 81 mg by mouth daily. Swallow whole., Disp: , Rfl:    Blood Glucose Monitoring Suppl (TRUE METRIX AIR GLUCOSE METER) DEVI, Use to check blood sugar 2 times daily, Disp: 1 each, Rfl: 6   cyanocobalamin (,VITAMIN B-12,) 1000 MCG/ML injection, Inject 1 ml subcutaneously once a month, Disp: 3 mL, Rfl: 3   donepezil (ARICEPT) 10 MG tablet, Take 1 tablet (10 mg total) by mouth daily., Disp: 90 tablet, Rfl: 3   finasteride (PROSCAR) 5 MG tablet, Take 1 tablet (5 mg total) by mouth daily., Disp: 90 tablet, Rfl: 3   glipiZIDE (GLUCOTROL) 10 MG tablet, Take 1 tablet (10 mg total) by mouth daily before breakfast. Patient is unsure of strength, asked pt to call back with this information,  Disp: 90 tablet, Rfl: 2   glucose blood (RELION TRUE METRIX TEST STRIPS) test strip, Check sugar 2 times daily, Disp: 300 each, Rfl: 3   Lancets 33G MISC, Check blood sugar 2 times daily, Disp: 300 each, Rfl: 3   lisinopril (ZESTRIL) 40 MG tablet, Take 1 tablet (40 mg total) by mouth daily., Disp: 90 tablet, Rfl: 3   meloxicam (MOBIC) 15 MG tablet, Take 1 tablet (15 mg total) by mouth daily., Disp: 90 tablet, Rfl: 3   memantine (NAMENDA) 10 MG tablet, Take 1 tablet (10 mg total) by mouth 2 (two) times daily., Disp: 180 tablet, Rfl: 0   metFORMIN (GLUCOPHAGE) 500 MG tablet, Take 1 tablet (500 mg total) by mouth 2 (two) times daily with a meal., Disp: 90 tablet, Rfl: 2   omeprazole (PRILOSEC) 20 MG capsule, Take 1 capsule (20 mg total) by mouth daily., Disp:  90 capsule, Rfl: 2   pravastatin (PRAVACHOL) 40 MG tablet, Take 1 tablet (40 mg total) by mouth daily., Disp: 90 tablet, Rfl: 3   spironolactone (ALDACTONE) 25 MG tablet, Take 1 tablet (25 mg total) by mouth daily., Disp: 90 tablet, Rfl: 3   tamsulosin (FLOMAX) 0.4 MG CAPS capsule, Take 1 capsule (0.4 mg total) by mouth daily., Disp: 90 capsule, Rfl: 3   VITAMIN D PO, Take 2,000 Units by mouth daily., Disp: , Rfl:    warfarin (COUMADIN) 5 MG tablet, TAKE 1 TABLET (5 MG TOTAL) BY MOUTH DAILY., Disp: 90 tablet, Rfl: 1   No Known Allergies  ROS Review of Systems  Constitutional: Negative.   HENT: Negative.    Eyes: Negative.   Respiratory: Negative.    Cardiovascular: Negative.   Gastrointestinal: Negative.   Endocrine: Negative.   Genitourinary: Negative.   Musculoskeletal: Negative.   Skin: Negative.   Allergic/Immunologic: Negative.   Neurological: Negative.   Hematological: Negative.   Psychiatric/Behavioral: Negative.    All other systems reviewed and are negative.    Objective:    Physical Exam Vitals reviewed.  Constitutional:      Appearance: Normal appearance.  HENT:     Mouth/Throat:     Mouth: Mucous membranes are moist.  Eyes:     Pupils: Pupils are equal, round, and reactive to light.  Neck:     Vascular: No carotid bruit.  Cardiovascular:     Rate and Rhythm: Normal rate. Rhythm irregular.     Pulses: Normal pulses.     Heart sounds: Normal heart sounds.  Pulmonary:     Effort: Pulmonary effort is normal.     Breath sounds: Normal breath sounds.  Abdominal:     General: Bowel sounds are normal.     Palpations: Abdomen is soft. There is no hepatomegaly, splenomegaly or mass.     Tenderness: There is no abdominal tenderness.     Hernia: No hernia is present.  Musculoskeletal:     Cervical back: Neck supple.     Right lower leg: No edema.     Left lower leg: No edema.  Skin:    Findings: No rash.  Neurological:     Mental Status: He is alert and  oriented to person, place, and time.     Motor: No weakness.  Psychiatric:        Mood and Affect: Mood normal.        Behavior: Behavior normal.  :  BP 139/76    Pulse 85    Ht 5\' 4"  (1.626 m)    Wt 178 lb  8 oz (81 kg)    BMI 30.64 kg/m  Wt Readings from Last 3 Encounters:  09/21/21 178 lb 8 oz (81 kg)  04/21/21 183 lb 8 oz (83.2 kg)  04/20/21 185 lb (83.9 kg)     Health Maintenance Due  Topic Date Due   FOOT EXAM  Never done   OPHTHALMOLOGY EXAM  Never done   Hepatitis C Screening  Never done   Zoster Vaccines- Shingrix (1 of 2) Never done   COVID-19 Vaccine (5 - Booster for Moderna series) 03/01/2021   INFLUENZA VACCINE  04/18/2021    There are no preventive care reminders to display for this patient.  Lab Results  Component Value Date   TSH 0.853 10/22/2020   Lab Results  Component Value Date   WBC 7.5 07/15/2020   HGB 13.0 (L) 07/15/2020   HCT 39.2 07/15/2020   MCV 95.6 07/15/2020   PLT 262 07/15/2020   Lab Results  Component Value Date   NA 140 07/15/2020   K 4.2 07/15/2020   CO2 22 07/15/2020   GLUCOSE 155 (H) 07/15/2020   BUN 23 07/15/2020   CREATININE 1.58 (H) 07/15/2020   BILITOT 0.5 07/15/2020   ALKPHOS 61 12/29/2017   AST 16 07/15/2020   ALT 12 07/15/2020   PROT 6.7 07/15/2020   ALBUMIN 3.8 12/29/2017   CALCIUM 9.2 07/15/2020   ANIONGAP 7 12/29/2017   No results found for: CHOL No results found for: HDL No results found for: LDLCALC No results found for: TRIG No results found for: CHOLHDL Lab Results  Component Value Date   HGBA1C 7.4 (A) 08/29/2021      Assessment & Plan:   Problem List Items Addressed This Visit       Cardiovascular and Mediastinum   Longstanding persistent atrial fibrillation ([place]) - Primary    Patient has atrial fibrillation , controlled.  Chest is clear.  There is no swelling of the leg.  He is tolerating.  dieuretics well Echocardiogram has been scheduled.      Essential hypertension     Patient denies  any chest pain or shortness of breath there is no history of palpitation or paroxysmal nocturnal dyspnea   patient was advised to follow low-salt low-cholesterol diet    ideally I want to keep systolic blood pressure below 130 mmHg, patient was asked to check blood pressure one times a week and give me a report on that.  Patient will be follow-up in 3 months  or earlier as needed, patient will call me back for any change in the cardiovascular symptoms Patient was advised to buy a book from local bookstore concerning blood pressure and read several chapters  every day.  This will be supplemented by some of the material we will give him from the office.  Patient should also utilize other resources like YouTube and Internet to learn more about the blood pressure and the diet.        Endocrine   Diabetes mellitus ([place])    - The patient's blood sugar is labile on med. - The patient will continue the current treatment regimen.  - I encouraged the patient to regularly check blood sugar.  - I encouraged the patient to monitor diet. I encouraged the patient to eat low-carb and low-sugar to help prevent blood sugar spikes.  - I encouraged the patient to continue following their prescribed treatment plan for diabetes - I informed the patient to get help if blood sugar drops below 54mg /dL, or if  suddenly have trouble thinking clearly or breathing.  Patient was advised to buy a book on diabetes from a local bookstore or from [place]  Patient should read 2 chapters every day to keep the motivation going, this is in addition to some of the materials we provided them from the office.  There are other resources on the Internet like YouTube and [employer]kipedia to get an education on the diabetes       No orders of the defined types were placed in this encounter.   Follow-up: No follow-ups on file.    Cletis Athens, MD

## 2021-09-21 NOTE — Assessment & Plan Note (Signed)

## 2021-09-22 DIAGNOSIS — R0602 Shortness of breath: Secondary | ICD-10-CM | POA: Diagnosis not present

## 2021-09-22 DIAGNOSIS — I4891 Unspecified atrial fibrillation: Secondary | ICD-10-CM | POA: Diagnosis not present

## 2021-10-22 ENCOUNTER — Other Ambulatory Visit: Payer: Self-pay | Admitting: Internal Medicine

## 2021-10-26 ENCOUNTER — Ambulatory Visit: Payer: Medicare Other | Admitting: Family Medicine

## 2021-11-21 ENCOUNTER — Encounter: Payer: Self-pay | Admitting: Internal Medicine

## 2021-11-21 ENCOUNTER — Other Ambulatory Visit: Payer: Self-pay

## 2021-11-21 ENCOUNTER — Ambulatory Visit (INDEPENDENT_AMBULATORY_CARE_PROVIDER_SITE_OTHER): Payer: Medicare Other | Admitting: Internal Medicine

## 2021-11-21 VITALS — BP 140/78 | HR 71 | Ht 64.0 in | Wt 187.8 lb

## 2021-11-21 DIAGNOSIS — I10 Essential (primary) hypertension: Secondary | ICD-10-CM

## 2021-11-21 DIAGNOSIS — I4811 Longstanding persistent atrial fibrillation: Secondary | ICD-10-CM

## 2021-11-21 DIAGNOSIS — E1169 Type 2 diabetes mellitus with other specified complication: Secondary | ICD-10-CM

## 2021-11-21 DIAGNOSIS — I4891 Unspecified atrial fibrillation: Secondary | ICD-10-CM

## 2021-11-21 DIAGNOSIS — R413 Other amnesia: Secondary | ICD-10-CM | POA: Diagnosis not present

## 2021-11-21 LAB — GLUCOSE, POCT (MANUAL RESULT ENTRY): POC Glucose: 245 mg/dl — AB (ref 70–99)

## 2021-11-21 LAB — POCT INR: INR: 1.4 — AB (ref 2.0–3.0)

## 2021-11-21 NOTE — Assessment & Plan Note (Signed)

## 2021-11-21 NOTE — Assessment & Plan Note (Signed)

## 2021-11-21 NOTE — Assessment & Plan Note (Signed)
Pro time is low, we will check the pro time again in 2 weeks ?

## 2021-11-21 NOTE — Progress Notes (Signed)
Established Patient Office Visit  Subjective:  Patient ID: Bob Brewer, male    DOB: 12-08-1940  Age: 81 y.o. MRN: [id number]  CC:  Chief Complaint  Patient presents with   Atrial Fibrillation    Atrial Fibrillation Past medical history includes atrial fibrillation.   Bob Brewer presents for general check  Past Medical History:  Diagnosis Date   Diabetes mellitus without complication ([place])    Hypertension    Memory loss     Past Surgical History:  Procedure Laterality Date   BACK SURGERY  1992   also 1994    History reviewed. No pertinent family history.  Social History   Socioeconomic History   Marital status: Married    Spouse name: Hoyle Sauer   Number of children: Not on file   Years of education: Not on file   Highest education level: 12th grade  Occupational History   Occupation: Retired  Tobacco Use   Smoking status: Former    Types: Cigarettes   Smokeless tobacco: Never  Substance and Sexual Activity   Alcohol use: Not Currently   Drug use: Never   Sexual activity: Not Currently  Other Topics Concern   Not on file  Social History Narrative   Life with wife   Right handed   Drinks 5-6 cups caffeine daily   Social Determinants of Health   Financial Resource Strain: Low Risk    Difficulty of Paying Living Expenses: Not hard at all  Food Insecurity: No Food Insecurity   Worried About Charity fundraiser in the Last Year: Never true   Arboriculturist in the Last Year: Never true  Transportation Needs: No Transportation Needs   Lack of Transportation (Medical): No   Lack of Transportation (Non-Medical): No  Physical Activity: Inactive   Days of Exercise per Week: 0 days   Minutes of Exercise per Session: 0 min  Stress: No Stress Concern Present   Feeling of Stress : Not at all  Social Connections: Socially Integrated   Frequency of Communication with Friends and Family: More than three times a week   Frequency of Social Gatherings with  Friends and Family: Twice a week   Attends Religious Services: More than 4 times per year   Active Member of [employer] or Organizations: Yes   Attends Music therapist: More than 4 times per year   Marital Status: Married  Human resources officer Violence: Not At Risk   Fear of Current or Ex-Partner: No   Emotionally Abused: No   Physically Abused: No   Sexually Abused: No     Current Outpatient Medications:    Alcohol Swabs (ALCOHOL PREP) PADS, 1 each by Does not apply route daily., Disp: 300 each, Rfl: 3   Blood Glucose Monitoring Suppl (TRUE METRIX AIR GLUCOSE METER) DEVI, Use to check blood sugar 2 times daily, Disp: 1 each, Rfl: 6   cyanocobalamin (,VITAMIN B-12,) 1000 MCG/ML injection, Inject 1 ml subcutaneously once a month, Disp: 3 mL, Rfl: 3   donepezil (ARICEPT) 10 MG tablet, Take 1 tablet (10 mg total) by mouth daily., Disp: 90 tablet, Rfl: 3   finasteride (PROSCAR) 5 MG tablet, Take 1 tablet (5 mg total) by mouth daily., Disp: 90 tablet, Rfl: 3   glipiZIDE (GLUCOTROL) 10 MG tablet, Take 1 tablet (10 mg total) by mouth daily before breakfast. Patient is unsure of strength, asked pt to call back with this information, Disp: 90 tablet, Rfl: 2   glucose blood (RELION  TRUE METRIX TEST STRIPS) test strip, Check sugar 2 times daily, Disp: 300 each, Rfl: 3   Lancets 33G MISC, Check blood sugar 2 times daily, Disp: 300 each, Rfl: 3   lisinopril (ZESTRIL) 40 MG tablet, Take 1 tablet (40 mg total) by mouth daily., Disp: 90 tablet, Rfl: 3   memantine (NAMENDA) 10 MG tablet, Take 1 tablet (10 mg total) by mouth 2 (two) times daily., Disp: 180 tablet, Rfl: 0   metFORMIN (GLUCOPHAGE) 500 MG tablet, Take 1 tablet (500 mg total) by mouth 2 (two) times daily with a meal., Disp: 90 tablet, Rfl: 2   omeprazole (PRILOSEC) 20 MG capsule, TAKE 1 CAPSULE BY MOUTH  DAILY, Disp: 90 capsule, Rfl: 3   pravastatin (PRAVACHOL) 40 MG tablet, Take 1 tablet (40 mg total) by mouth daily., Disp: 90 tablet, Rfl:  3   spironolactone (ALDACTONE) 25 MG tablet, Take 1 tablet (25 mg total) by mouth daily., Disp: 90 tablet, Rfl: 3   tamsulosin (FLOMAX) 0.4 MG CAPS capsule, Take 1 capsule (0.4 mg total) by mouth daily., Disp: 90 capsule, Rfl: 3   VITAMIN D PO, Take 2,000 Units by mouth daily., Disp: , Rfl:    warfarin (COUMADIN) 5 MG tablet, TAKE 1 TABLET (5 MG TOTAL) BY MOUTH DAILY., Disp: 90 tablet, Rfl: 1   No Known Allergies  ROS Review of Systems  Constitutional: Negative.  Negative for chills and fatigue.  HENT: Negative.    Eyes: Negative.   Respiratory: Negative.    Cardiovascular: Negative.   Gastrointestinal: Negative.   Endocrine: Negative.   Genitourinary: Negative.   Musculoskeletal: Negative.   Skin: Negative.   Allergic/Immunologic: Negative.   Neurological: Negative.  Negative for speech difficulty and headaches.  Hematological: Negative.   Psychiatric/Behavioral:  Positive for sleep disturbance. Negative for self-injury. The patient is not nervous/anxious.        Loss of memory  All other systems reviewed and are negative.    Objective:    Physical Exam Vitals reviewed.  Constitutional:      Appearance: Normal appearance.  HENT:     Mouth/Throat:     Mouth: Mucous membranes are moist.  Eyes:     Pupils: Pupils are equal, round, and reactive to light.  Neck:     Vascular: No carotid bruit.  Cardiovascular:     Rate and Rhythm: Normal rate and regular rhythm.     Pulses: Normal pulses.     Heart sounds: Normal heart sounds.  Pulmonary:     Effort: Pulmonary effort is normal.     Breath sounds: Normal breath sounds.  Abdominal:     General: Bowel sounds are normal.     Palpations: Abdomen is soft. There is no hepatomegaly, splenomegaly or mass.     Tenderness: There is no abdominal tenderness.     Hernia: No hernia is present.  Musculoskeletal:     Cervical back: Neck supple.     Right lower leg: No edema.     Left lower leg: No edema.  Skin:    Findings: No  rash.  Neurological:     Mental Status: He is alert and oriented to person, place, and time.     Motor: No weakness.  Psychiatric:        Mood and Affect: Mood normal.        Behavior: Behavior normal.    BP 140/78    Pulse 71    Ht '5\' 4"'$  (1.626 m)    Wt 187 lb 12.8  oz (85.2 kg)    BMI 32.24 kg/m  Wt Readings from Last 3 Encounters:  11/21/21 187 lb 12.8 oz (85.2 kg)  09/21/21 178 lb 8 oz (81 kg)  04/21/21 183 lb 8 oz (83.2 kg)     Health Maintenance Due  Topic Date Due   FOOT EXAM  Never done   OPHTHALMOLOGY EXAM  Never done   Hepatitis C Screening  Never done   Zoster Vaccines- Shingrix (1 of 2) Never done   COVID-19 Vaccine (5 - Booster for Moderna series) 03/01/2021   INFLUENZA VACCINE  04/18/2021    There are no preventive care reminders to display for this patient.  Lab Results  Component Value Date   TSH 0.853 10/22/2020   Lab Results  Component Value Date   WBC 7.5 07/15/2020   HGB 13.0 (L) 07/15/2020   HCT 39.2 07/15/2020   MCV 95.6 07/15/2020   PLT 262 07/15/2020   Lab Results  Component Value Date   NA 140 07/15/2020   K 4.2 07/15/2020   CO2 22 07/15/2020   GLUCOSE 155 (H) 07/15/2020   BUN 23 07/15/2020   CREATININE 1.58 (H) 07/15/2020   BILITOT 0.5 07/15/2020   ALKPHOS 61 12/29/2017   AST 16 07/15/2020   ALT 12 07/15/2020   PROT 6.7 07/15/2020   ALBUMIN 3.8 12/29/2017   CALCIUM 9.2 07/15/2020   ANIONGAP 7 12/29/2017   No results found for: CHOL No results found for: HDL No results found for: LDLCALC No results found for: TRIG No results found for: CHOLHDL Lab Results  Component Value Date   HGBA1C 7.4 (A) 08/29/2021      Assessment & Plan:   Problem List Items Addressed This Visit       Cardiovascular and Mediastinum   Longstanding persistent atrial fibrillation ([place])    Pro time is low, we will check the pro time again in 2 weeks      Essential hypertension     Patient denies any chest pain or shortness of breath there is  no history of palpitation or paroxysmal nocturnal dyspnea   patient was advised to follow low-salt low-cholesterol diet    ideally I want to keep systolic blood pressure below 130 mmHg, patient was asked to check blood pressure one times a week and give me a report on that.  Patient will be follow-up in 3 months  or earlier as needed, patient will call me back for any change in the cardiovascular symptoms Patient was advised to buy a book from local bookstore concerning blood pressure and read several chapters  every day.  This will be supplemented by some of the material we will give him from the office.  Patient should also utilize other resources like YouTube and Internet to learn more about the blood pressure and the diet.        Endocrine   Diabetes mellitus ([place]) - Primary    - The patient's blood sugar is labile on med. - The patient will continue the current treatment regimen.  - I encouraged the patient to regularly check blood sugar.  - I encouraged the patient to monitor diet. I encouraged the patient to eat low-carb and low-sugar to help prevent blood sugar spikes.  - I encouraged the patient to continue following their prescribed treatment plan for diabetes - I informed the patient to get help if blood sugar drops below '54mg'$ /dL, or if suddenly have trouble thinking clearly or breathing.  Patient was advised to buy a  book on diabetes from a local bookstore or from [place]  Patient should read 2 chapters every day to keep the motivation going, this is in addition to some of the materials we provided them from the office.  There are other resources on the Internet like YouTube and wilkipedia to get an education on the diabetes      Relevant Orders   POCT glucose (manual entry) (Completed)     Other   Memory loss    Stable at the present time      Other Visit Diagnoses     Atrial fibrillation, unspecified type ([place])       Relevant Orders   POCT INR (Completed)       No  orders of the defined types were placed in this encounter.   Follow-up: No follow-ups on file.    Cletis [place], MD

## 2021-11-21 NOTE — Assessment & Plan Note (Signed)
Stable at the present time. 

## 2021-11-23 ENCOUNTER — Other Ambulatory Visit: Payer: Self-pay | Admitting: Neurology

## 2021-11-24 ENCOUNTER — Other Ambulatory Visit: Payer: Self-pay | Admitting: Internal Medicine

## 2021-11-28 ENCOUNTER — Other Ambulatory Visit: Payer: Self-pay | Admitting: *Deleted

## 2021-11-28 DIAGNOSIS — I10 Essential (primary) hypertension: Secondary | ICD-10-CM

## 2021-11-28 MED ORDER — WARFARIN SODIUM 5 MG PO TABS
5.0000 mg | ORAL_TABLET | Freq: Every day | ORAL | 3 refills | Status: DC
Start: 2021-11-28 — End: 2021-12-12

## 2021-11-28 MED ORDER — SPIRONOLACTONE 25 MG PO TABS: 25.0000 mg | ORAL_TABLET | Freq: Every day | ORAL | 3 refills | Status: DC

## 2021-11-30 DIAGNOSIS — E119 Type 2 diabetes mellitus without complications: Secondary | ICD-10-CM | POA: Diagnosis not present

## 2021-12-06 ENCOUNTER — Other Ambulatory Visit: Payer: Self-pay

## 2021-12-08 ENCOUNTER — Other Ambulatory Visit: Payer: Self-pay | Admitting: Internal Medicine

## 2021-12-08 DIAGNOSIS — R413 Other amnesia: Secondary | ICD-10-CM

## 2021-12-08 MED ORDER — DONEPEZIL HCL 10 MG PO TABS: 10.0000 mg | ORAL_TABLET | Freq: Every day | ORAL | 3 refills | Status: DC

## 2021-12-12 ENCOUNTER — Other Ambulatory Visit: Payer: Self-pay

## 2021-12-12 ENCOUNTER — Ambulatory Visit (INDEPENDENT_AMBULATORY_CARE_PROVIDER_SITE_OTHER): Payer: Medicare Other | Admitting: Internal Medicine

## 2021-12-12 ENCOUNTER — Encounter: Payer: Self-pay | Admitting: Internal Medicine

## 2021-12-12 VITALS — BP 136/83 | HR 82 | Ht 64.0 in | Wt 181.7 lb

## 2021-12-12 DIAGNOSIS — R413 Other amnesia: Secondary | ICD-10-CM | POA: Diagnosis not present

## 2021-12-12 DIAGNOSIS — I4811 Longstanding persistent atrial fibrillation: Secondary | ICD-10-CM

## 2021-12-12 DIAGNOSIS — E1169 Type 2 diabetes mellitus with other specified complication: Secondary | ICD-10-CM | POA: Diagnosis not present

## 2021-12-12 DIAGNOSIS — I10 Essential (primary) hypertension: Secondary | ICD-10-CM

## 2021-12-12 DIAGNOSIS — I4891 Unspecified atrial fibrillation: Secondary | ICD-10-CM | POA: Diagnosis not present

## 2021-12-12 LAB — POCT INR: INR: 1.6 — AB (ref 2.0–3.0)

## 2021-12-12 MED ORDER — WARFARIN SODIUM 5 MG PO TABS: 5.0000 mg | ORAL_TABLET | Freq: Every day | ORAL | 3 refills | Status: DC

## 2021-12-12 MED ORDER — WARFARIN SODIUM 2.5 MG PO TABS: 2.5000 mg | ORAL_TABLET | ORAL | 3 refills | Status: DC

## 2021-12-12 MED ORDER — WARFARIN SODIUM 2.5 MG PO TABS: 2.5000 mg | ORAL_TABLET | ORAL | 0 refills | Status: DC

## 2021-12-12 NOTE — Addendum Note (Signed)
Addended by: Alois Cliche on: 12/12/2021 03:25 PM ? ? Modules accepted: Orders ? ?

## 2021-12-12 NOTE — Progress Notes (Signed)
? ?Established Patient Office Visit ? ?Subjective:  ?Patient ID: Bob Brewer, male    DOB: [birth year]  Age: 81 y.o. MRN: [id number] ? ?CC:  ?Chief Complaint  ?Patient presents with  ? Atrial Fibrillation  ? ? ?Atrial Fibrillation ?Past medical history includes atrial fibrillation.  ? ?Bob Brewer presents for check up ? ?Past Medical History:  ?Diagnosis Date  ? Diabetes mellitus without complication ([place])   ? Hypertension   ? Memory loss   ? ? ?Past Surgical History:  ?Procedure Laterality Date  ? BACK SURGERY  1992  ? also 1994  ? ? ?History reviewed. No pertinent family history. ? ?Social History  ? ?Socioeconomic History  ? Marital status: Married  ?  Spouse name: Hoyle Sauer  ? Number of children: Not on file  ? Years of education: Not on file  ? Highest education level: 12th grade  ?Occupational History  ? Occupation: Retired  ?Tobacco Use  ? Smoking status: Former  ?  Types: Cigarettes  ? Smokeless tobacco: Never  ?Substance and Sexual Activity  ? Alcohol use: Not Currently  ? Drug use: Never  ? Sexual activity: Not Currently  ?Other Topics Concern  ? Not on file  ?Social History Narrative  ? Life with wife  ? Right handed  ? Drinks 5-6 cups caffeine daily  ? ?Social Determinants of Health  ? ?Financial Resource Strain: Low Risk   ? Difficulty of Paying Living Expenses: Not hard at all  ?Food Insecurity: No Food Insecurity  ? Worried About Charity fundraiser in the Last Year: Never true  ? Ran Out of Food in the Last Year: Never true  ?Transportation Needs: No Transportation Needs  ? Lack of Transportation (Medical): No  ? Lack of Transportation (Non-Medical): No  ?Physical Activity: Inactive  ? Days of Exercise per Week: 0 days  ? Minutes of Exercise per Session: 0 min  ?Stress: No Stress Concern Present  ? Feeling of Stress : Not at all  ?Social Connections: Socially Integrated  ? Frequency of Communication with Friends and Family: More than three times a week  ? Frequency of Social Gatherings with  Friends and Family: Twice a week  ? Attends Religious Services: More than 4 times per year  ? Active Member of Clubs or Organizations: Yes  ? Attends Archivist Meetings: More than 4 times per year  ? Marital Status: Married  ?Intimate Partner Violence: Not At Risk  ? Fear of Current or Ex-Partner: No  ? Emotionally Abused: No  ? Physically Abused: No  ? Sexually Abused: No  ? ? ? ?Current Outpatient Medications:  ?  Alcohol Swabs (ALCOHOL PREP) PADS, 1 each by Does not apply route daily., Disp: 300 each, Rfl: 3 ?  Blood Glucose Monitoring Suppl (TRUE METRIX AIR GLUCOSE METER) DEVI, Use to check blood sugar 2 times daily, Disp: 1 each, Rfl: 6 ?  cyanocobalamin (,VITAMIN B-12,) 1000 MCG/ML injection, Inject 1 ml subcutaneously once a month, Disp: 3 mL, Rfl: 3 ?  donepezil (ARICEPT) 10 MG tablet, Take 1 tablet (10 mg total) by mouth daily., Disp: 90 tablet, Rfl: 3 ?  finasteride (PROSCAR) 5 MG tablet, Take 1 tablet (5 mg total) by mouth daily., Disp: 90 tablet, Rfl: 3 ?  furosemide (LASIX) 40 MG tablet, Take 40 mg by mouth., Disp: , Rfl:  ?  glucose blood (RELION TRUE METRIX TEST STRIPS) test strip, Check sugar 2 times daily, Disp: 300 each, Rfl: 3 ?  Lancets 33G  MISC, Check blood sugar 2 times daily, Disp: 300 each, Rfl: 3 ?  lisinopril (ZESTRIL) 40 MG tablet, TAKE 1 TABLET BY MOUTH  DAILY, Disp: 90 tablet, Rfl: 3 ?  meloxicam (MOBIC) 15 MG tablet, Take 15 mg by mouth daily., Disp: , Rfl:  ?  memantine (NAMENDA) 10 MG tablet, Take 1 tablet (10 mg total) by mouth 2 (two) times daily. Please call and make overdue appt for further refills, 1st attempt, Disp: 60 tablet, Rfl: 0 ?  metFORMIN (GLUCOPHAGE) 500 MG tablet, TAKE 1 TABLET BY MOUTH TWICE  DAILY WITH A MEAL, Disp: 180 tablet, Rfl: 3 ?  metoprolol succinate (TOPROL-XL) 25 MG 24 hr tablet, Take 25 mg by mouth daily., Disp: , Rfl:  ?  omeprazole (PRILOSEC) 20 MG capsule, TAKE 1 CAPSULE BY MOUTH  DAILY, Disp: 90 capsule, Rfl: 3 ?  pravastatin (PRAVACHOL) 40  MG tablet, Take 1 tablet (40 mg total) by mouth daily., Disp: 90 tablet, Rfl: 3 ?  spironolactone (ALDACTONE) 25 MG tablet, Take 1 tablet (25 mg total) by mouth daily., Disp: 90 tablet, Rfl: 3 ?  tamsulosin (FLOMAX) 0.4 MG CAPS capsule, Take 1 capsule (0.4 mg total) by mouth daily., Disp: 90 capsule, Rfl: 3 ?  VITAMIN D PO, Take 2,000 Units by mouth daily., Disp: , Rfl:  ?  warfarin (COUMADIN) 2.5 MG tablet, Take 1 tablet (2.5 mg total) by mouth 2 (two) times a week. Take 2.'5mg'$  in addition to the '5mg'$  on Saturday and Sunday, Disp: 90 tablet, Rfl: 0 ?  warfarin (COUMADIN) 5 MG tablet, Take 1 tablet (5 mg total) by mouth daily. Take 5 mg Monday - Friday, Disp: 90 tablet, Rfl: 3  ? ?No Known Allergies ? ?ROS ?Review of Systems  ?Constitutional: Negative.   ?HENT: Negative.    ?Eyes: Negative.   ?Respiratory: Negative.    ?Cardiovascular: Negative.   ?Gastrointestinal: Negative.   ?Endocrine: Negative.   ?Genitourinary: Negative.   ?Musculoskeletal: Negative.   ?Skin: Negative.   ?Allergic/Immunologic: Negative.   ?Neurological: Negative.   ?Hematological: Negative.   ?Psychiatric/Behavioral: Negative.    ?All other systems reviewed and are negative. ? ?  ?Objective:  ?  ?Physical Exam ?Vitals reviewed.  ?Constitutional:   ?   Appearance: Normal appearance.  ?HENT:  ?   Mouth/Throat:  ?   Mouth: Mucous membranes are moist.  ?Eyes:  ?   Pupils: Pupils are equal, round, and reactive to light.  ?Neck:  ?   Vascular: No carotid bruit.  ?Cardiovascular:  ?   Rate and Rhythm: Normal rate and regular rhythm.  ?   Pulses: Normal pulses.  ?   Heart sounds: Normal heart sounds.  ?Pulmonary:  ?   Effort: Pulmonary effort is normal.  ?   Breath sounds: Normal breath sounds.  ?Abdominal:  ?   General: Bowel sounds are normal.  ?   Palpations: Abdomen is soft. There is no hepatomegaly, splenomegaly or mass.  ?   Tenderness: There is no abdominal tenderness.  ?   Hernia: No hernia is present.  ?Musculoskeletal:  ?   Cervical back: Neck  supple.  ?   Right lower leg: No edema.  ?   Left lower leg: No edema.  ?Skin: ?   Findings: No rash.  ?Neurological:  ?   Mental Status: He is alert and oriented to person, place, and time.  ?   Motor: No weakness.  ?Psychiatric:     ?   Mood and Affect: Mood normal.     ?  Behavior: Behavior normal.  ? ? ?BP 136/83   Pulse 82   Ht '5\' 4"'$  (1.626 m)   Wt 181 lb 11.2 oz (82.4 kg)   BMI 31.19 kg/m?  ?Wt Readings from Last 3 Encounters:  ?12/12/21 181 lb 11.2 oz (82.4 kg)  ?11/21/21 187 lb 12.8 oz (85.2 kg)  ?09/21/21 178 lb 8 oz (81 kg)  ? ? ? ?Health Maintenance Due  ?Topic Date Due  ? FOOT EXAM  Never done  ? OPHTHALMOLOGY EXAM  Never done  ? Zoster Vaccines- Shingrix (1 of 2) Never done  ? COVID-19 Vaccine (5 - Booster for Moderna series) 03/01/2021  ? INFLUENZA VACCINE  04/18/2021  ? ? ?There are no preventive care reminders to display for this patient. ? ?Lab Results  ?Component Value Date  ? TSH 0.853 10/22/2020  ? ?Lab Results  ?Component Value Date  ? WBC 7.5 07/15/2020  ? HGB 13.0 (L) 07/15/2020  ? HCT 39.2 07/15/2020  ? MCV 95.6 07/15/2020  ? PLT 262 07/15/2020  ? ?Lab Results  ?Component Value Date  ? NA 140 07/15/2020  ? K 4.2 07/15/2020  ? CO2 22 07/15/2020  ? GLUCOSE 155 (H) 07/15/2020  ? BUN 23 07/15/2020  ? CREATININE 1.58 (H) 07/15/2020  ? BILITOT 0.5 07/15/2020  ? ALKPHOS 61 12/29/2017  ? AST 16 07/15/2020  ? ALT 12 07/15/2020  ? PROT 6.7 07/15/2020  ? ALBUMIN 3.8 12/29/2017  ? CALCIUM 9.2 07/15/2020  ? ANIONGAP 7 12/29/2017  ? ?No results found for: CHOL ?No results found for: HDL ?No results found for: [place] ?No results found for: TRIG ?No results found for: CHOLHDL ?Lab Results  ?Component Value Date  ? HGBA1C 7.4 (A) 08/29/2021  ? ? ?  ?Assessment & Plan:  ? ?Problem List Items Addressed This Visit   ? ?  ? Cardiovascular and Mediastinum  ? Longstanding persistent atrial fibrillation ([place])  ?  Patient was advised to take Coumadin 5 mg p.o. daily on the weekdays and 7.5 mg p.o. over the  weekend Saturday and Sunday. ?  ?  ? Relevant Medications  ? metoprolol succinate (TOPROL-XL) 25 MG 24 hr tablet  ? furosemide (LASIX) 40 MG tablet  ? warfarin (COUMADIN) 5 MG tablet  ? warfarin (COUMADIN) 2.5 M

## 2021-12-12 NOTE — Assessment & Plan Note (Signed)

## 2021-12-12 NOTE — Assessment & Plan Note (Signed)
Stable at the present time. 

## 2021-12-12 NOTE — Assessment & Plan Note (Signed)

## 2021-12-12 NOTE — Assessment & Plan Note (Signed)
Patient was advised to take Coumadin 5 mg p.o. daily on the weekdays and 7.5 mg p.o. over the weekend Saturday and Sunday. ?

## 2021-12-14 ENCOUNTER — Other Ambulatory Visit: Payer: Self-pay | Admitting: Internal Medicine

## 2022-01-06 ENCOUNTER — Other Ambulatory Visit: Payer: Self-pay | Admitting: Neurology

## 2022-01-06 ENCOUNTER — Other Ambulatory Visit: Payer: Self-pay | Admitting: Internal Medicine

## 2022-01-07 ENCOUNTER — Other Ambulatory Visit: Payer: Self-pay | Admitting: Internal Medicine

## 2022-01-12 ENCOUNTER — Ambulatory Visit (INDEPENDENT_AMBULATORY_CARE_PROVIDER_SITE_OTHER): Payer: Medicare Other

## 2022-01-12 DIAGNOSIS — I4891 Unspecified atrial fibrillation: Secondary | ICD-10-CM

## 2022-01-12 LAB — POCT INR: INR: 2.1 (ref 2.0–3.0)

## 2022-01-12 MED ORDER — FUROSEMIDE 40 MG PO TABS: 40.0000 mg | ORAL_TABLET | Freq: Every day | ORAL | 2 refills | Status: DC

## 2022-01-12 MED ORDER — METOPROLOL SUCCINATE ER 25 MG PO TB24: 25.0000 mg | ORAL_TABLET | Freq: Every day | ORAL | 2 refills | Status: DC

## 2022-02-27 IMAGING — MR MR HEAD W/O CM
12 series · 46 of 48 positions shown · non-contrast
Comparison: Head CT 05/14/2005

CLINICAL DATA: Memory loss.

EXAM:
MRI HEAD WITHOUT CONTRAST
TECHNIQUE: Multiplanar, multiecho pulse sequences of the brain and surrounding
structures were obtained without intravenous contrast.

[Series 5: ax dwi_tracew · axial · 3.0mm · 0.60mm/px · z∈[-107,+39]mm · 3 of 46 slices shown]
[im 1/46]
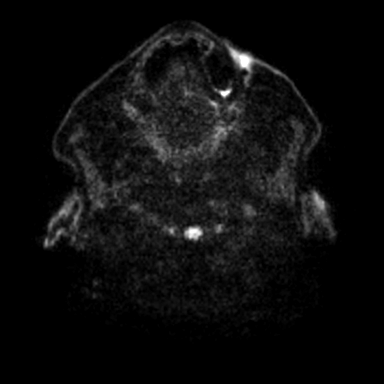
[im 23/46]
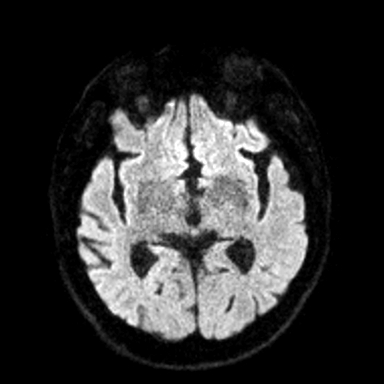
[im 46/46]
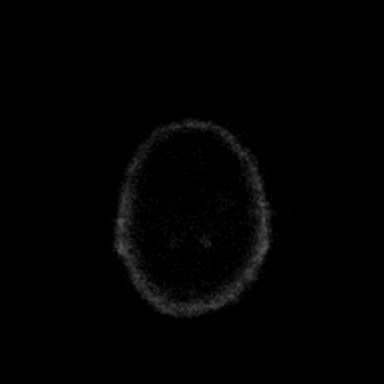

[Series 6: ax dwi_adc · axial · 3.0mm · 0.60mm/px · z∈[-107,+39]mm · 3 of 46 slices shown]
[im 1/46]
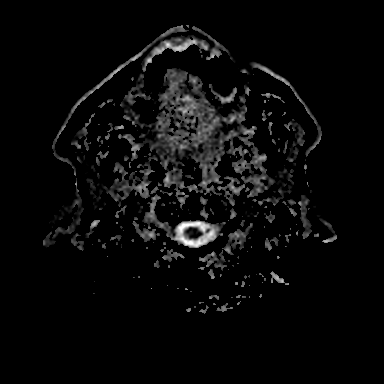
[im 23/46]
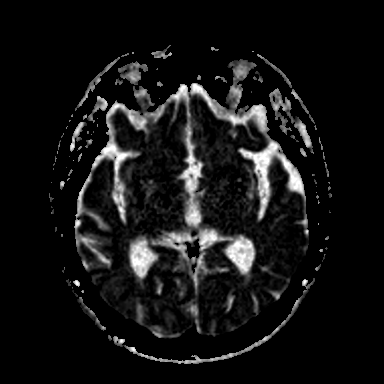
[im 46/46]
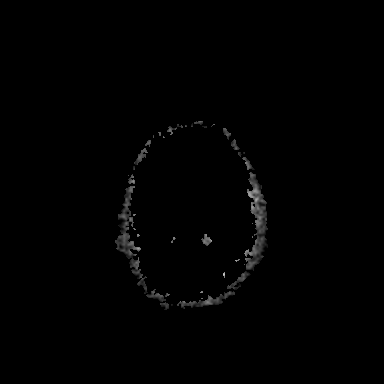

[Series 7: cor dwi_tracew · coronal · 5.0mm · 0.60mm/px · 3 of 36 slices shown]
[im 1/36]
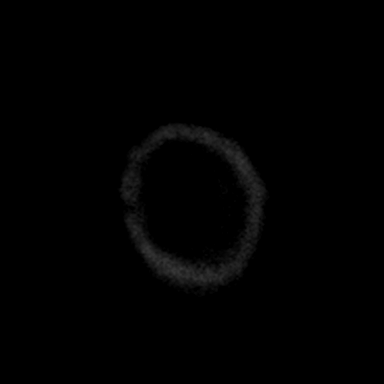
[im 18/36]
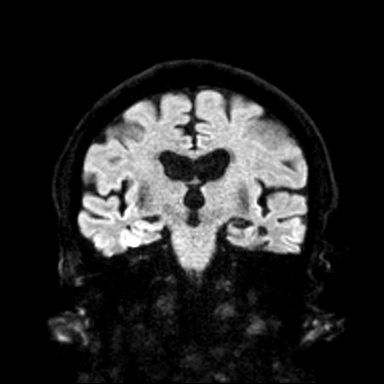
[im 36/36]
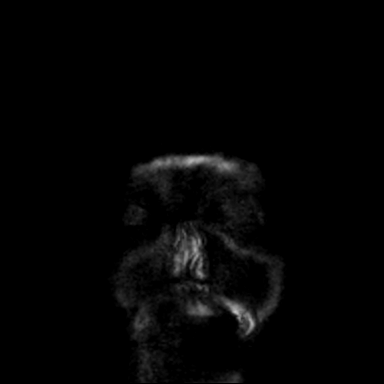

[Series 8: cor dwi_adc · coronal · 5.0mm · 0.60mm/px · 3 of 36 slices shown]
[im 1/36]
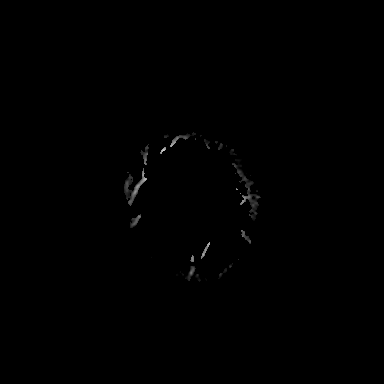
[im 18/36]
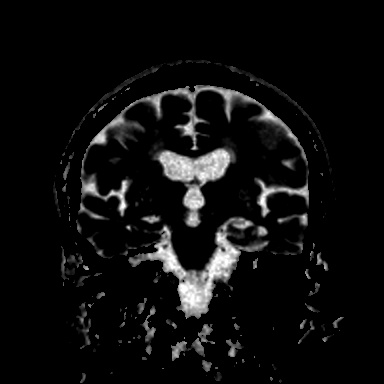
[im 36/36]
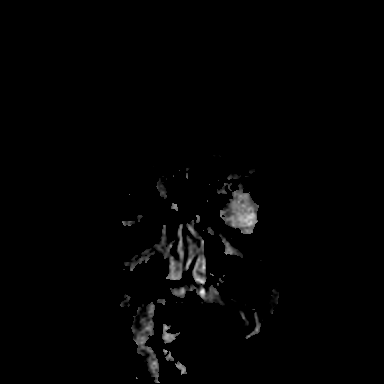

[Series 9: T1 · sagittal · 5.0mm · 0.62mm/px · 2 of 23 slices shown (1 of 2)]
[im 1/23]
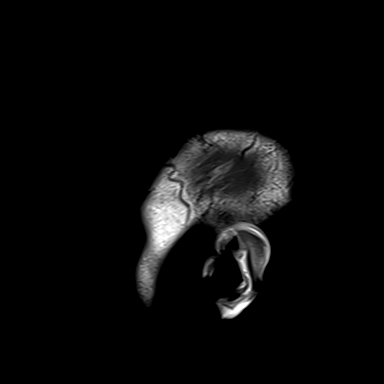
[im 23/23]
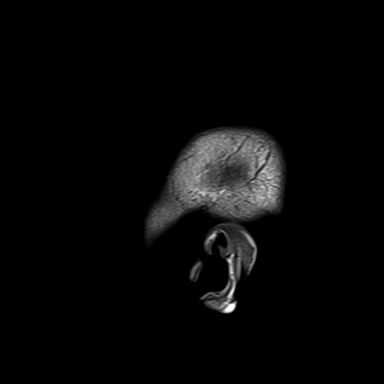

[Series 10: T2 · axial · 5.0mm · 0.53mm/px · z∈[-104,+37]mm · 2 of 25 slices shown (1 of 2)]
[im 1/25]
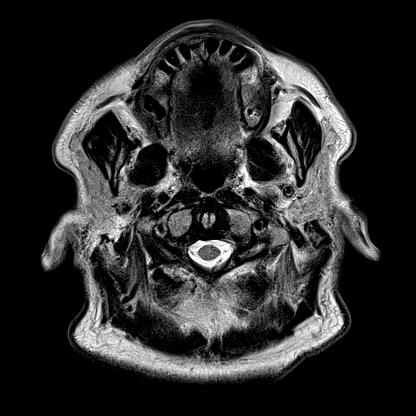
[im 25/25]
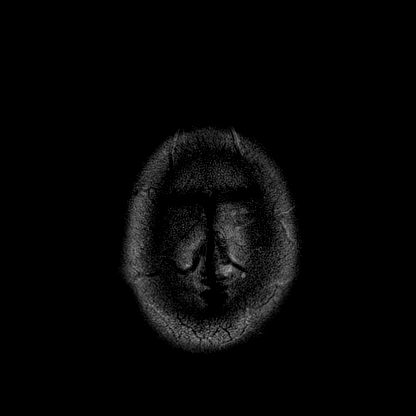

[Series 11: mag_images · axial · 3.0mm · 0.90mm/px · z∈[-119,+55]mm · 5 of 60 slices shown]
[im 1/60]
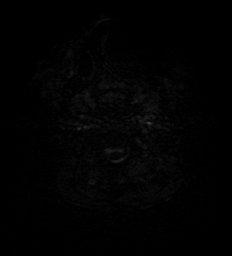
[im 15/60]
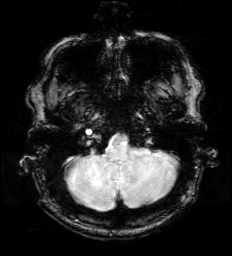
[im 30/60]
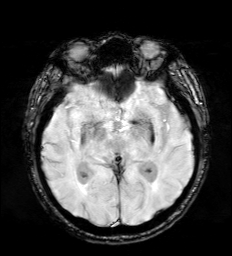
[im 45/60]
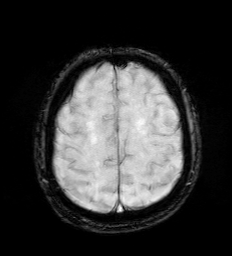
[im 60/60]
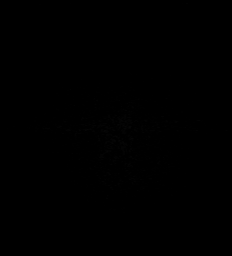

[Series 12: pha_images · axial · 3.0mm · 0.90mm/px · z∈[-119,+49]mm · 4 of 58 slices shown]
[im 1/58]
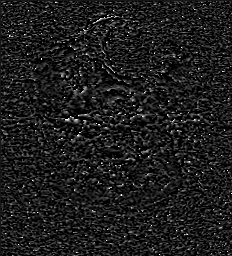
[im 20/58]
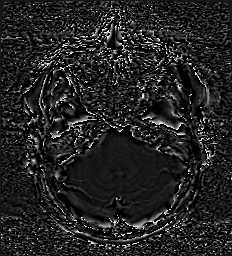
[im 39/58]
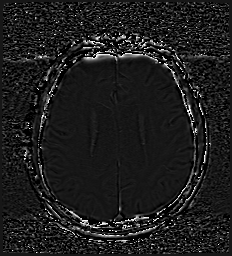
[im 58/58]
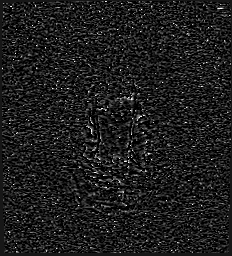

[Series 13: swi_images · axial · 3.0mm · 0.90mm/px · z∈[-119,+55]mm · 5 of 60 slices shown]
[im 1/60]
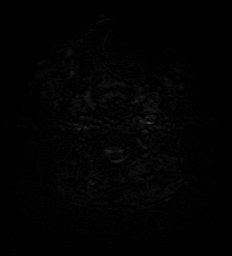
[im 15/60]
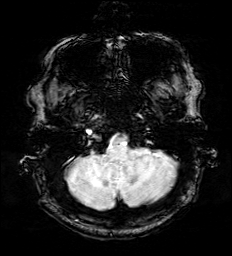
[im 30/60]
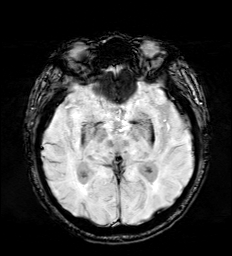
[im 45/60]
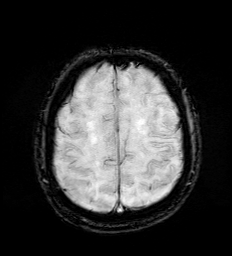
[im 60/60]
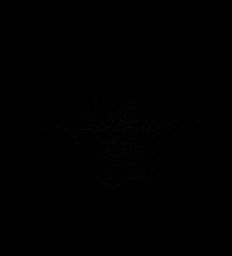

[Series 15: FLAIR · axial · 3.0mm · 0.53mm/px · z∈[-113,+46]mm · 4 of 55 slices shown]
[im 1/55]
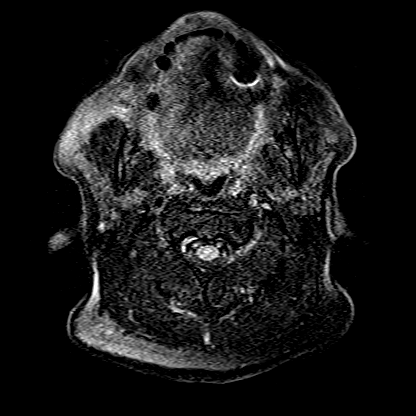
[im 19/55]
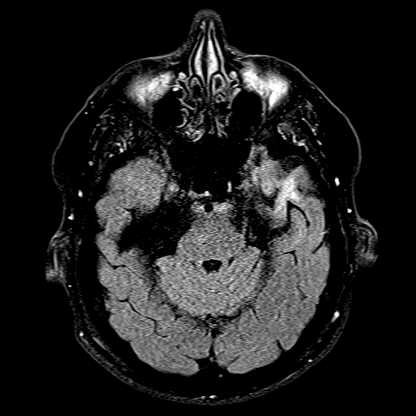
[im 37/55]
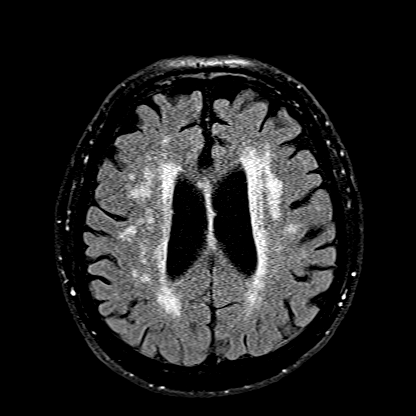
[im 55/55]
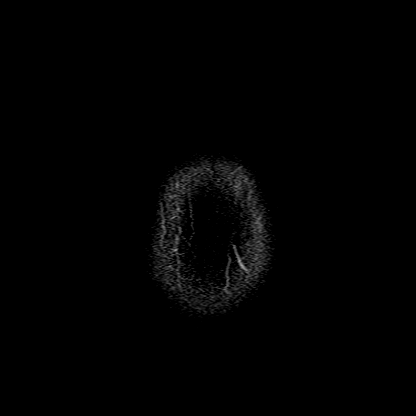

[Series 16: T1 · axial · 1.0mm · 0.98mm/px · z∈[-108,+47]mm · 10 of 160 slices shown (2 of 2)]
[im 1/160]
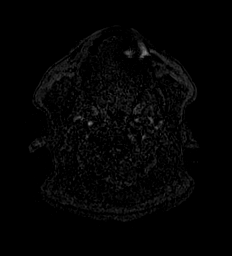
[im 15/160]
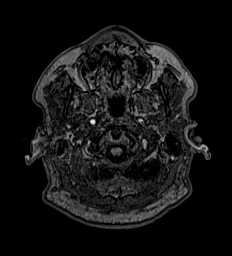
[im 29/160]
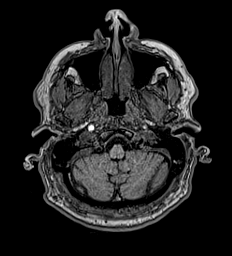
[im 44/160]
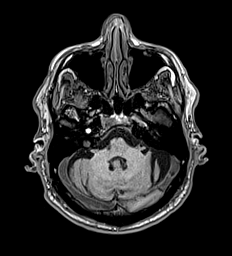
[im 58/160]
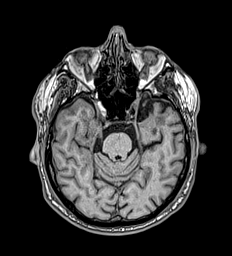
[im 73/160]
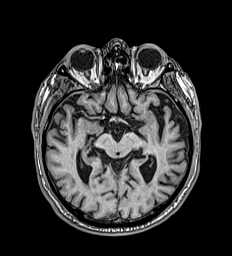
[im 87/160]
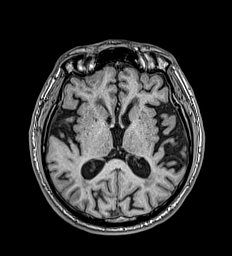
[im 116/160]
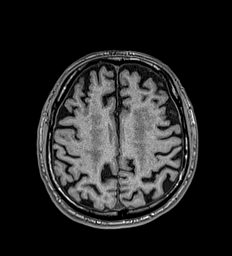
[im 131/160]
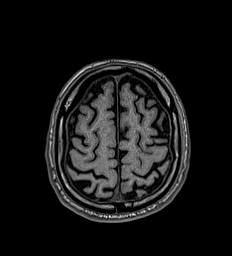
[im 160/160]
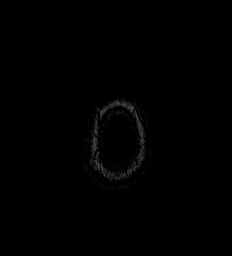

[Series 17: T2 · coronal · 5.0mm · 0.57mm/px · 2 of 26 slices shown (2 of 2)]
[im 1/26]
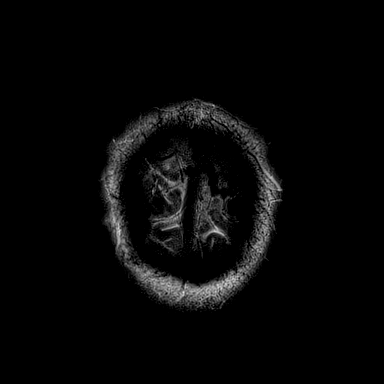
[im 26/26]
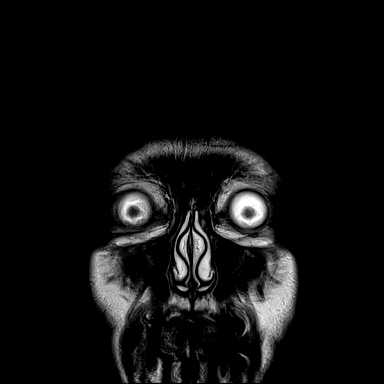

[46 of 48 positions shown; findings below may reference images not displayed]

FINDINGS: Brain: There is no evidence of acute infarct, intracranial
hemorrhage, mass, midline shift, or extra-axial fluid collection.
Patchy T2 hyperintensities in the cerebral white bilaterally are
nonspecific but compatible with moderate chronic small vessel
ischemic disease. There is encephalomalacia anteriorly in the left
temporal lobe.

Vascular: Major intracranial vascular flow voids are preserved.

Skull and upper cervical spine: Unremarkable bone marrow signal.

Sinuses/Orbits: Unremarkable orbits. Paranasal sinuses and mastoid
air cells are clear.

Other: None.
IMPRESSION: 1. No acute intracranial abnormality.
2. Moderate chronic small vessel ischemic disease and cerebral
atrophy.
3. Left temporal lobe encephalomalacia which may be postischemic or
posttraumatic.

## 2022-02-28 ENCOUNTER — Other Ambulatory Visit: Payer: Self-pay | Admitting: *Deleted

## 2022-05-25 ENCOUNTER — Other Ambulatory Visit: Payer: Self-pay | Admitting: *Deleted

## 2022-05-25 MED ORDER — CYANOCOBALAMIN 1000 MCG/ML IJ SOLN: INTRAMUSCULAR | 3 refills | Status: DC

## 2022-06-28 ENCOUNTER — Other Ambulatory Visit: Payer: Self-pay | Admitting: Neurology

## 2022-07-03 ENCOUNTER — Other Ambulatory Visit: Payer: Self-pay | Admitting: Neurology

## 2022-07-03 ENCOUNTER — Other Ambulatory Visit: Payer: Self-pay

## 2022-07-03 ENCOUNTER — Other Ambulatory Visit: Payer: Self-pay | Admitting: Internal Medicine

## 2022-07-03 DIAGNOSIS — I10 Essential (primary) hypertension: Secondary | ICD-10-CM

## 2022-07-03 MED ORDER — MEMANTINE HCL 10 MG PO TABS: 10.0000 mg | ORAL_TABLET | Freq: Two times a day (BID) | ORAL | 0 refills | Status: DC

## 2022-07-12 ENCOUNTER — Telehealth: Payer: Self-pay | Admitting: Family Medicine

## 2022-07-12 NOTE — Telephone Encounter (Signed)
LVM and sent mychart msg informing pt of need to reschedule 10/31 appointment - NP out

## 2022-07-18 ENCOUNTER — Telehealth: Payer: Self-pay | Admitting: Neurology

## 2022-07-18 ENCOUNTER — Other Ambulatory Visit: Payer: Self-pay | Admitting: Neurology

## 2022-07-18 ENCOUNTER — Ambulatory Visit: Payer: Medicare Other | Admitting: Family Medicine

## 2022-07-18 MED ORDER — MEMANTINE HCL 10 MG PO TABS: 10.0000 mg | ORAL_TABLET | Freq: Two times a day (BID) | ORAL | 0 refills | Status: DC

## 2022-07-18 NOTE — Telephone Encounter (Signed)
Sent a 30 day supply for the pt to make sure has enough medication

## 2022-07-18 NOTE — Telephone Encounter (Signed)
pT REQUESTING REFILL OF memantine (NAMENDA) 10 MG tablet AT [id number]

## 2022-07-18 NOTE — Telephone Encounter (Signed)
Refill was sent to the pharmacy for the patientat [facility].  The patient was scheduled for a follow up apt on 11/7 with Dr Felecia Shelling and should have enough medication to last until that 11/7 apt. At that visit we will send updated refill

## 2022-07-19 ENCOUNTER — Ambulatory Visit: Payer: Medicare Other | Admitting: Neurology

## 2022-07-25 ENCOUNTER — Encounter: Payer: Self-pay | Admitting: Neurology

## 2022-07-25 ENCOUNTER — Ambulatory Visit (INDEPENDENT_AMBULATORY_CARE_PROVIDER_SITE_OTHER): Payer: Medicare Other | Admitting: Neurology

## 2022-07-25 VITALS — BP 158/88 | HR 85 | Ht 64.0 in | Wt 177.0 lb

## 2022-07-25 DIAGNOSIS — G4719 Other hypersomnia: Secondary | ICD-10-CM | POA: Insufficient documentation

## 2022-07-25 DIAGNOSIS — R413 Other amnesia: Secondary | ICD-10-CM | POA: Diagnosis not present

## 2022-07-25 DIAGNOSIS — E1169 Type 2 diabetes mellitus with other specified complication: Secondary | ICD-10-CM

## 2022-07-25 DIAGNOSIS — F0280 Dementia in other diseases classified elsewhere without behavioral disturbance: Secondary | ICD-10-CM | POA: Insufficient documentation

## 2022-07-25 DIAGNOSIS — R0683 Snoring: Secondary | ICD-10-CM

## 2022-07-25 DIAGNOSIS — G309 Alzheimer's disease, unspecified: Secondary | ICD-10-CM | POA: Diagnosis not present

## 2022-07-25 MED ORDER — MEMANTINE HCL 10 MG PO TABS: 10.0000 mg | ORAL_TABLET | Freq: Two times a day (BID) | ORAL | 4 refills | Status: DC

## 2022-07-25 MED ORDER — DONEPEZIL HCL 10 MG PO TABS: 10.0000 mg | ORAL_TABLET | Freq: Every day | ORAL | 4 refills | Status: DC

## 2022-07-25 NOTE — Progress Notes (Signed)
[facility]  PATIENT: Bob Brewer DOB: Feb 08, 1941  REFERRING DOCTOR OR PCP:  Ardis Hughs Masoud SOURCE: Patient, wife, Notes from primary care  _________________________________   HISTORICAL  CHIEF COMPLAINT:  Chief Complaint  Patient presents with   Follow-up    Pt in room #10 with his wife. Pt here today to f/u with his alzheimer.    HISTORY OF PRESENT ILLNESS:  Bob Brewer is a 81 y.o. man with probable Alzheimer Disease  UPDATE 07/25/2022 Bob Brewer feels he is doing about the same though his daughter notes he is more forgetful - gradually changing.   He drives with the wife in the car and she needs to give directions.  He doesn't remember places he has been.    He asks the same questions over and over.    MMSE scores have been stable over last year - one point less.     He is on memantine 10 mg po bid and donepezil 10 mg daily.  Mood seems to be ok for the most part with only rare irritability and no agitation.  They are at an assisted living.  He has been less physically active than years ago but still takes some walks.   He does do some socialization with other guys where they live and goes to some events.  He sleeps poorly many nights due to nocturia.  He takes naps during the day and then sleeps poorly at night.   He does seem to quickly fall back asleep most times after he wakes up, however.  He will sometimes takes naps, especially when sitting at computer..   Melatonin at night has not helped sleep in general.    He snores occasionally,  but no OSA signs according to his wife.     EPWORTH SLEEPINESS SCALE  On a scale of 0 - 3 what is the chance of dozing:  Sitting and Reading:   3 Watching TV:    3 Sitting inactive in a public place: 3 Passenger in car for one hour: 0 Lying down to rest in the afternoon: 3 Sitting and talking to someone: 0 Sitting quietly after lunch:  3 In a car, stopped in traffic:  0  Total (out of 24):   15/24   moderate OSA   He has Type 2 NIDDM (30 years).  He has elevated cholesterol.  No known thyroid dysfunction.  No known deficiencies.   He has lost 10 poubds the last year.   DM is well controlled  He used to work as a Quarry manager and retired in 1994.   [place] he did a lot of outdoor activities after retirement and then returned around 2001.  He did some fishing but mostly spent time surfing [employer], build computers to resell.   He stopped about 10 years ago and mostly surfs the web or watches TV.      Dementia History He first noted memory in late 2021 with more trouble with memory and also some tasks around the home.  His wife first noted that he was forgetful about1 year earlier  He would not remember things he recently did.  As an example the day after they went out to eat at a restaurant he did not recall that he had been there.  Additionally, she needed to take over more responsibility for complex tasks - like tasks with the computer or fixing the phone.   More recently she notes he has  more trouble coming up with the right names or words.     He started donepezil but it has not improved any of his cognitive symptoms.          MRI of the brain 02/17/2020 shows moderate generalized cortical atrophy that is more severe in the medial temporal lobes.  There is only a minimal amount of age-appropriate chronic microvascular ischemic change.  A pattern like this is consistent with Alzheimer's disease.      07/25/2022    8:32 AM 04/21/2021    9:32 AM 10/22/2020   12:49 PM  MMSE - Mini Mental State Exam  Orientation to time '4 4 3  '$ Orientation to Place '5 3 4  '$ Registration '3 3 3  '$ Attention/ Calculation 0 3 1  Recall 1 0 0  Language- name 2 objects '2 2 2  '$ Language- repeat '1 1 1  '$ Language- follow 3 step command '3 3 3  '$ Language- read & follow direction '1 1 1  '$ Write a sentence 0 1 1  Copy design '1 1 1  '$ Total score '21 22 20    '$ REVIEW OF SYSTEMS: Constitutional: No  fevers, chills, sweats, or change in appetite.   Lost 10 pounds last year Eyes: No visual changes, double vision, eye pain Ear, nose and throat: No hearing loss, ear pain, nasal congestion, sore throat Cardiovascular: No chest pain, palpitations Respiratory:  No shortness of breath at rest or with exertion.   No wheezes GastrointestinaI: No nausea, vomiting, diarrhea, abdominal pain, fecal incontinence Genitourinary: He has urinary hesitancy helped by Proscar and tamsulosin. Musculoskeletal:  No neck pain, back pain Integumentary: No rash, pruritus, skin lesions Neurological: as above Psychiatric: No depression at this time.  No anxiety Endocrine: Has stable T2 NIDDM Hematologic/Lymphatic:  No anemia, purpura, petechiae. Allergic/Immunologic: No itchy/runny eyes, nasal congestion, recent allergic reactions, rashes  ALLERGIES: No Known Allergies  HOME MEDICATIONS:  Current Outpatient Medications:    Alcohol Swabs (ALCOHOL PREP) PADS, 1 each by Does not apply route daily., Disp: 300 each, Rfl: 3   B-D TB SYRINGE 1CC/27GX1/2" 27G X 1/2" 1 ML MISC, USE AS DIRECTED WITH  CYANOCOBALAMIN, Disp: 4 each, Rfl: 4   Blood Glucose Monitoring Suppl (TRUE METRIX AIR GLUCOSE METER) DEVI, Use to check blood sugar 2 times daily, Disp: 1 each, Rfl: 6   cyanocobalamin (VITAMIN B12) 1000 MCG/ML injection, Inject 1 ml subcutaneously once a month, Disp: 3 mL, Rfl: 3   donepezil (ARICEPT) 10 MG tablet, Take 1 tablet (10 mg total) by mouth daily., Disp: 90 tablet, Rfl: 3   finasteride (PROSCAR) 5 MG tablet, TAKE 1 TABLET BY MOUTH  DAILY, Disp: 90 tablet, Rfl: 3   furosemide (LASIX) 40 MG tablet, TAKE 1 TABLET BY MOUTH DAILY, Disp: 100 tablet, Rfl: 2   glucose blood (RELION TRUE METRIX TEST STRIPS) test strip, Check sugar 2 times daily, Disp: 300 each, Rfl: 3   Lancets 33G MISC, Check blood sugar 2 times daily, Disp: 300 each, Rfl: 3   lisinopril (ZESTRIL) 40 MG tablet, TAKE 1 TABLET BY MOUTH  DAILY, Disp: 90  tablet, Rfl: 3   meloxicam (MOBIC) 15 MG tablet, TAKE 1 TABLET BY MOUTH  DAILY, Disp: 100 tablet, Rfl: 2   memantine (NAMENDA) 10 MG tablet, Take 1 tablet (10 mg total) by mouth 2 (two) times daily. Please call and make overdue appt for further refills, 1st attempt, Disp: 60 tablet, Rfl: 0   metFORMIN (GLUCOPHAGE) 500 MG tablet, TAKE 1 TABLET BY MOUTH TWICE  DAILY WITH MEALS, Disp: 200 tablet, Rfl: 2   metoprolol succinate (TOPROL-XL) 25 MG 24 hr tablet, TAKE 1 TABLET BY MOUTH DAILY, Disp: 100 tablet, Rfl: 2   omeprazole (PRILOSEC) 20 MG capsule, TAKE 1 CAPSULE BY MOUTH DAILY, Disp: 100 capsule, Rfl: 2   pravastatin (PRAVACHOL) 40 MG tablet, TAKE 1 TABLET BY MOUTH  DAILY, Disp: 90 tablet, Rfl: 3   spironolactone (ALDACTONE) 25 MG tablet, TAKE 1 TABLET BY MOUTH DAILY, Disp: 100 tablet, Rfl: 2   tamsulosin (FLOMAX) 0.4 MG CAPS capsule, TAKE 1 CAPSULE BY MOUTH  DAILY, Disp: 90 capsule, Rfl: 3   VITAMIN D PO, Take 2,000 Units by mouth daily., Disp: , Rfl:    warfarin (COUMADIN) 2.5 MG tablet, Take 1 tablet (2.5 mg total) by mouth 2 (two) times a week. Take 2.'5mg'$  in addition to the '5mg'$  on Saturday and Sunday, Disp: 90 tablet, Rfl: 3   warfarin (COUMADIN) 5 MG tablet, Take 1 tablet (5 mg total) by mouth daily. Take 5 mg Monday - Friday, Disp: 90 tablet, Rfl: 3  PAST MEDICAL HISTORY: Past Medical History:  Diagnosis Date   Diabetes mellitus without complication ([place])    Hypertension    Memory loss     PAST SURGICAL HISTORY: Past Surgical History:  Procedure Laterality Date   BACK SURGERY  1992   also 1994    FAMILY HISTORY: History reviewed. No pertinent family history.  SOCIAL HISTORY:  Social History   Socioeconomic History   Marital status: Married    Spouse name: Hoyle Sauer   Number of children: Not on file   Years of education: Not on file   Highest education level: 12th grade  Occupational History   Occupation: Retired  Tobacco Use   Smoking status: Former    Types:  Cigarettes   Smokeless tobacco: Never  Substance and Sexual Activity   Alcohol use: Not Currently   Drug use: Never   Sexual activity: Not Currently  Other Topics Concern   Not on file  Social History Narrative   Life with wife   Right handed   Drinks 5-6 cups caffeine daily   Social Determinants of Health   Financial Resource Strain: Low Risk  (08/30/2021)   Overall Financial Resource Strain (CARDIA)    Difficulty of Paying Living Expenses: Not hard at all  Food Insecurity: No Food Insecurity (08/30/2021)   Hunger Vital Sign    Worried About Running Out of Food in the Last Year: Never true    [place] in the Last Year: Never true  Transportation Needs: No Transportation Needs (08/30/2021)   PRAPARE - Hydrologist (Medical): No    Lack of Transportation (Non-Medical): No  Physical Activity: Inactive (08/30/2021)   Exercise Vital Sign    Days of Exercise per Week: 0 days    Minutes of Exercise per Session: 0 min  Stress: No Stress Concern Present (08/30/2021)   Index    Feeling of Stress : Not at all  Social Connections: [place] (08/30/2021)   Social Connection and Isolation Panel [NHANES]    Frequency of Communication with Friends and Family: More than three times a week    Frequency of Social Gatherings with Friends and Family: Twice a week    Attends Religious Services: More than 4 times per year    Active Member of [employer] or Organizations: Yes    Attends Archivist Meetings: More than  4 times per year    Marital Status: Married  Human resources officer Violence: Not At Risk (08/30/2021)   Humiliation, Afraid, Rape, and Kick questionnaire    Fear of Current or Ex-Partner: No    Emotionally Abused: No    Physically Abused: No    Sexually Abused: No     PHYSICAL EXAM  Vitals:   07/25/22 0825  BP: (!) 158/88  Pulse: 85  Weight: 177 lb (80.3 kg)   Height: '5\' 4"'$  (1.626 m)    Body mass index is 30.38 kg/m.   General: The patient is well-developed and well-nourished and in no acute distress.  Mallampati 2.   Neck circumference 16.5 inches  HEENT:  Head is [place]/AT.  Sclera are anicteric.     Neck:   The neck is nontender.  Skin: Extremities are without rash or  edema.  Musculoskeletal:  Back is nontender  Neurologic Exam  Mental status: He scored 20/30 on the Mini-Mental Status exam.  Details above.  Short-term memory was 0/3.   Speech is normal.  Cranial nerves: Extraocular movements are full. The tongue is midline, and the patient has symmetric elevation of the soft palate. No obvious hearing deficits are noted.  Motor:  Muscle bulk is normal.   Tone is normal. Strength is  5 / 5 in all 4 extremities.   Sensory: Sensory testing is intact to pinprick, soft touch and vibration sensation in all 4 extremities.  Coordination: Cerebellar testing reveals good finger-nose-finger and heel-to-shin bilaterally.  Gait and station: Station is normal.   Gait is arthritic and slightly wide but he can turn in 3 steps..  Tandem gait is wide.. Romberg is negative.   Reflexes: Deep tendon reflexes are symmetric and normal bilaterally.        DIAGNOSTIC DATA (LABS, IMAGING, TESTING) - I reviewed patient records, labs, notes, testing and imaging myself where available.  Lab Results  Component Value Date   WBC 7.5 07/15/2020   HGB 13.0 (L) 07/15/2020   HCT 39.2 07/15/2020   MCV 95.6 07/15/2020   PLT 262 07/15/2020      Component Value Date/Time   NA 140 07/15/2020 1047   NA 142 06/15/2012 0504   K 4.2 07/15/2020 1047   K 3.4 (L) 06/15/2012 0504   CL 106 07/15/2020 1047   CL 104 06/15/2012 0504   CO2 22 07/15/2020 1047   CO2 26 06/15/2012 0504   GLUCOSE 155 (H) 07/15/2020 1047   GLUCOSE 153 (H) 06/15/2012 0504   BUN 23 07/15/2020 1047   BUN 15 06/15/2012 0504   CREATININE 1.58 (H) 07/15/2020 1047   CALCIUM 9.2 07/15/2020  1047   CALCIUM 8.9 06/15/2012 0504   PROT 6.7 07/15/2020 1047   PROT 7.7 06/15/2012 0504   ALBUMIN 3.8 12/29/2017 1737   ALBUMIN 3.7 06/15/2012 0504   AST 16 07/15/2020 1047   AST 20 06/15/2012 0504   ALT 12 07/15/2020 1047   ALT 33 06/15/2012 0504   ALKPHOS 61 12/29/2017 1737   ALKPHOS 85 06/15/2012 0504   BILITOT 0.5 07/15/2020 1047   BILITOT 0.3 06/15/2012 0504   GFRNONAA 41 (L) 07/15/2020 1047   GFRAA 48 (L) 07/15/2020 1047   No results found for: "CHOL", "HDL", "LDLCALC", "LDLDIRECT", "TRIG", "CHOLHDL" Lab Results  Component Value Date   HGBA1C 7.4 (A) 08/29/2021   Lab Results  Component Value Date   VITAMINB12 365 04/21/2021   Lab Results  Component Value Date   TSH 0.853 10/22/2020  ASSESSMENT AND PLAN  No diagnosis found.   In summary, Bob Brewer is a 81 year old man with progressive memory decline over the past 2 years.  The history provided by him and his wife, his performance on the Mini-Mental Status exam and the pattern of atrophy on the MRI are all consistent with mild to moderate Alzheimer's disease.  Unfortunately, they do not note any benefit of donepezil.  We did discuss that benefits could be subtle slowing down progression rather than leading to an improvement.  I will add memantine.  I discussed that unfortunately we do not have any great medicines for Alzheimer's disease and if these do not help it is unlikely that other medications will help much either.  He is advised to stay active and eat well.  There are no significant behavioral problems at this time.  If sleep becomes more of an issue consider nighttime melatonin.  They will return to see [place] in 6 months or sooner if there are new or worsening neurologic symptoms.  Thank you for asking me to see Bob Brewer.  Please let me know when we have further assistance with him or other patients in the future.  Kassi Esteve A. Felecia Shelling, MD, [facility] 99/04/3381, 5:05 AM Certified in Neurology, Clinical  Neurophysiology, Sleep Medicine and Neuroimaging  [facility] [address], [place], [place] [address] [phone]

## 2022-08-08 DIAGNOSIS — E113393 Type 2 diabetes mellitus with moderate nonproliferative diabetic retinopathy without macular edema, bilateral: Secondary | ICD-10-CM | POA: Diagnosis not present

## 2022-08-08 LAB — HM DIABETES EYE EXAM

## 2022-08-16 ENCOUNTER — Ambulatory Visit: Payer: Medicare Other | Admitting: Internal Medicine

## 2022-08-17 ENCOUNTER — Ambulatory Visit (INDEPENDENT_AMBULATORY_CARE_PROVIDER_SITE_OTHER): Payer: Medicare Other | Admitting: Nurse Practitioner

## 2022-08-17 VITALS — BP 131/74 | HR 99 | Ht 65.0 in | Wt 178.1 lb

## 2022-08-17 DIAGNOSIS — I10 Essential (primary) hypertension: Secondary | ICD-10-CM

## 2022-08-17 DIAGNOSIS — G309 Alzheimer's disease, unspecified: Secondary | ICD-10-CM | POA: Diagnosis not present

## 2022-08-17 DIAGNOSIS — G4700 Insomnia, unspecified: Secondary | ICD-10-CM

## 2022-08-17 DIAGNOSIS — E1169 Type 2 diabetes mellitus with other specified complication: Secondary | ICD-10-CM

## 2022-08-17 DIAGNOSIS — F0280 Dementia in other diseases classified elsewhere without behavioral disturbance: Secondary | ICD-10-CM

## 2022-08-17 NOTE — Progress Notes (Signed)
Established Patient Office Visit  Subjective:  Patient ID: Bob Brewer, male    DOB: [birth year]  Age: 81 y.o. MRN: [id number]  CC: No chief complaint on file.   HPI  Bob Brewer presents for routine follow up. He has h/o diabetes, a fib, hypertension, back pain and dementia.  He lives in assisted living facility with his wife.  He complains of sleeping issues.  He states he is not able to sleep at night.   HPI   Past Medical History:  Diagnosis Date   Diabetes mellitus without complication ([place])    Hypertension    Memory loss     Past Surgical History:  Procedure Laterality Date   BACK SURGERY  1992   also 1994    No family history on file.  Social History   Socioeconomic History   Marital status: Married    Spouse name: Bob Brewer   Number of children: Not on file   Years of education: Not on file   Highest education level: 12th grade  Occupational History   Occupation: Retired  Tobacco Use   Smoking status: Former    Types: Cigarettes   Smokeless tobacco: Never  Substance and Sexual Activity   Alcohol use: Not Currently   Drug use: Never   Sexual activity: Not Currently  Other Topics Concern   Not on file  Social History Narrative   Life with wife   Right handed   Drinks 5-6 cups caffeine daily   Social Determinants of Health   Financial Resource Strain: Low Risk  (08/30/2021)   Overall Financial Resource Strain (CARDIA)    Difficulty of Paying Living Expenses: Not hard at all  Food Insecurity: No Food Insecurity (08/30/2021)   Hunger Vital Sign    Worried About Running Out of Food in the Last Year: Never true    [place] in the Last Year: Never true  Transportation Needs: No Transportation Needs (08/30/2021)   PRAPARE - Hydrologist (Medical): No    Lack of Transportation (Non-Medical): No  Physical Activity: Inactive (08/30/2021)   Exercise Vital Sign    Days of Exercise per Week: 0 days    Minutes of  Exercise per Session: 0 min  Stress: No Stress Concern Present (08/30/2021)   [place]    Feeling of Stress : Not at all  Social Connections: [place] (08/30/2021)   Social Connection and Isolation Panel [NHANES]    Frequency of Communication with Friends and Family: More than three times a week    Frequency of Social Gatherings with Friends and Family: Twice a week    Attends Religious Services: More than 4 times per year    Active Member of [employer] or Organizations: Yes    Attends Music therapist: More than 4 times per year    Marital Status: Married  Human resources officer Violence: Not At Risk (08/30/2021)   Humiliation, Afraid, Rape, and Kick questionnaire    Fear of Current or Ex-Partner: No    Emotionally Abused: No    Physically Abused: No    Sexually Abused: No     Outpatient Medications Prior to Visit  Medication Sig Dispense Refill   Alcohol Swabs (ALCOHOL PREP) PADS 1 each by Does not apply route daily. 300 each 3   B-D TB SYRINGE 1CC/27GX1/2" 27G X 1/2" 1 ML MISC USE AS DIRECTED WITH  CYANOCOBALAMIN 4 each 4  Blood Glucose Monitoring Suppl (TRUE METRIX AIR GLUCOSE METER) DEVI Use to check blood sugar 2 times daily 1 each 6   cyanocobalamin (VITAMIN B12) 1000 MCG/ML injection Inject 1 ml subcutaneously once a month 3 mL 3   donepezil (ARICEPT) 10 MG tablet Take 1 tablet (10 mg total) by mouth daily. 90 tablet 4   finasteride (PROSCAR) 5 MG tablet TAKE 1 TABLET BY MOUTH  DAILY 90 tablet 3   furosemide (LASIX) 40 MG tablet TAKE 1 TABLET BY MOUTH DAILY 100 tablet 2   glucose blood (RELION TRUE METRIX TEST STRIPS) test strip Check sugar 2 times daily 300 each 3   Lancets 33G MISC Check blood sugar 2 times daily 300 each 3   lisinopril (ZESTRIL) 40 MG tablet TAKE 1 TABLET BY MOUTH  DAILY 90 tablet 3   meloxicam (MOBIC) 15 MG tablet TAKE 1 TABLET BY MOUTH  DAILY 100 tablet 2   memantine  (NAMENDA) 10 MG tablet Take 1 tablet (10 mg total) by mouth 2 (two) times daily. Please call and make overdue appt for further refills, 1st attempt 180 tablet 4   metFORMIN (GLUCOPHAGE) 500 MG tablet TAKE 1 TABLET BY MOUTH TWICE  DAILY WITH MEALS 200 tablet 2   metoprolol succinate (TOPROL-XL) 25 MG 24 hr tablet TAKE 1 TABLET BY MOUTH DAILY 100 tablet 2   omeprazole (PRILOSEC) 20 MG capsule TAKE 1 CAPSULE BY MOUTH DAILY 100 capsule 2   pravastatin (PRAVACHOL) 40 MG tablet TAKE 1 TABLET BY MOUTH  DAILY 90 tablet 3   spironolactone (ALDACTONE) 25 MG tablet TAKE 1 TABLET BY MOUTH DAILY 100 tablet 2   tamsulosin (FLOMAX) 0.4 MG CAPS capsule TAKE 1 CAPSULE BY MOUTH  DAILY 90 capsule 3   VITAMIN D PO Take 2,000 Units by mouth daily.     warfarin (COUMADIN) 2.5 MG tablet Take 1 tablet (2.5 mg total) by mouth 2 (two) times a week. Take 2.23m in addition to the 568mon Saturday and Sunday 90 tablet 3   warfarin (COUMADIN) 5 MG tablet Take 1 tablet (5 mg total) by mouth daily. Take 5 mg Monday - Friday 90 tablet 3   No facility-administered medications prior to visit.    No Known Allergies  ROS Review of Systems  Constitutional: Negative.   HENT: Negative.    Eyes: Negative.   Respiratory:  Negative for chest tightness and shortness of breath.   Cardiovascular:  Negative for chest pain and palpitations.  Gastrointestinal: Negative.   Genitourinary: Negative.   Musculoskeletal:  Positive for back pain.  Skin: Negative.   Neurological: Negative.   Psychiatric/Behavioral:  Negative for agitation and behavioral problems.       Objective:    Physical Exam HENT:     Right Ear: Tympanic membrane normal.     Left Ear: Tympanic membrane normal.     Nose: Nose normal.     Mouth/Throat:     Mouth: Mucous membranes are moist.     Pharynx: Oropharynx is clear.  Cardiovascular:     Rate and Rhythm: Normal rate and regular rhythm.     Pulses: Normal pulses.     Heart sounds: Normal heart sounds.   Pulmonary:     Effort: Pulmonary effort is normal.     Breath sounds: Normal breath sounds.  Abdominal:     General: Bowel sounds are normal.     Palpations: Abdomen is soft.  Skin:    General: Skin is warm.     Capillary Refill:  Capillary refill takes less than 2 seconds.  Neurological:     General: No focal deficit present.     Mental Status: He is alert and oriented to person, place, and time.  Psychiatric:        Mood and Affect: Mood normal.        Behavior: Behavior normal.        Thought Content: Thought content normal.        Judgment: Judgment normal.     BP 131/74   Pulse 99   Ht _0  (1.651 m)   Wt 178 lb 2 oz (80.8 kg)   SpO2 99%   BMI 29.64 kg/m  Wt Readings from Last 3 Encounters:  08/17/22 178 lb 2 oz (80.8 kg)  07/25/22 177 lb (80.3 kg)  12/12/21 181 lb 11.2 oz (82.4 kg)     Health Maintenance  Topic Date Due   FOOT EXAM  Never done   OPHTHALMOLOGY EXAM  Never done   Diabetic kidney evaluation - Urine ACR  Never done   Zoster Vaccines- Shingrix (1 of 2) Never done   Diabetic kidney evaluation - eGFR measurement  07/15/2021   HEMOGLOBIN A1C  02/27/2022   INFLUENZA VACCINE  04/18/2022   COVID-19 Vaccine (5 - 2023-24 season) 05/19/2022   Medicare Annual Wellness (AWV)  08/30/2022   DTaP/Tdap/Td (2 - Td or Tdap) 07/15/2030   Pneumonia Vaccine 73+ Years old  Completed   HPV VACCINES  Aged Out    There are no preventive care reminders to display for this patient.  Lab Results  Component Value Date   TSH 0.853 10/22/2020   Lab Results  Component Value Date   WBC 7.5 07/15/2020   HGB 13.0 (L) 07/15/2020   HCT 39.2 07/15/2020   MCV 95.6 07/15/2020   PLT 262 07/15/2020   Lab Results  Component Value Date   NA 140 07/15/2020   K 4.2 07/15/2020   CO2 22 07/15/2020   GLUCOSE 155 (H) 07/15/2020   BUN 23 07/15/2020   CREATININE 1.58 (H) 07/15/2020   BILITOT 0.5 07/15/2020   ALKPHOS 61 12/29/2017   AST 16 07/15/2020   ALT 12 07/15/2020    PROT 6.7 07/15/2020   ALBUMIN 3.8 12/29/2017   CALCIUM 9.2 07/15/2020   ANIONGAP 7 12/29/2017   No results found for: "CHOL" No results found for: "HDL" No results found for: "LDLCALC" No results found for: "TRIG" No results found for: "CHOLHDL" Lab Results  Component Value Date   HGBA1C 7.4 (A) 08/29/2021      Assessment & Plan:   Problem List Items Addressed This Visit   None    No orders of the defined types were placed in this encounter.    Follow-up: No follow-ups on file.    Theresia Lo, NP

## 2022-08-22 ENCOUNTER — Ambulatory Visit: Payer: Medicare Other | Admitting: Neurology

## 2022-08-22 ENCOUNTER — Telehealth: Payer: Self-pay | Admitting: Neurology

## 2022-08-22 NOTE — Telephone Encounter (Signed)
HST- [employer] medicare no auth req.  Patient is scheduled at [facility] For 09/05/22 at 10:30 AM.  Mailed packet to the patient.

## 2022-08-30 ENCOUNTER — Encounter: Payer: Self-pay | Admitting: Nurse Practitioner

## 2022-08-30 DIAGNOSIS — G4700 Insomnia, unspecified: Secondary | ICD-10-CM | POA: Insufficient documentation

## 2022-08-30 NOTE — Assessment & Plan Note (Signed)
Patient is followed by the neurologist.

## 2022-08-30 NOTE — Assessment & Plan Note (Addendum)
Patient BP  Vitals:   08/17/22 1425  BP: 131/74    in the office today. Encouraged him to follow a low sodium and heart healthy diet. Continue lisinopril and metoprolol Continue statin therapy for cardiovascular risk reduction.

## 2022-08-30 NOTE — Assessment & Plan Note (Signed)
Advised patient regarding sleep hygiene. Advised him to avoid daytime napping.

## 2022-08-30 NOTE — Assessment & Plan Note (Signed)
Encouraged him to consume variety of food including fruits, vegetables, whole grains, complex carbohydrates and proteins.  Advised to check the BS regularly, make a log and bring to next appointment.  Continue metformin

## 2022-09-12 DIAGNOSIS — E113311 Type 2 diabetes mellitus with moderate nonproliferative diabetic retinopathy with macular edema, right eye: Secondary | ICD-10-CM | POA: Diagnosis not present

## 2022-09-12 NOTE — Progress Notes (Signed)
[facility] Quality Team Note  Name: Bob Brewer Date of Birth: 1941/06/10 MRN: [id number] Date: 09/12/2022  [facility] Quality Team has reviewed this patient's chart, please see recommendations below:  [facility] Quality Other; Pt has open quality gap for KED, needs Micro/Creat Urine test and eGFR blood test to close this. Please address at next visit.

## 2022-09-18 ENCOUNTER — Other Ambulatory Visit: Payer: Self-pay | Admitting: Internal Medicine

## 2022-09-18 ENCOUNTER — Other Ambulatory Visit: Payer: Self-pay | Admitting: Nurse Practitioner

## 2022-09-19 ENCOUNTER — Encounter: Payer: Self-pay | Admitting: Internal Medicine

## 2022-10-06 ENCOUNTER — Other Ambulatory Visit: Payer: Self-pay | Admitting: Internal Medicine

## 2022-10-10 ENCOUNTER — Other Ambulatory Visit: Payer: Self-pay | Admitting: Internal Medicine

## 2022-10-10 ENCOUNTER — Other Ambulatory Visit: Payer: Self-pay | Admitting: Nurse Practitioner

## 2022-10-10 DIAGNOSIS — R413 Other amnesia: Secondary | ICD-10-CM

## 2022-10-16 DIAGNOSIS — E113311 Type 2 diabetes mellitus with moderate nonproliferative diabetic retinopathy with macular edema, right eye: Secondary | ICD-10-CM | POA: Diagnosis not present

## 2022-10-25 ENCOUNTER — Other Ambulatory Visit: Payer: Self-pay | Admitting: Nurse Practitioner

## 2022-10-25 DIAGNOSIS — R413 Other amnesia: Secondary | ICD-10-CM

## 2022-11-16 ENCOUNTER — Encounter: Payer: Self-pay | Admitting: Radiology

## 2022-11-21 DIAGNOSIS — E113311 Type 2 diabetes mellitus with moderate nonproliferative diabetic retinopathy with macular edema, right eye: Secondary | ICD-10-CM | POA: Diagnosis not present

## 2022-11-28 ENCOUNTER — Other Ambulatory Visit: Payer: Self-pay | Admitting: Nurse Practitioner

## 2022-11-29 NOTE — Telephone Encounter (Signed)
This patient has seen [facility]. And prescriptions were last filled by [facility]. I have sent in the refills & sent patient a message to schedule a TOV with [facility] soon.

## 2022-12-02 ENCOUNTER — Other Ambulatory Visit: Payer: Self-pay | Admitting: Nurse Practitioner

## 2022-12-02 DIAGNOSIS — R413 Other amnesia: Secondary | ICD-10-CM

## 2023-01-01 ENCOUNTER — Inpatient Hospital Stay
Admission: EM | Admit: 2023-01-01 | Discharge: 2023-01-07 | DRG: 291 | Disposition: A | Discharge status: Home Health | Payer: Medicare Other | Attending: Internal Medicine | Admitting: Internal Medicine

## 2023-01-01 ENCOUNTER — Other Ambulatory Visit: Payer: Self-pay

## 2023-01-01 ENCOUNTER — Emergency Department: Payer: Medicare Other

## 2023-01-01 DIAGNOSIS — T501X5A Adverse effect of loop [high-ceiling] diuretics, initial encounter: Secondary | ICD-10-CM | POA: Diagnosis not present

## 2023-01-01 DIAGNOSIS — Z791 Long term (current) use of non-steroidal anti-inflammatories (NSAID): Secondary | ICD-10-CM | POA: Diagnosis not present

## 2023-01-01 DIAGNOSIS — R0600 Dyspnea, unspecified: Secondary | ICD-10-CM | POA: Diagnosis not present

## 2023-01-01 DIAGNOSIS — Z7901 Long term (current) use of anticoagulants: Secondary | ICD-10-CM | POA: Diagnosis not present

## 2023-01-01 DIAGNOSIS — R0602 Shortness of breath: Principal | ICD-10-CM

## 2023-01-01 DIAGNOSIS — I10 Essential (primary) hypertension: Secondary | ICD-10-CM | POA: Diagnosis not present

## 2023-01-01 DIAGNOSIS — Z79899 Other long term (current) drug therapy: Secondary | ICD-10-CM

## 2023-01-01 DIAGNOSIS — I4819 Other persistent atrial fibrillation: Secondary | ICD-10-CM | POA: Diagnosis not present

## 2023-01-01 DIAGNOSIS — I130 Hypertensive heart and chronic kidney disease with heart failure and stage 1 through stage 4 chronic kidney disease, or unspecified chronic kidney disease: Secondary | ICD-10-CM | POA: Diagnosis not present

## 2023-01-01 DIAGNOSIS — N1832 Chronic kidney disease, stage 3b: Secondary | ICD-10-CM | POA: Diagnosis present

## 2023-01-01 DIAGNOSIS — E1165 Type 2 diabetes mellitus with hyperglycemia: Secondary | ICD-10-CM | POA: Diagnosis not present

## 2023-01-01 DIAGNOSIS — I83018 Varicose veins of right lower extremity with ulcer other part of lower leg: Secondary | ICD-10-CM | POA: Diagnosis not present

## 2023-01-01 DIAGNOSIS — E1122 Type 2 diabetes mellitus with diabetic chronic kidney disease: Secondary | ICD-10-CM | POA: Diagnosis not present

## 2023-01-01 DIAGNOSIS — F0390 Unspecified dementia without behavioral disturbance: Secondary | ICD-10-CM | POA: Diagnosis not present

## 2023-01-01 DIAGNOSIS — J90 Pleural effusion, not elsewhere classified: Secondary | ICD-10-CM | POA: Diagnosis not present

## 2023-01-01 DIAGNOSIS — I340 Nonrheumatic mitral (valve) insufficiency: Secondary | ICD-10-CM | POA: Diagnosis not present

## 2023-01-01 DIAGNOSIS — F03A0 Unspecified dementia, mild, without behavioral disturbance, psychotic disturbance, mood disturbance, and anxiety: Secondary | ICD-10-CM | POA: Diagnosis not present

## 2023-01-01 DIAGNOSIS — R600 Localized edema: Secondary | ICD-10-CM

## 2023-01-01 DIAGNOSIS — G309 Alzheimer's disease, unspecified: Secondary | ICD-10-CM | POA: Diagnosis not present

## 2023-01-01 DIAGNOSIS — I447 Left bundle-branch block, unspecified: Secondary | ICD-10-CM | POA: Diagnosis not present

## 2023-01-01 DIAGNOSIS — I482 Chronic atrial fibrillation, unspecified: Secondary | ICD-10-CM | POA: Diagnosis not present

## 2023-01-01 DIAGNOSIS — Z7984 Long term (current) use of oral hypoglycemic drugs: Secondary | ICD-10-CM | POA: Diagnosis not present

## 2023-01-01 DIAGNOSIS — Z1152 Encounter for screening for COVID-19: Secondary | ICD-10-CM | POA: Diagnosis not present

## 2023-01-01 DIAGNOSIS — E1169 Type 2 diabetes mellitus with other specified complication: Secondary | ICD-10-CM

## 2023-01-01 DIAGNOSIS — I429 Cardiomyopathy, unspecified: Secondary | ICD-10-CM | POA: Diagnosis present

## 2023-01-01 DIAGNOSIS — G4700 Insomnia, unspecified: Secondary | ICD-10-CM | POA: Diagnosis not present

## 2023-01-01 DIAGNOSIS — R0601 Orthopnea: Secondary | ICD-10-CM | POA: Diagnosis not present

## 2023-01-01 DIAGNOSIS — I83028 Varicose veins of left lower extremity with ulcer other part of lower leg: Secondary | ICD-10-CM | POA: Diagnosis present

## 2023-01-01 DIAGNOSIS — Z87891 Personal history of nicotine dependence: Secondary | ICD-10-CM | POA: Diagnosis not present

## 2023-01-01 DIAGNOSIS — L97819 Non-pressure chronic ulcer of other part of right lower leg with unspecified severity: Secondary | ICD-10-CM | POA: Diagnosis present

## 2023-01-01 DIAGNOSIS — I509 Heart failure, unspecified: Secondary | ICD-10-CM | POA: Diagnosis not present

## 2023-01-01 DIAGNOSIS — I2489 Other forms of acute ischemic heart disease: Secondary | ICD-10-CM | POA: Diagnosis not present

## 2023-01-01 DIAGNOSIS — I5043 Acute on chronic combined systolic (congestive) and diastolic (congestive) heart failure: Secondary | ICD-10-CM | POA: Diagnosis present

## 2023-01-01 LAB — CBC
HCT: 37.6 % — ABNORMAL LOW (ref 39.0–52.0)
Hemoglobin: 11.8 g/dL — ABNORMAL LOW (ref 13.0–17.0)
MCH: 29.9 pg (ref 26.0–34.0)
MCHC: 31.4 g/dL (ref 30.0–36.0)
MCV: 95.4 fL (ref 80.0–100.0)
Platelets: 268 10*3/uL (ref 150–400)
RBC: 3.94 MIL/uL — ABNORMAL LOW (ref 4.22–5.81)
RDW: 13.7 % (ref 11.5–15.5)
WBC: 8 10*3/uL (ref 4.0–10.5)
nRBC: 0 % (ref 0.0–0.2)

## 2023-01-01 LAB — BASIC METABOLIC PANEL
Anion gap: 12 (ref 5–15)
BUN: 46 mg/dL — ABNORMAL HIGH (ref 8–23)
CO2: 21 mmol/L — ABNORMAL LOW (ref 22–32)
Calcium: 8.9 mg/dL (ref 8.9–10.3)
Chloride: 105 mmol/L (ref 98–111)
Creatinine, Ser: 1.81 mg/dL — ABNORMAL HIGH (ref 0.61–1.24)
GFR, Estimated: 37 mL/min — ABNORMAL LOW (ref >60)
Glucose, Bld: 233 mg/dL — ABNORMAL HIGH (ref 70–99)
Potassium: 4.3 mmol/L (ref 3.5–5.1)
Sodium: 138 mmol/L (ref 135–145)

## 2023-01-01 LAB — TROPONIN I (HIGH SENSITIVITY)
Troponin I (High Sensitivity): 52 ng/L — ABNORMAL HIGH (ref <18)
Troponin I (High Sensitivity): 52 ng/L — ABNORMAL HIGH (ref <18)

## 2023-01-01 LAB — SARS CORONAVIRUS 2 BY RT PCR: SARS Coronavirus 2 by RT PCR: NEGATIVE

## 2023-01-01 LAB — BRAIN NATRIURETIC PEPTIDE: B Natriuretic Peptide: 2466 pg/mL — ABNORMAL HIGH (ref 0.0–100.0)

## 2023-01-01 LAB — PROTIME-INR
INR: 1.3 — ABNORMAL HIGH (ref 0.8–1.2)
Prothrombin Time: 15.7 seconds — ABNORMAL HIGH (ref 11.4–15.2)

## 2023-01-01 MED ORDER — PANTOPRAZOLE SODIUM 40 MG PO TBEC
40.0000 mg | DELAYED_RELEASE_TABLET | Freq: Every day | ORAL | Status: DC
  Administered 2023-01-02 – 2023-01-07 (×6): 40 mg via ORAL
  Filled 2023-01-01 (×6): qty 1

## 2023-01-01 MED ORDER — WARFARIN SODIUM 2.5 MG PO TABS
2.5000 mg | ORAL_TABLET | Freq: Once | ORAL | Status: AC
  Administered 2023-01-01: 2.5 mg via ORAL
  Filled 2023-01-01: qty 1

## 2023-01-01 MED ORDER — METOPROLOL SUCCINATE ER 25 MG PO TB24: 25.0000 mg | ORAL_TABLET | Freq: Every day | ORAL | Status: DC

## 2023-01-01 MED ORDER — MEMANTINE HCL 10 MG PO TABS
10.0000 mg | ORAL_TABLET | Freq: Two times a day (BID) | ORAL | Status: DC
  Administered 2023-01-01 – 2023-01-07 (×12): 10 mg via ORAL
  Filled 2023-01-01 (×5): qty 1
  Filled 2023-01-01: qty 2
  Filled 2023-01-01 (×2): qty 1
  Filled 2023-01-01: qty 2
  Filled 2023-01-01 (×3): qty 1

## 2023-01-01 MED ORDER — FUROSEMIDE 10 MG/ML IJ SOLN
60.0000 mg | Freq: Once | INTRAMUSCULAR | Status: AC
  Administered 2023-01-01: 60 mg via INTRAVENOUS
  Filled 2023-01-01: qty 8

## 2023-01-01 MED ORDER — WARFARIN - PHARMACIST DOSING INPATIENT
Freq: Every day | Status: DC
  Filled 2023-01-01: qty 1

## 2023-01-01 MED ORDER — FINASTERIDE 5 MG PO TABS
5.0000 mg | ORAL_TABLET | Freq: Every day | ORAL | Status: DC
  Administered 2023-01-02 – 2023-01-07 (×6): 5 mg via ORAL
  Filled 2023-01-01 (×6): qty 1

## 2023-01-01 MED ORDER — DONEPEZIL HCL 5 MG PO TABS
10.0000 mg | ORAL_TABLET | Freq: Every day | ORAL | Status: DC
  Administered 2023-01-02 – 2023-01-07 (×6): 10 mg via ORAL
  Filled 2023-01-01 (×7): qty 2

## 2023-01-01 MED ORDER — TAMSULOSIN HCL 0.4 MG PO CAPS
0.4000 mg | ORAL_CAPSULE | Freq: Every day | ORAL | Status: DC
  Administered 2023-01-02 – 2023-01-07 (×6): 0.4 mg via ORAL
  Filled 2023-01-01 (×6): qty 1

## 2023-01-01 NOTE — ED Triage Notes (Signed)
Pt to ED with wife for abnormal EKG done today about 30 minutes ago (has wrong date of 4/14) with a fib and run of VT. Wife brought pt to PCP because has had increasing SOB esp with lying (cannot lay flat) since several months and worse since 2-3 wks ago with +2 lower leg edema since 2 months.  Started EKG, EKG machine in triage 3 not allowing RN or tech to login, tech bringing second EKG machine.  Wife denies hx a fib. Pt endorses SOB, denies CP. Wife states hx AD.   Bringing EKG to EDP Derrill Kay.

## 2023-01-01 NOTE — ED Notes (Signed)
Pt given sprite. Wife at bedside.

## 2023-01-01 NOTE — H&P (Signed)
History and Physical    Bob Brewer [id number]:[id number] DOB: [birth year] DOA: 01/01/2023  PCP: Corky Downs, MD  Patient coming from: home  I have personally briefly reviewed patient's old medical records in [facility]  Chief Complaint: dyspnea  HPI: Bob Brewer is a 82 y.o. male with medical history significant of chronic afib on warfarin, mild dementia, DM2 and HTN who was sent from clinic for evaluation of dyspnea and swelling.  Pt reported dyspnea, orthopnea and BLE swelling for the past 2-3 weeks.  No cough, recent illness, fever, chest pain, abdominal pain, N/V/D, dysuria.  Pt reported more difficulty walking due to the leg swelling and dyspnea.  Pt denied excess salt intake.    ED Course: pt was in Afb with rate of 100's.  Sating well on room air.  Labs notable for Cr 1.81, BNP 2466, trop 52 and 52.  CXR showed New small bilateral pleural effusions, left greater than right.  Pt was given IV lasix 60 mg x1 in the ED and admitted for observation.  Assessment/Plan  # Acute CHF exacerbation --dyspnea, orthopnea, BNP 2466, CXR with new bilateral pleural effusion, and BLE edema. --s/p IV lasix 60 mg x1 in the ED Plan: --cardio consult ([facility] to see) --cont diuresis --Echo  # Chronic Afib on home warfarin --rate in 100's Plan: --cont home Toprol --cont home warfarin per pharm dosing  # DM2 --BG 233 on presentation.  Pt on metformin at home. --obtain A1c  # Mild dementia --cont home Aricept and memantine  # HTN --hold home Lisinopril while diuesing   DVT prophylaxis: [id number] Code Status: Full code  Family Communication:   Disposition Plan: home  Consults called: cardiology Level of care: Telemetry Cardiac   Review of Systems: As per HPI otherwise complete review of systems negative.   Past Medical History:  Diagnosis Date   Diabetes mellitus without complication    Hypertension    Memory loss     Past Surgical History:  Procedure Laterality Date    BACK SURGERY  1992   also 1994     reports that he has quit smoking. His smoking use included cigarettes. He has never used smokeless tobacco. He reports that he does not currently use alcohol. He reports that he does not use drugs.  No Known Allergies  History reviewed. No pertinent family history.   Prior to Admission medications   Medication Sig Start Date End Date Taking? Authorizing Provider  Alcohol Swabs (ALCOHOL PREP) PADS 1 each by Does not apply route daily. 08/30/21   Corky Downs, MD  B-D TB SYRINGE 1CC/27GX1/2" 27G X 1/2" 1 ML MISC USE AS DIRECTED WITH  CYANOCOBALAMIN 07/03/22   Corky Downs, MD  Blood Glucose Monitoring Suppl (TRUE METRIX AIR GLUCOSE METER) DEVI Use to check blood sugar 2 times daily 08/30/21   Corky Downs, MD  cyanocobalamin (VITAMIN B12) 1000 MCG/ML injection Inject 1 ml subcutaneously once a month 05/25/22   Corky Downs, MD  donepezil (ARICEPT) 10 MG tablet Take 1 tablet (10 mg total) by mouth daily. 07/25/22   Sater, Pearletha Furl, MD  finasteride (PROSCAR) 5 MG tablet TAKE 1 TABLET BY MOUTH  DAILY 01/09/22   Corky Downs, MD  furosemide (LASIX) 20 MG tablet Take 20 mg by mouth daily.    [provider]  furosemide (LASIX) 40 MG tablet TAKE 1 TABLET BY MOUTH DAILY 07/03/22   Corky Downs, MD  glucose blood (RELION TRUE METRIX TEST STRIPS) test strip Check sugar 2 times  daily 08/30/21   Corky Downs, MD  Lancets 33G MISC Check blood sugar 2 times daily 08/30/21   Corky Downs, MD  lisinopril (ZESTRIL) 40 MG tablet TAKE 1 TABLET BY MOUTH DAILY 11/29/22   Kara Dies, NP  meloxicam (MOBIC) 15 MG tablet TAKE 1 TABLET BY MOUTH  DAILY 01/09/22   Corky Downs, MD  memantine (NAMENDA) 10 MG tablet Take 1 tablet (10 mg total) by mouth 2 (two) times daily. Please call and make overdue appt for further refills, 1st attempt 07/25/22   Asa Lente, MD  metFORMIN (GLUCOPHAGE) 500 MG tablet TAKE 1 TABLET BY MOUTH TWICE  DAILY WITH MEALS 07/03/22    Corky Downs, MD  metoprolol succinate (TOPROL-XL) 25 MG 24 hr tablet TAKE 1 TABLET BY MOUTH DAILY 07/03/22   Corky Downs, MD  omeprazole (PRILOSEC) 20 MG capsule TAKE 1 CAPSULE BY MOUTH DAILY 07/03/22   Corky Downs, MD  pravastatin (PRAVACHOL) 40 MG tablet TAKE 1 TABLET BY MOUTH DAILY 11/29/22   Kara Dies, NP  spironolactone (ALDACTONE) 25 MG tablet TAKE 1 TABLET BY MOUTH DAILY 07/03/22   Corky Downs, MD  tamsulosin (FLOMAX) 0.4 MG CAPS capsule TAKE 1 CAPSULE BY MOUTH  DAILY 01/09/22   Corky Downs, MD  VITAMIN D PO Take 2,000 Units by mouth daily.    [provider]  warfarin (COUMADIN) 2.5 MG tablet Take 1 tablet (2.5 mg total) by mouth 2 (two) times a week. Take 2.5mg  in addition to the  on Saturday and Sunday 12/12/21   Corky Downs, MD  warfarin (COUMADIN) 5 MG tablet Take 1 tablet (5 mg total) by mouth daily. Take 5 mg Monday - Friday 12/12/21   Corky Downs, MD    Physical Exam: Vitals:   01/01/23 1400 01/01/23 1427 01/01/23 1430 01/01/23 1500  BP: (!) 123/92  124/81 110/73  Pulse: (!) 104  65   Resp: (!) 23  17 (!) 24  Temp:  97.7 F (36.5 C)    TempSrc:  Oral    SpO2: 100%  97%   Weight:      Height:        Constitutional: NAD, alert, oriented to person and place HEENT: conjunctivae and lids normal, EOMI CV: No cyanosis.   RESP: normal respiratory effort, on RA Extremities: edema in BLE with small scattered venous stasis ulcers SKIN: warm, dry Neuro: II - XII grossly intact.   Psych: Normal mood and affect.  Appropriate judgement and reason   Labs on Admission: I have personally reviewed labs and imaging studies  Time spent: 65 minutes  Darlin Priestly MD [facility]  If 7PM-7AM, please contact night-coverage 01/01/2023, 3:35 PM

## 2023-01-01 NOTE — Progress Notes (Signed)
ANTICOAGULATION CONSULT NOTE - Initial Consult  Pharmacy Consult for warfarin Indication: atrial fibrillation  No Known Allergies  Patient Measurements: Height: 5\' 4"  (162.6 cm) Weight: 80.3 kg (177 lb) IBW/kg (Calculated) : 59.2  Vital Signs: Temp: 97.9 F (36.6 C) (04/15 1843) Temp Source: Oral (04/15 1427) BP: 129/93 (04/15 1830) Pulse Rate: 91 (04/15 1830)  Labs: Recent Labs    01/01/23 1116 01/01/23 1356 01/01/23 1755  HGB 11.8*  --   --   HCT 37.6*  --   --   PLT 268  --   --   LABPROT  --   --  15.7*  INR  --   --  1.3*  CREATININE 1.81*  --   --   TROPONINIHS 52* 52*  --     Estimated Creatinine Clearance: 30.6 mL/min (A) (by C-G formula based on SCr of 1.81 mg/dL (H)).   Medical History: Past Medical History:  Diagnosis Date   Diabetes mellitus without complication    Hypertension    Memory loss     Medications:  Warfarin 5 mg Monday-Friday, Warfarin 7.5 mg Saturday and Sunday.  Assessment: 82 year old male PMH atrial fibrillation on warfarin (Chadsvasc at least 4), T2DM, HTN admitted with CHF exacerbation.    No DDI's identified  Goal of Therapy:  INR 2-3 Monitor platelets by anticoagulation protocol: Yes   Plan: INR 1.3 despite last dose warfarin 5mg  today. Will give additional warfarin 2.5 mg this evening.  Follow up INR tomorrow AM.   Jaynie Bream 01/01/2023,7:10 PM

## 2023-01-01 NOTE — ED Provider Notes (Signed)
[facility] Provider Note    Event Date/Time   First MD Initiated Contact with Patient 01/01/23 1334     (approximate)   History   Shortness of breath  HPI  Bob Brewer is a 82 y.o. male  who presents to the emergency department with primary complaint of shortness of breath. Shortness of breath progressively getting worse over the past week.  Shortness of breath is worse with exertion and lying flat.  They have also noticed swelling to his legs.  Went to primary care doctor's office today who was concerned for heart failure.  Family says patient does not have a history of heart failure although is on Lasix.  Triage note mention concern for EKG changes however family was not aware of this and per chart review patient has documented A-fib and left bundle branch block in the past.      Physical Exam   Triage Vital Signs: ED Triage Vitals  Enc Vitals Group     BP 01/01/23 1113 127/83     Pulse Rate 01/01/23 1113 (!) 53     Resp 01/01/23 1113 20     Temp --      Temp src --      SpO2 01/01/23 1113 94 %     Weight 01/01/23 1108 177 lb (80.3 kg)     Height 01/01/23 1108  (1.626 m)     Head Circumference --      Peak Flow --      Pain Score 01/01/23 1107 0     Pain Loc --      Pain Edu? --      Excl. in GC? --     Most recent vital signs: Vitals:   01/01/23 1113  BP: 127/83  Pulse: (!) 53  Resp: 20  SpO2: 94%   General: Awake, alert, oriented. CV:  Good peripheral perfusion. Bradycardia. Resp:  Normal effort. Lungs clear. Abd:  No distention.    ED Results / Procedures / Treatments   Labs (all labs ordered are listed, but only abnormal results are displayed) Labs Reviewed  BASIC METABOLIC PANEL - Abnormal; Notable for the following components:      Result Value   CO2 21 (*)    Glucose, Bld 233 (*)    BUN 46 (*)    Creatinine, Ser 1.81 (*)    GFR, Estimated 37 (*)    All other components within normal limits  CBC - Abnormal;  Notable for the following components:   RBC 3.94 (*)    Hemoglobin 11.8 (*)    HCT 37.6 (*)    All other components within normal limits  TROPONIN I (HIGH SENSITIVITY) - Abnormal; Notable for the following components:   Troponin I (High Sensitivity) 52 (*)    All other components within normal limits  TROPONIN I (HIGH SENSITIVITY)     EKG  I, Phineas Semen, attending physician, personally viewed and interpreted this EKG  EKG Time: 1112 Rate: 108 Rhythm: atrial fibrillation with RVR Axis: right axis deviation Intervals: qtc 431 QRS: LBBB ST changes: No st elevation Impression: abnormal ekg   RADIOLOGY I independently interpreted and visualized the CXR. My interpretation: bilateral pleural effusions Radiology interpretation:  IMPRESSION:  1. New small bilateral pleural effusions, left greater than right.  2. Hazy bibasilar airspace opacities may represent superimposed  atelectasis, but infection is not excluded.      PROCEDURES:  Critical Care performed: No    MEDICATIONS ORDERED  IN ED: Medications - No data to display   IMPRESSION / MDM / ASSESSMENT AND PLAN / ED COURSE  I reviewed the triage vital signs and the nursing notes.                              Differential diagnosis includes, but is not limited to, CHF, pneumonia  Patient's presentation is most consistent with acute presentation with potential threat to life or bodily function.   The patient is on the cardiac monitor to evaluate for evidence of arrhythmia and/or significant heart rate changes.   Patient presented to the emergency department today because of concerns for shortness of breath.  On exam patient has bilateral lower extremity edema.  He does have crackles in his lower lungs.  EKG is notable for A-fib with RVR.  Patient has documented A-fib in the past.  Per family however patient does not have history of CHF.  Chest x-ray is concerning for bilateral pleural effusions.  Radiologist  read as possible infection however given lack of fever or leukocytosis and clinical exam I have lower concern for infection.  At this time I do think CHF unlikely.  Discussed with the patient and patient's family.  Will start IV Lasix. Discussed with Dr. Fran Lowes with the hospitalist service will plan on admission.     FINAL CLINICAL IMPRESSION(S) / ED DIAGNOSES   Final diagnoses:  Shortness of breath  Peripheral edema     Note:  This document was prepared using Dragon voice recognition software and may include unintentional dictation errors.    Phineas Semen, MD 01/01/23 [phone]

## 2023-01-01 NOTE — ED Notes (Signed)
Pt given call light, non skid socks and fall band placed on pt. Bed alarm on. Pt wife went home for the night. Pt has a watch, cell phone, hat, shoes and socks at the bedside. Pt is in his own clothes at this time. Denies other needs

## 2023-01-02 ENCOUNTER — Observation Stay
Admit: 2023-01-02 | Discharge: 2023-01-02 | Disposition: A | Discharge status: Home / Self Care | Payer: Medicare Other | Attending: Hospitalist | Admitting: Hospitalist

## 2023-01-02 DIAGNOSIS — I509 Heart failure, unspecified: Secondary | ICD-10-CM | POA: Diagnosis not present

## 2023-01-02 DIAGNOSIS — F03A0 Unspecified dementia, mild, without behavioral disturbance, psychotic disturbance, mood disturbance, and anxiety: Secondary | ICD-10-CM | POA: Diagnosis present

## 2023-01-02 DIAGNOSIS — I5022 Chronic systolic (congestive) heart failure: Secondary | ICD-10-CM | POA: Diagnosis not present

## 2023-01-02 DIAGNOSIS — Z791 Long term (current) use of non-steroidal anti-inflammatories (NSAID): Secondary | ICD-10-CM | POA: Diagnosis not present

## 2023-01-02 DIAGNOSIS — R0602 Shortness of breath: Secondary | ICD-10-CM | POA: Diagnosis present

## 2023-01-02 DIAGNOSIS — I4891 Unspecified atrial fibrillation: Secondary | ICD-10-CM | POA: Diagnosis not present

## 2023-01-02 DIAGNOSIS — N183 Chronic kidney disease, stage 3 unspecified: Secondary | ICD-10-CM | POA: Diagnosis not present

## 2023-01-02 DIAGNOSIS — I447 Left bundle-branch block, unspecified: Secondary | ICD-10-CM | POA: Diagnosis present

## 2023-01-02 DIAGNOSIS — R0601 Orthopnea: Secondary | ICD-10-CM | POA: Diagnosis not present

## 2023-01-02 DIAGNOSIS — Z1152 Encounter for screening for COVID-19: Secondary | ICD-10-CM | POA: Diagnosis not present

## 2023-01-02 DIAGNOSIS — I2489 Other forms of acute ischemic heart disease: Secondary | ICD-10-CM | POA: Diagnosis present

## 2023-01-02 DIAGNOSIS — E1122 Type 2 diabetes mellitus with diabetic chronic kidney disease: Secondary | ICD-10-CM | POA: Diagnosis present

## 2023-01-02 DIAGNOSIS — I340 Nonrheumatic mitral (valve) insufficiency: Secondary | ICD-10-CM | POA: Diagnosis present

## 2023-01-02 DIAGNOSIS — Z87891 Personal history of nicotine dependence: Secondary | ICD-10-CM | POA: Diagnosis not present

## 2023-01-02 DIAGNOSIS — E1165 Type 2 diabetes mellitus with hyperglycemia: Secondary | ICD-10-CM | POA: Diagnosis not present

## 2023-01-02 DIAGNOSIS — I83018 Varicose veins of right lower extremity with ulcer other part of lower leg: Secondary | ICD-10-CM | POA: Diagnosis present

## 2023-01-02 DIAGNOSIS — I5043 Acute on chronic combined systolic (congestive) and diastolic (congestive) heart failure: Secondary | ICD-10-CM | POA: Diagnosis not present

## 2023-01-02 DIAGNOSIS — I83028 Varicose veins of left lower extremity with ulcer other part of lower leg: Secondary | ICD-10-CM | POA: Diagnosis present

## 2023-01-02 DIAGNOSIS — R0600 Dyspnea, unspecified: Secondary | ICD-10-CM | POA: Diagnosis not present

## 2023-01-02 DIAGNOSIS — Z7984 Long term (current) use of oral hypoglycemic drugs: Secondary | ICD-10-CM | POA: Diagnosis not present

## 2023-01-02 DIAGNOSIS — N1832 Chronic kidney disease, stage 3b: Secondary | ICD-10-CM | POA: Diagnosis present

## 2023-01-02 DIAGNOSIS — I482 Chronic atrial fibrillation, unspecified: Secondary | ICD-10-CM | POA: Diagnosis not present

## 2023-01-02 DIAGNOSIS — T501X5A Adverse effect of loop [high-ceiling] diuretics, initial encounter: Secondary | ICD-10-CM | POA: Diagnosis not present

## 2023-01-02 DIAGNOSIS — Z7901 Long term (current) use of anticoagulants: Secondary | ICD-10-CM | POA: Diagnosis not present

## 2023-01-02 DIAGNOSIS — I429 Cardiomyopathy, unspecified: Secondary | ICD-10-CM | POA: Diagnosis present

## 2023-01-02 DIAGNOSIS — F0390 Unspecified dementia without behavioral disturbance: Secondary | ICD-10-CM | POA: Diagnosis not present

## 2023-01-02 DIAGNOSIS — I4819 Other persistent atrial fibrillation: Secondary | ICD-10-CM | POA: Diagnosis present

## 2023-01-02 DIAGNOSIS — I130 Hypertensive heart and chronic kidney disease with heart failure and stage 1 through stage 4 chronic kidney disease, or unspecified chronic kidney disease: Secondary | ICD-10-CM | POA: Diagnosis present

## 2023-01-02 DIAGNOSIS — Z79899 Other long term (current) drug therapy: Secondary | ICD-10-CM | POA: Diagnosis not present

## 2023-01-02 DIAGNOSIS — L97819 Non-pressure chronic ulcer of other part of right lower leg with unspecified severity: Secondary | ICD-10-CM | POA: Diagnosis present

## 2023-01-02 LAB — BASIC METABOLIC PANEL
Anion gap: 11 (ref 5–15)
BUN: 42 mg/dL — ABNORMAL HIGH (ref 8–23)
CO2: 22 mmol/L (ref 22–32)
Calcium: 8.7 mg/dL — ABNORMAL LOW (ref 8.9–10.3)
Chloride: 107 mmol/L (ref 98–111)
Creatinine, Ser: 1.73 mg/dL — ABNORMAL HIGH (ref 0.61–1.24)
GFR, Estimated: 39 mL/min — ABNORMAL LOW (ref >60)
Glucose, Bld: 160 mg/dL — ABNORMAL HIGH (ref 70–99)
Potassium: 4.4 mmol/L (ref 3.5–5.1)
Sodium: 140 mmol/L (ref 135–145)

## 2023-01-02 LAB — PROTIME-INR
INR: 1.4 — ABNORMAL HIGH (ref 0.8–1.2)
Prothrombin Time: 16.9 seconds — ABNORMAL HIGH (ref 11.4–15.2)

## 2023-01-02 LAB — ECHOCARDIOGRAM COMPLETE
AR max vel: 2.86 cm2
AV Area VTI: 4.78 cm2
AV Area mean vel: 3.2 cm2
AV Mean grad: 1 mmHg
AV Peak grad: 2.4 mmHg
Ao pk vel: 0.78 m/s
Area-P 1/2: 6.48 cm2
Calc EF: 21.1 %
Height: 64 in
MV M vel: 4.22 m/s
MV Peak grad: 71.2 mmHg
Radius: 1 cm
S' Lateral: 4.4 cm
Single Plane A2C EF: 19.7 %
Single Plane A4C EF: 21.2 %
Weight: 2832 oz

## 2023-01-02 LAB — CBC
HCT: 36.8 % — ABNORMAL LOW (ref 39.0–52.0)
Hemoglobin: 11.6 g/dL — ABNORMAL LOW (ref 13.0–17.0)
MCH: 30.1 pg (ref 26.0–34.0)
MCHC: 31.5 g/dL (ref 30.0–36.0)
MCV: 95.3 fL (ref 80.0–100.0)
Platelets: 254 10*3/uL (ref 150–400)
RBC: 3.86 MIL/uL — ABNORMAL LOW (ref 4.22–5.81)
RDW: 13.8 % (ref 11.5–15.5)
WBC: 10.1 10*3/uL (ref 4.0–10.5)
nRBC: 0 % (ref 0.0–0.2)

## 2023-01-02 LAB — HEMOGLOBIN A1C
Hgb A1c MFr Bld: 7.7 % — ABNORMAL HIGH (ref 4.8–5.6)
Mean Plasma Glucose: 174.29 mg/dL

## 2023-01-02 LAB — MAGNESIUM: Magnesium: 1.4 mg/dL — ABNORMAL LOW (ref 1.7–2.4)

## 2023-01-02 MED ORDER — METOPROLOL TARTRATE 25 MG PO TABS
12.5000 mg | ORAL_TABLET | Freq: Four times a day (QID) | ORAL | Status: DC
  Administered 2023-01-02 – 2023-01-03 (×4): 12.5 mg via ORAL
  Filled 2023-01-02 (×4): qty 1

## 2023-01-02 MED ORDER — MAGNESIUM SULFATE 2 GM/50ML IV SOLN
2.0000 g | Freq: Once | INTRAVENOUS | Status: AC
  Administered 2023-01-02: 2 g via INTRAVENOUS
  Filled 2023-01-02: qty 50

## 2023-01-02 MED ORDER — WARFARIN SODIUM 7.5 MG PO TABS
7.5000 mg | ORAL_TABLET | Freq: Once | ORAL | Status: AC
  Administered 2023-01-02: 7.5 mg via ORAL
  Filled 2023-01-02: qty 1

## 2023-01-02 MED ORDER — FUROSEMIDE 10 MG/ML IJ SOLN
40.0000 mg | Freq: Two times a day (BID) | INTRAMUSCULAR | Status: DC
  Administered 2023-01-02 (×2): 40 mg via INTRAVENOUS
  Filled 2023-01-02 (×2): qty 4

## 2023-01-02 NOTE — Progress Notes (Signed)
  PROGRESS NOTE    Bob Brewer  GNF:[id number] DOB: Feb 10, 1941 DOA: 01/01/2023 PCP: Corky Downs, MD  [id number]  LOS: 0 days   Brief hospital course:   Assessment & Plan: Bob Brewer is a 82 y.o. male with medical history significant of chronic afib on warfarin, mild dementia, DM2 and HTN who was sent from clinic for evaluation of dyspnea and swelling.    # Acute on chronic combined CHF exacerbation --dyspnea, orthopnea, BNP 2466, CXR with new bilateral pleural effusion, and BLE edema. --s/p IV lasix 60 mg x1 in the ED --LVEF of 45-50% back in Jan 2023.  Current Echo showed LVEF 25-30% with grade II DD. Plan: --cardio consult ([facility] to see) --IV lasix 40 BID, per cardio --Lopressor 12.5 mg q6h, per cardio   # Chronic Afib on home warfarin --rate in 100's Plan: --switch home Toprol to Lopressor 12.5 mg q6h, per cardio --cont home warfarin per pharm dosing --consider DOAC if it's affordable    # DM2 --Pt on metformin at home. --A1c 7.7. --hold metformin while inpatient.  Consider increasing metformin or adding another oral agent at discharge.  # Mild dementia --cont home Aricept and memantine   # HTN --hold home Lisinopril while diuesing  CKD 3b  Hypomag --monitor and replete with IV  # Demand ischemia Troponin borderline elevated and flat trending at 52, 52, in the absence of chest pain acute decompensated heart failure, this is most consistent with demand/supply mismatch and not ACS    DVT prophylaxis: [id number] Code Status: Full code  Family Communication:  Level of care: Telemetry Cardiac Dispo:   The patient is from: home Anticipated d/c is to: home Anticipated d/c date is: 2-3 days Patient currently is not medically ready to d/c due to: on IV lasix   Subjective and Interval History:  Pt reported breathing improved, and making good amount of urine (even though none charted).   Objective: Vitals:   01/02/23 1000 01/02/23 1020 01/02/23 1600  01/02/23 1808  BP: 106/75 106/75 (!) 114/94 (!) 136/96  Pulse:  94 96 100  Resp: Temp:  97.8 F (36.6 C) 98.2 F (36.8 C)   TempSrc:   Oral   SpO2:  95% 98% 92%  Weight:      Height:       No intake or output data in the 24 hours ending 01/02/23 1822 Filed Weights   01/01/23 1108  Weight: 80.3 kg    Examination:   Constitutional: NAD, more sleepy today, oriented to person and place HEENT: conjunctivae and lids normal, EOMI CV: No cyanosis.   RESP: normal respiratory effort Extremities: edema in BLE improved SKIN: warm, dry   Data Reviewed: I have personally reviewed labs and imaging studies  Time spent: 50 minutes  Darlin Priestly, MD [facility] If 7PM-7AM, please contact night-coverage 01/02/2023, 6:22 PM

## 2023-01-02 NOTE — ED Notes (Signed)
This RN assisted pt to ambulated to toilet.

## 2023-01-02 NOTE — ED Notes (Addendum)
Pt given apple juice and crackers. PT got frustrated that he was still here, and did not think he was getting better. Pt reports his throat hurting, and after having a drink reports that got better. Pt given TV remote and explained that its not always a quick process pt understanding at this time. Bed alarm on

## 2023-01-02 NOTE — Progress Notes (Signed)
*  PRELIMINARY RESULTS* Echocardiogram 2D Echocardiogram has been performed.  Bob Brewer 01/02/2023, 8:45 AM

## 2023-01-02 NOTE — Progress Notes (Signed)
ANTICOAGULATION CONSULT NOTE - Initial Consult  Pharmacy Consult for warfarin Indication: atrial fibrillation  No Known Allergies  Patient Measurements: Height:  (162.6 cm) Weight: 80.3 kg (177 lb) IBW/kg (Calculated) : 59.2  Vital Signs: Temp: 97.8 F (36.6 C) (04/16 1020) BP: 106/75 (04/16 1020) Pulse Rate: 94 (04/16 1020)  Labs: Recent Labs    01/01/23 1116 01/01/23 1356 01/01/23 1755 01/02/23 0449  HGB 11.8*  --   --  11.6*  HCT 37.6*  --   --  36.8*  PLT 268  --   --  254  LABPROT  --   --  15.7* 16.9*  INR  --   --  1.3* 1.4*  CREATININE 1.81*  --   --  1.73*  TROPONINIHS 52* 52*  --   --      Estimated Creatinine Clearance: 32 mL/min (A) (by C-G formula based on SCr of 1.73 mg/dL (H)).   Medical History: Past Medical History:  Diagnosis Date   Diabetes mellitus without complication    Hypertension    Memory loss     Medications:  Warfarin 5 mg Monday-Friday, Warfarin 7.5 mg Saturday and Sunday (40 mg /week).  Assessment: 82 year old male PMH atrial fibrillation on warfarin (Chadsvasc at least 4), T2DM, HTN admitted with CHF exacerbation.    No DDI's identified  Goal of Therapy:  INR 2-3 Monitor platelets by anticoagulation protocol: Yes   Date INR Warfarin dose/comments  4/15 1.3  2.5mg  x 1  (plus  PTA)  4/16 1.4    Plan: INR of 1.4 remains below desired goal range of 2-3 after booster dose given last night. Will order warfarin 7.5mg  x1 dose today and continue daily INR with AM labs.   Catlyn Shipton Rodriguez-Guzman PharmD, BCPS 01/02/2023 10:41 AM

## 2023-01-02 NOTE — Consult Note (Signed)
[facility] CLINIC CARDIOLOGY CONSULT NOTE       Patient ID: Bob Brewer MRN: [id number] DOB/AGE: [birth year] 82 y.o.  Admit date: 01/01/2023 Referring Physician Dr Darlin Priestly Primary Physician Kara Dies, NP Primary Cardiologist prev Dr. Beatrix Fetters (last seen 08/2021)  Reason for Consultation CHF  HPI: Bob Brewer is an 82yoM with a PMH of HFmrEF (45-50% 09/2021), chronic LBBB, persistent atrial fibrillation (warfarin), DM2, HTN, memory impairment, who presented to [facility] ED 01/01/2023 with shortness of breath and peripheral edema worsening over 2-3 weeks.  Cardiology is consulted for assistance with his heart failure.  The patient was seen in clinic for 1 visit with Dr. Beatrix Fetters in December 2022 where the patient was having heart failure symptoms of dyspnea on exertion mild peripheral edema and orthopnea.  He was started on p.o. Lasix 40 mg daily, and metoprolol succinate 25 mg daily. EKG in the office showed atrial fibrillation with RVR.  Historically anticoagulated with warfarin, although at that visit the patient was not taking it because he missed an appointment?  Subsequently had an echocardiogram done which resulted below with mild LV systolic dysfunction EF of 45-50%, and moderate mitral regurgitation.  Patient was lost to follow-up since that visit.  He presents to [facility] with shortness of breath, orthopnea, and bilateral lower extremity swelling per the admission H&P had been occurring for the past 2 to 3 weeks.  His neighbor is at bedside today helps contribute to the history and says that his wife only noticed his peripheral edema 1 week ago and he was a little short winded when he would walk.  He does not recall ever having seen a cardiologist in the past.  He tells me he is here primarily because his wife was concerned about him.  At my time of evaluation he is sitting in the ED stretcher with his breakfast at bedside.  He denies chest pain or shortness of breath while at rest, sleeps in a  recliner at night, occasionally can feel his heart beating but denies palpitations or heart racing.  His peripheral edema is significant but overall improved compared to yesterday.   Vitals are notable for blood pressure of 107/78, heart rate in thhe 110s-120s in AF on telemetry.  Currently requiring 2 L of oxygen by nasal cannula.  Labs are notable for improving renal function with BUN/creatinine downtrending from 46/1.81 and GFR 37 to BUN/creatinine 42/1.73 and GFR 39 respectively.  Potassium within normal limits at 4.4, magnesium low at 1.4.  BNP markedly elevated on admission at 2466, high-sensitivity troponin elevated and flat trending at 52, 52.  No leukocytosis with WBCs 10.1, H&H stable at 11.6/36.8.  INR subtherapeutic at 1.4 today.  Review of systems complete and found to be negative unless listed above     Past Medical History:  Diagnosis Date   Diabetes mellitus without complication    Hypertension    Memory loss     Past Surgical History:  Procedure Laterality Date   BACK SURGERY  1992   also 82    (Not in a hospital admission)  Social History   Socioeconomic History   Marital status: Married    Spouse name: Bob Brewer   Number of children: Not on file   Years of education: Not on file   Highest education level: 12th grade  Occupational History   Occupation: Retired  Tobacco Use   Smoking status: Former    Types: Cigarettes   Smokeless tobacco: Never  Substance and Sexual Activity   Alcohol  use: Not Currently   Drug use: Never   Sexual activity: Not Currently  Other Topics Concern   Not on file  Social History Narrative   Life with wife   Right handed   Drinks 5-6 cups caffeine daily   Social Determinants of Health   Financial Resource Strain: Low Risk  (08/30/2021)   Overall Financial Resource Strain (CARDIA)    Difficulty of Paying Living Expenses: Not hard at all  Food Insecurity: No Food Insecurity (08/30/2021)   Hunger Vital Sign    Worried About  Running Out of Food in the Last Year: Never true    Ran Out of Food in the Last Year: Never true  Transportation Needs: No Transportation Needs (08/30/2021)   PRAPARE - Administrator, Civil Service (Medical): No    Lack of Transportation (Non-Medical): No  Physical Activity: Inactive (08/30/2021)   Exercise Vital Sign    Days of Exercise per Week: 0 days    Minutes of Exercise per Session: 0 min  Stress: No Stress Concern Present (08/30/2021)   [employer] of Occupational Health - Occupational Stress Questionnaire    Feeling of Stress : Not at all  Social Connections: Socially Integrated (08/30/2021)   Social Connection and Isolation Panel [NHANES]    Frequency of Communication with Friends and Family: More than three times a week    Frequency of Social Gatherings with Friends and Family: Twice a week    Attends Religious Services: More than 4 times per year    Active Member of [employer] or Organizations: Yes    Attends Banker Meetings: More than 4 times per year    Marital Status: Married  Catering manager Violence: Not At Risk (08/30/2021)   Humiliation, Afraid, Rape, and Kick questionnaire    Fear of Current or Ex-Partner: No    Emotionally Abused: No    Physically Abused: No    Sexually Abused: No    History reviewed. No pertinent family history.   No intake or output data in the 24 hours ending 01/02/23 0758  Vitals:   01/02/23 0600 01/02/23 0601 01/02/23 0601 01/02/23 0700  BP: 98/63   107/78  Pulse: (!) 115   72  Resp: (!) 30   20  Temp:      TempSrc:      SpO2: 95% (S) (!) 88% 95%   Weight:      Height:        PHYSICAL EXAM General: Elderly male, well nourished, in no acute distress.  Sitting at angle in ED stretcher with his neighbor at bedside. HEENT:  Normocephalic and atraumatic. Neck:  No JVD.  Lungs: Normal respiratory effort on room air.  Bibasilar crackles without appreciable wheezes.   Heart: Tachycardic irregularly  irregular. Normal S1 and S2 without gallops or murmurs.  Abdomen: Non-distended appearing.  Msk: Normal strength and tone for age. Extremities: Cool to touch, 2+ bilateral lower extremity with skin dimpling and wrinkling in both feet.  Left shin with 3 discrete chronic appearing ulcers, the largest about a quarter size in diameter.   Neuro: Awake and alert, is a limited historian Psych:  Answers questions appropriately.   Labs: Basic Metabolic Panel: Recent Labs    01/01/23 1116 01/02/23 0449  NA 138 140  K 4.3 4.4  CL 105 107  CO2 21* 22  GLUCOSE 233* 160*  BUN 46* 42*  CREATININE 1.81* 1.73*  CALCIUM 8.9 8.7*  MG  --  1.4*   Liver  Function Tests: No results for input(s): "AST", "ALT", "ALKPHOS", "BILITOT", "PROT", "ALBUMIN" in the last 72 hours. No results for input(s): "LIPASE", "AMYLASE" in the last 72 hours. CBC: Recent Labs    01/01/23 1116 01/02/23 0449  WBC 8.0 10.1  HGB 11.8* 11.6*  HCT 37.6* 36.8*  MCV 95.4 95.3  PLT 268 254   Cardiac Enzymes: Recent Labs    01/01/23 1116 01/01/23 1356  TROPONINIHS 52* 52*   BNP: Recent Labs    01/01/23 1116  BNP 2,466.0*   D-Dimer: No results for input(s): "DDIMER" in the last 72 hours. Hemoglobin A1C: No results for input(s): "HGBA1C" in the last 72 hours. Fasting Lipid Panel: No results for input(s): "CHOL", "HDL", "LDLCALC", "TRIG", "CHOLHDL", "LDLDIRECT" in the last 72 hours. Thyroid Function Tests: No results for input(s): "TSH", "T4TOTAL", "T3FREE", "THYROIDAB" in the last 72 hours.  Invalid input(s): "FREET3" Anemia Panel: No results for input(s): "VITAMINB12", "FOLATE", "FERRITIN", "TIBC", "IRON", "RETICCTPCT" in the last 72 hours.   Radiology: DG Chest 2 View  Result Date: 01/01/2023 CLINICAL DATA:  Shortness of breath EXAM: CHEST - 2 VIEW COMPARISON:  CXR 06/15/12 FINDINGS: New small bilateral pleural effusions, left greater than right. Hazy bibasilar airspace opacities may represent superimposed  atelectasis, but infection is not excluded. Borderline cardiomegaly. No radiographically apparent displaced rib fractures. Visualized upper abdomen is unremarkable. Degenerative changes of the bilateral glenohumeral and AC joints. IMPRESSION: 1. New small bilateral pleural effusions, left greater than right. 2. Hazy bibasilar airspace opacities may represent superimposed atelectasis, but infection is not excluded. Electronically Signed   By: Lorenza Cambridge M.D.   On: 01/01/2023 11:49    ECHO 09/22/2021 DOPPLER ECHO and OTHER SPECIAL PROCEDURES                 Aortic: TRIVIAL AR                 No AS                         98.0 cm/sec peak vel       3.8 mmHg peak grad                         2.3 mmHg mean grad         2.5 cm^2 by DOPPLER                 Mitral: MODERATE MR                No MS                         MV Inflow E Vel = 148.0 cm/sec      MV Annulus E'Vel = nm*                         E/E'Ratio = nm*              Tricuspid: MILD TR                    No TS                         269.9 cm/sec peak TR vel   32.2 mmHg peak RV pressure              Pulmonary: TRIVIAL PR  No PS  _________________________________________________________________________________________  INTERPRETATION  MILD LV SYSTOLIC DYSFUNCTION (See above)  NORMAL RIGHT VENTRICULAR SYSTOLIC FUNCTION  MODERATE VALVULAR REGURGITATION (See above)  NO VALVULAR STENOSIS  AF WITH RVR PREVENTS TOTALLY ACCURATE ASSESSMENT OF EF, BUT PROBABLY EF ~ 45-50%  ABNORMAL SEPTAL MOTION D/T LBBB   TELEMETRY reviewed by me (LT) 01/02/2023 : Atrial fibrillation rate 110s to 120s.  EKG reviewed by me: AF LBBB rate 108   Data reviewed by me (LT) 01/02/2023: Last outpatient cardiology note, ED note, admission H&P last 24h vitals tele labs imaging I/O   Principal Problem:   Dyspnea    ASSESSMENT AND PLAN:  Prince Rome is an 24yoM with a PMH of HFmrEF (45-50% 09/2021), chronic LBBB, persistent atrial fibrillation  (warfarin), DM2, HTN, memory impairment, who presented to [facility] ED 01/01/2023 with shortness of breath and peripheral edema worsening over 2-3 weeks.  Cardiology is consulted for assistance with his heart failure.  # Acute on chronic HFmrEF # Chronic LBBB Presents with peripheral edema and dyspnea on exertion worsening for at least a week per the patient's neighbor at bedside.  BNP markedly elevated at 2400 and chest x-ray with bilateral pleural effusions.  Unclear regarding dietary indiscretions or medication compliance as the patient is a limited historian with some baseline memory impairment.  Initial clinical improvement after first dose of IV Lasix, but remains clinically hypervolemic on exam today with 2+ pitting lower extremity edema and cool extremities.  Anticipate need for several days of diuresis. -S/p IV Lasix 60 mg x 1, continue IV Lasix 40 mg twice daily for now -Continue GDMT with metoprolol.  Consider addition of ACE/ARB/Entresto, SGLT2i, MRA as BP and renal function allow. -Strict I's/O, salt restriction -Echo complete performed this morning, pending read  # Persistent atrial fibrillation Rate 110s to 120s currently, no symptomatic palpitations or heart racing. -Switch metoprolol XL to metoprolol tartrate 12.5 mg every 6 hours for rate control -Continue warfarin per pharmacy protocol for goal INR 2.0-3.0.  INR subtherapeutic on admission at 1.3 -Reportedly could not afford DOAC in the past, may revisit this if compliance with warfarin remains an issue -Monitor and replenish electrolytes.  Goal K >4, mag >2  # CKD 3 Baseline from 08/2021 was a creatinine of 1.6 and GFR 42.  Creatinine improving with initial diuresis from 1.81--1.73 and current GFR of 39.  Monitor closely with diuresis.  # Demand ischemia Troponin borderline elevated and flat trending at 52, 52, in the absence of chest pain acute decompensated heart failure, this is most consistent with demand/supply mismatch and  not ACS   This patient's plan of care was discussed and created with Dr. Juliann Pares and he is in agreement.  Signed: Rebeca Allegra , PA-C 01/02/2023, 7:58 AM [facility]

## 2023-01-03 DIAGNOSIS — I5043 Acute on chronic combined systolic (congestive) and diastolic (congestive) heart failure: Secondary | ICD-10-CM

## 2023-01-03 DIAGNOSIS — F0390 Unspecified dementia without behavioral disturbance: Secondary | ICD-10-CM | POA: Diagnosis not present

## 2023-01-03 DIAGNOSIS — I482 Chronic atrial fibrillation, unspecified: Secondary | ICD-10-CM | POA: Diagnosis not present

## 2023-01-03 LAB — BASIC METABOLIC PANEL
Anion gap: 10 (ref 5–15)
BUN: 40 mg/dL — ABNORMAL HIGH (ref 8–23)
CO2: 22 mmol/L (ref 22–32)
Calcium: 8.7 mg/dL — ABNORMAL LOW (ref 8.9–10.3)
Chloride: 104 mmol/L (ref 98–111)
Creatinine, Ser: 1.74 mg/dL — ABNORMAL HIGH (ref 0.61–1.24)
GFR, Estimated: 39 mL/min — ABNORMAL LOW (ref >60)
Glucose, Bld: 155 mg/dL — ABNORMAL HIGH (ref 70–99)
Potassium: 4.5 mmol/L (ref 3.5–5.1)
Sodium: 136 mmol/L (ref 135–145)

## 2023-01-03 LAB — PROTIME-INR
INR: 1.5 — ABNORMAL HIGH (ref 0.8–1.2)
Prothrombin Time: 17.9 seconds — ABNORMAL HIGH (ref 11.4–15.2)

## 2023-01-03 LAB — CBC
HCT: 33 % — ABNORMAL LOW (ref 39.0–52.0)
Hemoglobin: 10.8 g/dL — ABNORMAL LOW (ref 13.0–17.0)
MCH: 30 pg (ref 26.0–34.0)
MCHC: 32.7 g/dL (ref 30.0–36.0)
MCV: 91.7 fL (ref 80.0–100.0)
Platelets: 243 10*3/uL (ref 150–400)
RBC: 3.6 MIL/uL — ABNORMAL LOW (ref 4.22–5.81)
RDW: 13.9 % (ref 11.5–15.5)
WBC: 8.1 10*3/uL (ref 4.0–10.5)
nRBC: 0 % (ref 0.0–0.2)

## 2023-01-03 LAB — MAGNESIUM: Magnesium: 1.8 mg/dL (ref 1.7–2.4)

## 2023-01-03 MED ORDER — WARFARIN SODIUM 7.5 MG PO TABS
7.5000 mg | ORAL_TABLET | Freq: Once | ORAL | Status: AC
  Administered 2023-01-03: 7.5 mg via ORAL
  Filled 2023-01-03: qty 1

## 2023-01-03 MED ORDER — FUROSEMIDE 10 MG/ML IJ SOLN
40.0000 mg | Freq: Once | INTRAMUSCULAR | Status: AC
  Administered 2023-01-03: 40 mg via INTRAVENOUS
  Filled 2023-01-03: qty 4

## 2023-01-03 MED ORDER — FUROSEMIDE 10 MG/ML IJ SOLN
10.0000 mg/h | INTRAVENOUS | Status: DC
  Administered 2023-01-03 – 2023-01-04 (×2): 8 mg/h via INTRAVENOUS
  Administered 2023-01-05 – 2023-01-06 (×2): 10 mg/h via INTRAVENOUS
  Filled 2023-01-03 (×5): qty 20

## 2023-01-03 MED ORDER — METOPROLOL TARTRATE 25 MG PO TABS
12.5000 mg | ORAL_TABLET | Freq: Three times a day (TID) | ORAL | Status: DC
  Administered 2023-01-03 – 2023-01-04 (×4): 12.5 mg via ORAL
  Filled 2023-01-03 (×6): qty 1

## 2023-01-03 NOTE — Progress Notes (Signed)
PROGRESS NOTE    Bob Brewer  ZOX:[id number] DOB: 1941-05-03 DOA: 01/01/2023 PCP: Corky Downs, MD   Assessment & Plan:   Principal Problem:   Dyspnea  Assessment and Plan: Acute on chronic combined CHF exacerbation: dyspnea, orthopnea, BNP 2466, CXR with new bilateral pleural effusion, and BLE edema. Continue on IV lasix drip as per cardio. Continue on metoprolol. Monitor I/Os   Chronic a. fib: continue on metoprolol, warafin. Pharmacy to dose & monitor warfarin   DM2: poorly controlled, HbA1c 7.7. Continue on SSI w/ accuchecks    Dementia: continue on home dose of aricept, memantine    HTN: holding home dose of lisinopril   CKDIIIb: Cr is labile. Avoid nephrotoxic meds    Hypomagnesemia: WNL today    Demand ischemia: troponins are minimally elevated. This is most consistent with demand/supply mismatch and not ACS        DVT prophylaxis: warfarin Code Status: full  Family Communication:  Disposition Plan: depends on PT/OT recs (not consulted yet)   Level of care: Telemetry Cardiac Status is: Inpatient Remains inpatient appropriate because: severity of illness    Consultants:  Cardio   Procedures:   Antimicrobials  Subjective: Pt c/o shortness of breath   Objective: Vitals:   01/02/23 2043 01/03/23 0000 01/03/23 0440 01/03/23 0729  BP: 104/77 (!) 119/109 114/88 107/88  Pulse: 84 91 85 98  Resp: Temp: 97.9 F (36.6 C) 98 F (36.7 C) 97.8 F (36.6 C) 97.8 F (36.6 C)  TempSrc: Oral Oral Oral   SpO2: 94% 99% 100% 100%  Weight:      Height:        Intake/Output Summary (Last 24 hours) at 01/03/2023 0936 Last data filed at 01/03/2023 0600 Gross per 24 hour  Intake 480 ml  Output 600 ml  Net -120 ml   Filed Weights   01/01/23 1108  Weight: 80.3 kg    Examination:  General exam: Appears calm and comfortable  Respiratory system: decreased breath sounds b/l. Cardiovascular system: irregularly irregular. No  rubs, gallops or  clicks.  Gastrointestinal system: Abdomen is nondistended, soft and nontender.  Normal bowel sounds heard. Central nervous system: Alert and awake. Moves all extremities Psychiatry: Judgement and insight appears poor. Flat mood and affect      Data Reviewed: I have personally reviewed following labs and imaging studies  CBC: Recent Labs  Lab 01/01/23 1116 01/02/23 0449 01/03/23 0530  WBC 8.0 10.1 8.1  HGB 11.8* 11.6* 10.8*  HCT 37.6* 36.8* 33.0*  MCV 95.4 95.3 91.7  PLT 268 254 243   Basic Metabolic Panel: Recent Labs  Lab 01/01/23 1116 01/02/23 0449 01/03/23 0530  NA 138 140 136  K 4.3 4.4 4.5  CL 105 107 104  CO2 21* 22 22  GLUCOSE 233* 160* 155*  BUN 46* 42* 40*  CREATININE 1.81* 1.73* 1.74*  CALCIUM 8.9 8.7* 8.7*  MG  --  1.4* 1.8   GFR: Estimated Creatinine Clearance: 31.8 mL/min (A) (by C-G formula based on SCr of 1.74 mg/dL (H)). Liver Function Tests: No results for input(s): "AST", "ALT", "ALKPHOS", "BILITOT", "PROT", "ALBUMIN" in the last 168 hours. No results for input(s): "LIPASE", "AMYLASE" in the last 168 hours. No results for input(s): "AMMONIA" in the last 168 hours. Coagulation Profile: Recent Labs  Lab 01/01/23 1755 01/02/23 0449 01/03/23 0530  INR 1.3* 1.4* 1.5*   Cardiac Enzymes: No results for input(s): "CKTOTAL", "CKMB", "CKMBINDEX", "TROPONINI" in the last 168 hours. BNP (last  3 results) No results for input(s): "PROBNP" in the last 8760 hours. HbA1C: Recent Labs    01/02/23 0449  HGBA1C 7.7*   CBG: No results for input(s): "GLUCAP" in the last 168 hours. Lipid Profile: No results for input(s): "CHOL", "HDL", "LDLCALC", "TRIG", "CHOLHDL", "LDLDIRECT" in the last 72 hours. Thyroid Function Tests: No results for input(s): "TSH", "T4TOTAL", "FREET4", "T3FREE", "THYROIDAB" in the last 72 hours. Anemia Panel: No results for input(s): "VITAMINB12", "FOLATE", "FERRITIN", "TIBC", "IRON", "RETICCTPCT" in the last 72 hours. Sepsis  Labs: No results for input(s): "PROCALCITON", "LATICACIDVEN" in the last 168 hours.  Recent Results (from the past 240 hour(s))  SARS Coronavirus 2 by RT PCR (hospital order, performed in [facility] hospital lab) *cepheid single result test* Anterior Nasal Swab     Status: None   Collection Time: 01/01/23  5:49 PM   Specimen: Anterior Nasal Swab  Result Value Ref Range Status   SARS Coronavirus 2 by RT PCR NEGATIVE NEGATIVE Final    Comment: (NOTE) SARS-CoV-2 target nucleic acids are NOT DETECTED.  The SARS-CoV-2 RNA is generally detectable in upper and lower respiratory specimens during the acute phase of infection. The lowest concentration of SARS-CoV-2 viral copies this assay can detect is 250 copies / mL. A negative result does not preclude SARS-CoV-2 infection and should not be used as the sole basis for treatment or other patient management decisions.  A negative result may occur with improper specimen collection / handling, submission of specimen other than nasopharyngeal swab, presence of viral mutation(s) within the areas targeted by this assay, and inadequate number of viral copies (<250 copies / mL). A negative result must be combined with clinical observations, patient history, and epidemiological information.  Fact Sheet for Patients:   RoadLapTop.co.za  Fact Sheet for Healthcare Providers: http://kim-miller.com/  This test is not yet approved or  cleared by the [place] FDA and has been authorized for detection and/or diagnosis of SARS-CoV-2 by FDA under an Emergency Use Authorization (EUA).  This EUA will remain in effect (meaning this test can be used) for the duration of the COVID-19 declaration under Section 564(b)(1) of the Act, 21 U.S.C. section 360bbb-3(b)(1), unless the authorization is terminated or revoked sooner.  Performed at [facility], [address] [place], [place] [address]           Radiology Studies: ECHOCARDIOGRAM COMPLETE  Result Date: 01/02/2023    ECHOCARDIOGRAM REPORT   Patient Name:   Bob Brewer Date of Exam: 01/02/2023 Medical Rec #:  [id number]       Height:       64.0 in Accession #:    [id number]      Weight:       177.0 lb Date of Birth:  06/13/1941       BSA:          1.857 m Patient Age:    82 years        BP:           98/63 mmHg Patient Gender: M               HR:           115 bpm. Exam Location:  [facility] Procedure: 2D Echo, Cardiac Doppler, Color Doppler and Strain Analysis Indications:     Dyspnea R06.00  History:         Patient has no prior history of Echocardiogram examinations.  Risk Factors:Diabetes and Hypertension.  Sonographer:     Cristela Blue Referring Phys:  [id number] Inetta Fermo LAI Diagnosing Phys: Alwyn Pea MD  Sonographer Comments: Global longitudinal strain was attempted. IMPRESSIONS  1. Left ventricular ejection fraction, by estimation, is 25 to 30%. The left ventricle has severely decreased function. The left ventricle demonstrates global hypokinesis. The left ventricular internal cavity size was mildly to moderately dilated. Left ventricular diastolic parameters are consistent with Grade II diastolic dysfunction (pseudonormalization).  2. Right ventricular systolic function is low normal. The right ventricular size is mildly enlarged.  3. Left atrial size was mildly dilated.  4. Right atrial size was mildly dilated.  5. The mitral valve is normal in structure. Mild mitral valve regurgitation.  6. The aortic valve was not well visualized. Aortic valve regurgitation is trivial. FINDINGS  Left Ventricle: Left ventricular ejection fraction, by estimation, is 25 to 30%. The left ventricle has severely decreased function. The left ventricle demonstrates global hypokinesis. The left ventricular internal cavity size was mildly to moderately dilated. There is borderline left ventricular hypertrophy. Left ventricular diastolic parameters  are consistent with Grade II diastolic dysfunction (pseudonormalization). Right Ventricle: The right ventricular size is mildly enlarged. No increase in right ventricular wall thickness. Right ventricular systolic function is low normal. Left Atrium: Left atrial size was mildly dilated. Right Atrium: Right atrial size was mildly dilated. Pericardium: There is no evidence of pericardial effusion. Mitral Valve: The mitral valve is normal in structure. Mild mitral valve regurgitation. Tricuspid Valve: The tricuspid valve is normal in structure. Tricuspid valve regurgitation is mild. Aortic Valve: The aortic valve was not well visualized. Aortic valve regurgitation is trivial. Aortic valve mean gradient measures 1.0 mmHg. Aortic valve peak gradient measures 2.4 mmHg. Aortic valve area, by VTI measures 4.78 cm. Pulmonic Valve: The pulmonic valve was normal in structure. Pulmonic valve regurgitation is not visualized. Aorta: The ascending aorta was not well visualized. IAS/Shunts: No atrial level shunt detected by color flow Doppler.  LEFT VENTRICLE PLAX 2D LVIDd:         5.00 cm      Diastology LVIDs:         4.40 cm      LV e' medial:    5.44 cm/s LV PW:         1.10 cm      LV E/e' medial:  25.7 LV IVS:        1.30 cm      LV e' lateral:   7.83 cm/s LVOT diam:     2.00 cm      LV E/e' lateral: 17.9 LV SV:         35 LV SV Index:   19 LVOT Area:     3.14 cm                              3D Volume EF: LV Volumes (MOD)            3D EF:        30 % LV vol d, MOD A2C: 147.0 ml LV EDV:       222 ml LV vol d, MOD A4C: 165.0 ml LV ESV:       155 ml LV vol s, MOD A2C: 118.0 ml LV SV:        66 ml LV vol s, MOD A4C: 130.0 ml LV SV MOD A2C:     29.0 ml LV  SV MOD A4C:     165.0 ml LV SV MOD BP:      33.8 ml RIGHT VENTRICLE RV Basal diam:  4.60 cm RV Mid diam:    4.20 cm RV S prime:     5.22 cm/s LEFT ATRIUM           Index        RIGHT ATRIUM           Index LA diam:      4.00 cm 2.15 cm/m   RA Area:     10.90 cm LA Vol (A2C):  38.8 ml 20.89 ml/m  RA Volume:   19.80 ml  10.66 ml/m LA Vol (A4C): 70.5 ml 37.96 ml/m  AORTIC VALVE AV Area (Vmax):    2.86 cm AV Area (Vmean):   3.20 cm AV Area (VTI):     4.78 cm AV Vmax:           78.00 cm/s AV Vmean:          49.300 cm/s AV VTI:            0.073 m AV Peak Grad:      2.4 mmHg AV Mean Grad:      1.0 mmHg LVOT Vmax:         71.10 cm/s LVOT Vmean:        50.200 cm/s LVOT VTI:          0.111 m LVOT/AV VTI ratio: 1.52  AORTA Ao Root diam: 3.50 cm MITRAL VALVE                  TRICUSPID VALVE MV Area (PHT): 6.48 cm       TR Peak grad:   34.1 mmHg MV Decel Time: 117 msec       TR Vmax:        292.00 cm/s MR Peak grad:    71.2 mmHg MR Mean grad:    46.0 mmHg    SHUNTS MR Vmax:         422.00 cm/s  Systemic VTI:  0.11 m MR Vmean:        313.0 cm/s   Systemic Diam: 2.00 cm MR PISA:         6.28 cm MR PISA Eff ROA: 52 mm MR PISA Radius:  1.00 cm MV E velocity: 140.00 cm/s Alwyn Pea MD Electronically signed by Alwyn Pea MD Signature Date/Time: 01/02/2023/12:49:30 PM    Final    DG Chest 2 View  Result Date: 01/01/2023 CLINICAL DATA:  Shortness of breath EXAM: CHEST - 2 VIEW COMPARISON:  CXR 06/15/12 FINDINGS: New small bilateral pleural effusions, left greater than right. Hazy bibasilar airspace opacities may represent superimposed atelectasis, but infection is not excluded. Borderline cardiomegaly. No radiographically apparent displaced rib fractures. Visualized upper abdomen is unremarkable. Degenerative changes of the bilateral glenohumeral and AC joints. IMPRESSION: 1. New small bilateral pleural effusions, left greater than right. 2. Hazy bibasilar airspace opacities may represent superimposed atelectasis, but infection is not excluded. Electronically Signed   By: Lorenza Cambridge M.D.   On: 01/01/2023 11:49        Scheduled Meds:  donepezil  10 mg Oral Daily   finasteride  5 mg Oral Daily   memantine  10 mg Oral BID   metoprolol tartrate  12.5 mg Oral TID    pantoprazole  40 mg Oral Daily   tamsulosin  0.4 mg Oral Daily   warfarin  7.5 mg Oral ONCE-1600  Warfarin - Pharmacist Dosing Inpatient   Does not apply q1600   Continuous Infusions:  furosemide (LASIX) 200 mg in dextrose 5 % 100 mL (2 mg/mL) infusion       LOS: 1 day    Time spent: 35 mins     Charise Killian, MD [facility] Pager [phone]  If 7PM-7AM, please contact night-coverage [email or web address] 01/03/2023, 9:36 AM

## 2023-01-03 NOTE — Progress Notes (Signed)
ANTICOAGULATION CONSULT NOTE - Initial Consult  Pharmacy Consult for warfarin Indication: atrial fibrillation  No Known Allergies  Patient Measurements: Height:  (162.6 cm) Weight: 80.3 kg (177 lb) IBW/kg (Calculated) : 59.2  Vital Signs: Temp: 98.5 F (36.9 C) (04/17 1120) Temp Source: Oral (04/17 0440) BP: 114/79 (04/17 1120) Pulse Rate: 79 (04/17 1120)  Labs: Recent Labs    01/01/23 1116 01/01/23 1356 01/01/23 1755 01/02/23 0449 01/03/23 0530  HGB 11.8*  --   --  11.6* 10.8*  HCT 37.6*  --   --  36.8* 33.0*  PLT 268  --   --  254 243  LABPROT  --   --  15.7* 16.9* 17.9*  INR  --   --  1.3* 1.4* 1.5*  CREATININE 1.81*  --   --  1.73* 1.74*  TROPONINIHS 52* 52*  --   --   --      Estimated Creatinine Clearance: 31.8 mL/min (A) (by C-G formula based on SCr of 1.74 mg/dL (H)).   Medical History: Past Medical History:  Diagnosis Date   Diabetes mellitus without complication    Hypertension    Memory loss     Medications:  Warfarin 5 mg Monday-Friday, Warfarin 7.5 mg Saturday and Sunday (40 mg /week).  Assessment: 82 year old male PMH atrial fibrillation on warfarin (Chadsvasc at least 4), T2DM, HTN admitted with CHF exacerbation.    No DDI's identified  Goal of Therapy:  INR 2-3 Monitor platelets by anticoagulation protocol: Yes   Date INR Warfarin dose/comments  4/15 1.3  2.5mg  x 1  (plus  PTA)  4/16 1.4 7.5 mg  4/17 1.5 7.5 mg       Plan: INR remains subtherapeutic, but trending upward. Will order warfarin 7.5mg  x1 dose today and continue daily INR with AM labs.   Bettey Costa, PharmD Clinical Pharmacist 01/03/2023 2:13 PM

## 2023-01-03 NOTE — Progress Notes (Signed)
[facility] CLINIC CARDIOLOGY CONSULT NOTE       Patient ID: Bob Brewer MRN: [id number] DOB/AGE: 81-25-1942 81 y.o.  Admit date: 01/01/2023 Referring Physician Dr Darlin Priestly Primary Physician Kara Dies, NP Primary Cardiologist prev Dr. Beatrix Fetters (last seen 08/2021)  Reason for Consultation CHF  HPI: Bob Brewer is an 31yoM with a PMH of HFmrEF (45-50% 09/2021), chronic LBBB, chronic atrial fibrillation (warfarin), DM2, HTN, memory impairment, who presented to [facility] ED 01/01/2023 with shortness of breath and peripheral edema worsening over 2-3 weeks.  Cardiology is consulted for assistance with his heart failure.  Interval History:  - feels ok today, no chest pain or shortness of breath - still clinically hypervolemic with 2+ LE edema - I/O not accurate  - limited historian (at least moderate memory impairment)  - in AF on tele, rate controlled int he 80s-90s - echo resulted with reduced EF 25-30%, global HK, g2 DD with severe reduced LV function, low normal RV function. Biatrial dilatation, and mild MR   Review of systems complete and found to be negative unless listed above     Past Medical History:  Diagnosis Date   Diabetes mellitus without complication    Hypertension    Memory loss     Past Surgical History:  Procedure Laterality Date   BACK SURGERY  1992   also 1994    Medications Prior to Admission  Medication Sig Dispense Refill Last Dose   cyanocobalamin (VITAMIN B12) 1000 MCG/ML injection Inject 1 ml subcutaneously once a month 3 mL 3 Past Week   donepezil (ARICEPT) 10 MG tablet Take 1 tablet (10 mg total) by mouth daily. 90 tablet 4 01/01/2023   finasteride (PROSCAR) 5 MG tablet TAKE 1 TABLET BY MOUTH  DAILY 90 tablet 3 01/01/2023   furosemide (LASIX) 40 MG tablet TAKE 1 TABLET BY MOUTH DAILY (Patient taking differently: Take 40 mg by mouth 2 (two) times daily.) 100 tablet 2 01/01/2023   lisinopril (ZESTRIL) 40 MG tablet TAKE 1 TABLET BY MOUTH DAILY 100 tablet  1 01/01/2023   meloxicam (MOBIC) 15 MG tablet TAKE 1 TABLET BY MOUTH  DAILY 100 tablet 2 01/01/2023   memantine (NAMENDA) 10 MG tablet Take 1 tablet (10 mg total) by mouth 2 (two) times daily. Please call and make overdue appt for further refills, 1st attempt 180 tablet 4 01/01/2023   metFORMIN (GLUCOPHAGE) 500 MG tablet TAKE 1 TABLET BY MOUTH TWICE  DAILY WITH MEALS 200 tablet 2 01/01/2023   metoprolol succinate (TOPROL-XL) 25 MG 24 hr tablet TAKE 1 TABLET BY MOUTH DAILY 100 tablet 2 01/01/2023   omeprazole (PRILOSEC) 20 MG capsule TAKE 1 CAPSULE BY MOUTH DAILY 100 capsule 2 01/01/2023   pravastatin (PRAVACHOL) 40 MG tablet TAKE 1 TABLET BY MOUTH DAILY 100 tablet 1 01/01/2023   spironolactone (ALDACTONE) 25 MG tablet TAKE 1 TABLET BY MOUTH DAILY 100 tablet 2 01/01/2023   tamsulosin (FLOMAX) 0.4 MG CAPS capsule TAKE 1 CAPSULE BY MOUTH  DAILY 90 capsule 3 01/01/2023   VITAMIN D PO Take 2,000 Units by mouth daily.   Past Month   warfarin (COUMADIN) 2.5 MG tablet Take 1 tablet (2.5 mg total) by mouth 2 (two) times a week. Take 2.5mg  in addition to the 5mg  on Saturday and Sunday 90 tablet 3 12/31/2022   warfarin (COUMADIN) 5 MG tablet Take 1 tablet (5 mg total) by mouth daily. Take 5 mg Monday - Friday 90 tablet 3 01/01/2023   Alcohol Swabs (ALCOHOL PREP) PADS 1 each  by Does not apply route daily. 300 each 3    B-D TB SYRINGE 1CC/27GX1/2" 27G X 1/2" 1 ML MISC USE AS DIRECTED WITH  CYANOCOBALAMIN 4 each 4    Blood Glucose Monitoring Suppl (TRUE METRIX AIR GLUCOSE METER) DEVI Use to check blood sugar 2 times daily 1 each 6    furosemide (LASIX) 20 MG tablet Take 20 mg by mouth daily. (Patient not taking: Reported on 01/01/2023)   Not Taking   glucose blood (RELION TRUE METRIX TEST STRIPS) test strip Check sugar 2 times daily 300 each 3    Lancets 33G MISC Check blood sugar 2 times daily 300 each 3     Social History   Socioeconomic History   Marital status: Married    Spouse name: Bob Brewer   Number of  children: Not on file   Years of education: Not on file   Highest education level: 12th grade  Occupational History   Occupation: Retired  Tobacco Use   Smoking status: Former    Types: Cigarettes   Smokeless tobacco: Never  Substance and Sexual Activity   Alcohol use: Not Currently   Drug use: Never   Sexual activity: Not Currently  Other Topics Concern   Not on file  Social History Narrative   Life with wife   Right handed   Drinks 5-6 cups caffeine daily   Social Determinants of Health   Financial Resource Strain: Low Risk  (08/30/2021)   Overall Financial Resource Strain (CARDIA)    Difficulty of Paying Living Expenses: Not hard at all  Food Insecurity: No Food Insecurity (08/30/2021)   Hunger Vital Sign    Worried About Running Out of Food in the Last Year: Never true    Ran Out of Food in the Last Year: Never true  Transportation Needs: No Transportation Needs (08/30/2021)   PRAPARE - Administrator, Civil Service (Medical): No    Lack of Transportation (Non-Medical): No  Physical Activity: Inactive (08/30/2021)   Exercise Vital Sign    Days of Exercise per Week: 0 days    Minutes of Exercise per Session: 0 min  Stress: No Stress Concern Present (08/30/2021)   [employer] of Occupational Health - Occupational Stress Questionnaire    Feeling of Stress : Not at all  Social Connections: Socially Integrated (08/30/2021)   Social Connection and Isolation Panel [NHANES]    Frequency of Communication with Friends and Family: More than three times a week    Frequency of Social Gatherings with Friends and Family: Twice a week    Attends Religious Services: More than 4 times per year    Active Member of [employer] or Organizations: Yes    Attends Banker Meetings: More than 4 times per year    Marital Status: Married  Catering manager Violence: Not At Risk (08/30/2021)   Humiliation, Afraid, Rape, and Kick questionnaire    Fear of Current or  Ex-Partner: No    Emotionally Abused: No    Physically Abused: No    Sexually Abused: No    History reviewed. No pertinent family history.    Intake/Output Summary (Last 24 hours) at 01/03/2023 0828 Last data filed at 01/03/2023 0600 Gross per 24 hour  Intake 480 ml  Output 600 ml  Net -120 ml    Vitals:   01/02/23 2043 01/03/23 0000 01/03/23 0440 01/03/23 0729  BP: 104/77 (!) 119/109 114/88 107/88  Pulse: 84 91 85 98  Resp: 18 19 18  16  Temp: 97.9 F (36.6 C) 98 F (36.7 C) 97.8 F (36.6 C) 97.8 F (36.6 C)  TempSrc: Oral Oral Oral   SpO2: 94% 99% 100% 100%  Weight:      Height:        PHYSICAL EXAM General: Elderly male, well nourished, in no acute distress. Laying at angle in bed, no family present HEENT:  Normocephalic and atraumatic. Neck:  No JVD.  Lungs: Normal respiratory effort on room air.  Bibasilar crackles without appreciable wheezes.   Heart: irregularly irregular with controled rate. Normal S1 and S2 without gallops or murmurs.  Abdomen: Non-distended appearing.  Msk: Normal strength and tone for age. Extremities: Cool to touch, 2+ bilateral lower extremity with skin dimpling and wrinkling in both feet.  Left shin with 3 discrete chronic appearing ulcers. Covered with bandages today Neuro: Awake and alert, is a limited historian Psych:  Answers questions appropriately.   Labs: Basic Metabolic Panel: Recent Labs    01/02/23 0449 01/03/23 0530  NA 140 136  K 4.4 4.5  CL 107 104  CO2 22 22  GLUCOSE 160* 155*  BUN 42* 40*  CREATININE 1.73* 1.74*  CALCIUM 8.7* 8.7*  MG 1.4* 1.8    Liver Function Tests: No results for input(s): "AST", "ALT", "ALKPHOS", "BILITOT", "PROT", "ALBUMIN" in the last 72 hours. No results for input(s): "LIPASE", "AMYLASE" in the last 72 hours. CBC: Recent Labs    01/02/23 0449 01/03/23 0530  WBC 10.1 8.1  HGB 11.6* 10.8*  HCT 36.8* 33.0*  MCV 95.3 91.7  PLT 254 243    Cardiac Enzymes: Recent Labs     01/01/23 1116 01/01/23 1356  TROPONINIHS 52* 52*    BNP: Recent Labs    01/01/23 1116  BNP 2,466.0*    D-Dimer: No results for input(s): "DDIMER" in the last 72 hours. Hemoglobin A1C: Recent Labs    01/02/23 0449  HGBA1C 7.7*   Fasting Lipid Panel: No results for input(s): "CHOL", "HDL", "LDLCALC", "TRIG", "CHOLHDL", "LDLDIRECT" in the last 72 hours. Thyroid Function Tests: No results for input(s): "TSH", "T4TOTAL", "T3FREE", "THYROIDAB" in the last 72 hours.  Invalid input(s): "FREET3" Anemia Panel: No results for input(s): "VITAMINB12", "FOLATE", "FERRITIN", "TIBC", "IRON", "RETICCTPCT" in the last 72 hours.   Radiology: ECHOCARDIOGRAM COMPLETE  Result Date: 01/02/2023    ECHOCARDIOGRAM REPORT   Patient Name:   JEDD SCHULENBURG Date of Exam: 01/02/2023 Medical Rec #:  [id number]       Height:       64.0 in Accession #:    [id number]      Weight:       177.0 lb Date of Birth:  Jul 29, 1941       BSA:          1.857 m Patient Age:    82 years        BP:           98/63 mmHg Patient Gender: M               HR:           115 bpm. Exam Location:  [facility] Procedure: 2D Echo, Cardiac Doppler, Color Doppler and Strain Analysis Indications:     Dyspnea R06.00  History:         Patient has no prior history of Echocardiogram examinations.                  Risk Factors:Diabetes and Hypertension.  Sonographer:     Cristela Blue  Referring Phys:  [id number] Inetta Fermo LAI Diagnosing Phys: Alwyn Pea MD  Sonographer Comments: Global longitudinal strain was attempted. IMPRESSIONS  1. Left ventricular ejection fraction, by estimation, is 25 to 30%. The left ventricle has severely decreased function. The left ventricle demonstrates global hypokinesis. The left ventricular internal cavity size was mildly to moderately dilated. Left ventricular diastolic parameters are consistent with Grade II diastolic dysfunction (pseudonormalization).  2. Right ventricular systolic function is low normal. The right ventricular  size is mildly enlarged.  3. Left atrial size was mildly dilated.  4. Right atrial size was mildly dilated.  5. The mitral valve is normal in structure. Mild mitral valve regurgitation.  6. The aortic valve was not well visualized. Aortic valve regurgitation is trivial. FINDINGS  Left Ventricle: Left ventricular ejection fraction, by estimation, is 25 to 30%. The left ventricle has severely decreased function. The left ventricle demonstrates global hypokinesis. The left ventricular internal cavity size was mildly to moderately dilated. There is borderline left ventricular hypertrophy. Left ventricular diastolic parameters are consistent with Grade II diastolic dysfunction (pseudonormalization). Right Ventricle: The right ventricular size is mildly enlarged. No increase in right ventricular wall thickness. Right ventricular systolic function is low normal. Left Atrium: Left atrial size was mildly dilated. Right Atrium: Right atrial size was mildly dilated. Pericardium: There is no evidence of pericardial effusion. Mitral Valve: The mitral valve is normal in structure. Mild mitral valve regurgitation. Tricuspid Valve: The tricuspid valve is normal in structure. Tricuspid valve regurgitation is mild. Aortic Valve: The aortic valve was not well visualized. Aortic valve regurgitation is trivial. Aortic valve mean gradient measures 1.0 mmHg. Aortic valve peak gradient measures 2.4 mmHg. Aortic valve area, by VTI measures 4.78 cm. Pulmonic Valve: The pulmonic valve was normal in structure. Pulmonic valve regurgitation is not visualized. Aorta: The ascending aorta was not well visualized. IAS/Shunts: No atrial level shunt detected by color flow Doppler.  LEFT VENTRICLE PLAX 2D LVIDd:         5.00 cm      Diastology LVIDs:         4.40 cm      LV e' medial:    5.44 cm/s LV PW:         1.10 cm      LV E/e' medial:  25.7 LV IVS:        1.30 cm      LV e' lateral:   7.83 cm/s LVOT diam:     2.00 cm      LV E/e' lateral: 17.9  LV SV:         35 LV SV Index:   19 LVOT Area:     3.14 cm                              3D Volume EF: LV Volumes (MOD)            3D EF:        30 % LV vol d, MOD A2C: 147.0 ml LV EDV:       222 ml LV vol d, MOD A4C: 165.0 ml LV ESV:       155 ml LV vol s, MOD A2C: 118.0 ml LV SV:        66 ml LV vol s, MOD A4C: 130.0 ml LV SV MOD A2C:     29.0 ml LV SV MOD A4C:     165.0 ml LV SV  MOD BP:      33.8 ml RIGHT VENTRICLE RV Basal diam:  4.60 cm RV Mid diam:    4.20 cm RV S prime:     5.22 cm/s LEFT ATRIUM           Index        RIGHT ATRIUM           Index LA diam:      4.00 cm 2.15 cm/m   RA Area:     10.90 cm LA Vol (A2C): 38.8 ml 20.89 ml/m  RA Volume:   19.80 ml  10.66 ml/m LA Vol (A4C): 70.5 ml 37.96 ml/m  AORTIC VALVE AV Area (Vmax):    2.86 cm AV Area (Vmean):   3.20 cm AV Area (VTI):     4.78 cm AV Vmax:           78.00 cm/s AV Vmean:          49.300 cm/s AV VTI:            0.073 m AV Peak Grad:      2.4 mmHg AV Mean Grad:      1.0 mmHg LVOT Vmax:         71.10 cm/s LVOT Vmean:        50.200 cm/s LVOT VTI:          0.111 m LVOT/AV VTI ratio: 1.52  AORTA Ao Root diam: 3.50 cm MITRAL VALVE                  TRICUSPID VALVE MV Area (PHT): 6.48 cm       TR Peak grad:   34.1 mmHg MV Decel Time: 117 msec       TR Vmax:        292.00 cm/s MR Peak grad:    71.2 mmHg MR Mean grad:    46.0 mmHg    SHUNTS MR Vmax:         422.00 cm/s  Systemic VTI:  0.11 m MR Vmean:        313.0 cm/s   Systemic Diam: 2.00 cm MR PISA:         6.28 cm MR PISA Eff ROA: 52 mm MR PISA Radius:  1.00 cm MV E velocity: 140.00 cm/s Alwyn Pea MD Electronically signed by Alwyn Pea MD Signature Date/Time: 01/02/2023/12:49:30 PM    Final    DG Chest 2 View  Result Date: 01/01/2023 CLINICAL DATA:  Shortness of breath EXAM: CHEST - 2 VIEW COMPARISON:  CXR 06/15/12 FINDINGS: New small bilateral pleural effusions, left greater than right. Hazy bibasilar airspace opacities may represent superimposed atelectasis, but infection is  not excluded. Borderline cardiomegaly. No radiographically apparent displaced rib fractures. Visualized upper abdomen is unremarkable. Degenerative changes of the bilateral glenohumeral and AC joints. IMPRESSION: 1. New small bilateral pleural effusions, left greater than right. 2. Hazy bibasilar airspace opacities may represent superimposed atelectasis, but infection is not excluded. Electronically Signed   By: Lorenza Cambridge M.D.   On: 01/01/2023 11:49    ECHO 09/22/2021 DOPPLER ECHO and OTHER SPECIAL PROCEDURES                 Aortic: TRIVIAL AR                 No AS                         98.0 cm/sec peak vel  3.8 mmHg peak grad                         2.3 mmHg mean grad         2.5 cm^2 by DOPPLER                 Mitral: MODERATE MR                No MS                         MV Inflow E Vel = 148.0 cm/sec      MV Annulus E'Vel = nm*                         E/E'Ratio = nm*              Tricuspid: MILD TR                    No TS                         269.9 cm/sec peak TR vel   32.2 mmHg peak RV pressure              Pulmonary: TRIVIAL PR                 No PS  _________________________________________________________________________________________  INTERPRETATION  MILD LV SYSTOLIC DYSFUNCTION (See above)  NORMAL RIGHT VENTRICULAR SYSTOLIC FUNCTION  MODERATE VALVULAR REGURGITATION (See above)  NO VALVULAR STENOSIS  AF WITH RVR PREVENTS TOTALLY ACCURATE ASSESSMENT OF EF, BUT PROBABLY EF ~ 45-50%  ABNORMAL SEPTAL MOTION D/T LBBB   TELEMETRY reviewed by me (LT) 01/03/2023 : Atrial fibrillation rate 80s-90s  EKG reviewed by me: AF LBBB rate 108   Data reviewed by me (LT) 01/03/2023: nursing notes, hospitalist progress ntoe last 24h vitals tele labs imaging I/O   Principal Problem:   Dyspnea    ASSESSMENT AND PLAN:  Prince Rome is an 61yoM with a PMH of HFmrEF (45-50% 09/2021), chronic LBBB, chronic atrial fibrillation (warfarin), DM2, HTN, memory impairment, who presented  to [facility] ED 01/01/2023 with shortness of breath and peripheral edema worsening over 2-3 weeks.  Cardiology is consulted for assistance with his heart failure.  # Acute on chronic HFrEF # Chronic LBBB Presents with peripheral edema and dyspnea on exertion worsening for at least a week per the patient's neighbor at bedside.  BNP markedly elevated at 2400 and chest x-ray with bilateral pleural effusions.  Remains clinically hypervolemic on exam today with 2+ pitting lower extremity edema and cool extremities.  Echo with low EF 25-30%. -S/p IV Lasix 60 mg x 1.  - give 40mg  IV lasix x 1 this AM, then start lasix gtt at 8mg /hr  -Continue GDMT with metoprolol.   Consider addition of ACE/ARB/Entresto, SGLT2i, MRA as BP and renal function allow. -Strict I's/O, salt restriction - will need close outpatient follow up for management of his cardiomyopathy. With underlying memory impairment and poor medical literacy, unclear of his candidacy for CRT-D - ? Possible [facility] before discharge   # chronic atrial fibrillation Rate better controlled today in the 80s-90s. In AF on tele.  -Switch metoprolol XL to metoprolol tartrate 12.5 mg every 6 hours for rate control -Continue warfarin per pharmacy protocol for goal INR 2.0-3.0.  INR subtherapeutic on admission at 1.3 -  Reportedly could not afford DOAC in the past, may revisit this if compliance with warfarin remains an issue -Monitor and replenish electrolytes.  Goal K >4, mag >2  # CKD 3 Baseline from 08/2021 was a creatinine of 1.6 and GFR 42.  Creatinine improving with initial diuresis from 1.81--1.73 and current GFR of 39.  Monitor closely with diuresis.  # Demand ischemia Troponin borderline elevated and flat trending at 52, 52, in the absence of chest pain acute decompensated heart failure, this is most consistent with demand/supply mismatch and not ACS   This patient's plan of care was discussed and created with Dr. Juliann Pares and he is in  agreement.  Signed: Rebeca Allegra , PA-C 01/03/2023, 8:28 AM [facility] Cardiology

## 2023-01-04 DIAGNOSIS — I5043 Acute on chronic combined systolic (congestive) and diastolic (congestive) heart failure: Secondary | ICD-10-CM | POA: Diagnosis not present

## 2023-01-04 DIAGNOSIS — F0390 Unspecified dementia without behavioral disturbance: Secondary | ICD-10-CM | POA: Diagnosis not present

## 2023-01-04 DIAGNOSIS — I482 Chronic atrial fibrillation, unspecified: Secondary | ICD-10-CM | POA: Diagnosis not present

## 2023-01-04 LAB — CBC
HCT: 35.1 % — ABNORMAL LOW (ref 39.0–52.0)
Hemoglobin: 11.3 g/dL — ABNORMAL LOW (ref 13.0–17.0)
MCH: 29.5 pg (ref 26.0–34.0)
MCHC: 32.2 g/dL (ref 30.0–36.0)
MCV: 91.6 fL (ref 80.0–100.0)
Platelets: 231 10*3/uL (ref 150–400)
RBC: 3.83 MIL/uL — ABNORMAL LOW (ref 4.22–5.81)
RDW: 13.5 % (ref 11.5–15.5)
WBC: 8.9 10*3/uL (ref 4.0–10.5)
nRBC: 0 % (ref 0.0–0.2)

## 2023-01-04 LAB — BASIC METABOLIC PANEL
Anion gap: 11 (ref 5–15)
BUN: 40 mg/dL — ABNORMAL HIGH (ref 8–23)
CO2: 24 mmol/L (ref 22–32)
Calcium: 8.6 mg/dL — ABNORMAL LOW (ref 8.9–10.3)
Chloride: 103 mmol/L (ref 98–111)
Creatinine, Ser: 1.81 mg/dL — ABNORMAL HIGH (ref 0.61–1.24)
GFR, Estimated: 37 mL/min — ABNORMAL LOW (ref >60)
Glucose, Bld: 162 mg/dL — ABNORMAL HIGH (ref 70–99)
Potassium: 4 mmol/L (ref 3.5–5.1)
Sodium: 138 mmol/L (ref 135–145)

## 2023-01-04 LAB — MAGNESIUM: Magnesium: 1.8 mg/dL (ref 1.7–2.4)

## 2023-01-04 LAB — PROTIME-INR
INR: 1.7 — ABNORMAL HIGH (ref 0.8–1.2)
Prothrombin Time: 19.7 seconds — ABNORMAL HIGH (ref 11.4–15.2)

## 2023-01-04 MED ORDER — WARFARIN SODIUM 7.5 MG PO TABS
7.5000 mg | ORAL_TABLET | Freq: Once | ORAL | Status: AC
  Administered 2023-01-04: 7.5 mg via ORAL
  Filled 2023-01-04: qty 1

## 2023-01-04 NOTE — Progress Notes (Signed)
[facility] CLINIC CARDIOLOGY CONSULT NOTE       Patient ID: Bob Brewer MRN: [id number] DOB/AGE: 81-Dec-1942 81 y.o.  Admit date: 01/01/2023 Referring Physician Dr Darlin Priestly Primary Physician Kara Dies, NP Primary Cardiologist prev Dr. Beatrix Fetters (last seen 08/2021)  Reason for Consultation CHF  HPI: Rafik Koppel is an 69yoM with a PMH of HFmrEF (45-50% 09/2021), chronic LBBB, chronic atrial fibrillation (warfarin), DM2, HTN, memory impairment, who presented to [facility] ED 01/01/2023 with shortness of breath and peripheral edema worsening over 2-3 weeks.  Cardiology is consulted for assistance with his heart failure. Diuresing well and clinically improving on a lasix gtt.   Interval History:  - diuresing well. I/O likely inaccurate from initial hospital day. Weight down from 177lbs -- 165lbs.  - renal function stable  - no chest pain or shortness of breath, peripheral edema improving but remains significant - no tele this AM - has not yet been out of bed    Review of systems complete and found to be negative unless listed above     Past Medical History:  Diagnosis Date   Diabetes mellitus without complication    Hypertension    Memory loss     Past Surgical History:  Procedure Laterality Date   BACK SURGERY  1992   also 1994    Medications Prior to Admission  Medication Sig Dispense Refill Last Dose   cyanocobalamin (VITAMIN B12) 1000 MCG/ML injection Inject 1 ml subcutaneously once a month 3 mL 3 Past Week   donepezil (ARICEPT) 10 MG tablet Take 1 tablet (10 mg total) by mouth daily. 90 tablet 4 01/01/2023   finasteride (PROSCAR) 5 MG tablet TAKE 1 TABLET BY MOUTH  DAILY 90 tablet 3 01/01/2023   furosemide (LASIX) 40 MG tablet TAKE 1 TABLET BY MOUTH DAILY (Patient taking differently: Take 40 mg by mouth 2 (two) times daily.) 100 tablet 2 01/01/2023   lisinopril (ZESTRIL) 40 MG tablet TAKE 1 TABLET BY MOUTH DAILY 100 tablet 1 01/01/2023   meloxicam (MOBIC) 15 MG tablet TAKE 1  TABLET BY MOUTH  DAILY 100 tablet 2 01/01/2023   memantine (NAMENDA) 10 MG tablet Take 1 tablet (10 mg total) by mouth 2 (two) times daily. Please call and make overdue appt for further refills, 1st attempt 180 tablet 4 01/01/2023   metFORMIN (GLUCOPHAGE) 500 MG tablet TAKE 1 TABLET BY MOUTH TWICE  DAILY WITH MEALS 200 tablet 2 01/01/2023   metoprolol succinate (TOPROL-XL) 25 MG 24 hr tablet TAKE 1 TABLET BY MOUTH DAILY 100 tablet 2 01/01/2023   omeprazole (PRILOSEC) 20 MG capsule TAKE 1 CAPSULE BY MOUTH DAILY 100 capsule 2 01/01/2023   pravastatin (PRAVACHOL) 40 MG tablet TAKE 1 TABLET BY MOUTH DAILY 100 tablet 1 01/01/2023   spironolactone (ALDACTONE) 25 MG tablet TAKE 1 TABLET BY MOUTH DAILY 100 tablet 2 01/01/2023   tamsulosin (FLOMAX) 0.4 MG CAPS capsule TAKE 1 CAPSULE BY MOUTH  DAILY 90 capsule 3 01/01/2023   VITAMIN D PO Take 2,000 Units by mouth daily.   Past Month   warfarin (COUMADIN) 2.5 MG tablet Take 1 tablet (2.5 mg total) by mouth 2 (two) times a week. Take 2.5mg  in addition to the 5mg  on Saturday and Sunday 90 tablet 3 12/31/2022   warfarin (COUMADIN) 5 MG tablet Take 1 tablet (5 mg total) by mouth daily. Take 5 mg Monday - Friday 90 tablet 3 01/01/2023   Alcohol Swabs (ALCOHOL PREP) PADS 1 each by Does not apply route daily. 300 each  3    B-D TB SYRINGE 1CC/27GX1/2" 27G X 1/2" 1 ML MISC USE AS DIRECTED WITH  CYANOCOBALAMIN 4 each 4    Blood Glucose Monitoring Suppl (TRUE METRIX AIR GLUCOSE METER) DEVI Use to check blood sugar 2 times daily 1 each 6    furosemide (LASIX) 20 MG tablet Take 20 mg by mouth daily. (Patient not taking: Reported on 01/01/2023)   Not Taking   glucose blood (RELION TRUE METRIX TEST STRIPS) test strip Check sugar 2 times daily 300 each 3    Lancets 33G MISC Check blood sugar 2 times daily 300 each 3     Social History   Socioeconomic History   Marital status: Married    Spouse name: Eber Jones   Number of children: Not on file   Years of education: Not on file    Highest education level: 12th grade  Occupational History   Occupation: Retired  Tobacco Use   Smoking status: Former    Types: Cigarettes   Smokeless tobacco: Never  Substance and Sexual Activity   Alcohol use: Not Currently   Drug use: Never   Sexual activity: Not Currently  Other Topics Concern   Not on file  Social History Narrative   Life with wife   Right handed   Drinks 5-6 cups caffeine daily   Social Determinants of Health   Financial Resource Strain: Low Risk  (08/30/2021)   Overall Financial Resource Strain (CARDIA)    Difficulty of Paying Living Expenses: Not hard at all  Food Insecurity: No Food Insecurity (08/30/2021)   Hunger Vital Sign    Worried About Running Out of Food in the Last Year: Never true    Ran Out of Food in the Last Year: Never true  Transportation Needs: No Transportation Needs (08/30/2021)   PRAPARE - Administrator, Civil Service (Medical): No    Lack of Transportation (Non-Medical): No  Physical Activity: Inactive (08/30/2021)   Exercise Vital Sign    Days of Exercise per Week: 0 days    Minutes of Exercise per Session: 0 min  Stress: No Stress Concern Present (08/30/2021)   [employer] of Occupational Health - Occupational Stress Questionnaire    Feeling of Stress : Not at all  Social Connections: Socially Integrated (08/30/2021)   Social Connection and Isolation Panel [NHANES]    Frequency of Communication with Friends and Family: More than three times a week    Frequency of Social Gatherings with Friends and Family: Twice a week    Attends Religious Services: More than 4 times per year    Active Member of [employer] or Organizations: Yes    Attends Banker Meetings: More than 4 times per year    Marital Status: Married  Catering manager Violence: Not At Risk (08/30/2021)   Humiliation, Afraid, Rape, and Kick questionnaire    Fear of Current or Ex-Partner: No    Emotionally Abused: No    Physically Abused:  No    Sexually Abused: No    History reviewed. No pertinent family history.    Intake/Output Summary (Last 24 hours) at 01/04/2023 0813 Last data filed at 01/04/2023 0547 Gross per 24 hour  Intake 146.02 ml  Output 2100 ml  Net -1953.98 ml     Vitals:   01/03/23 1946 01/03/23 2310 01/04/23 0450 01/04/23 0755  BP: (!) 101/57 109/85 (!) 105/92 110/79  Pulse: (!) 58 97 95 (!) 104  Resp: 18 20 18 18   Temp: 97.8 F (36.6  C) 98.1 F (36.7 C) 98 F (36.7 C) 97.7 F (36.5 C)  TempSrc:    Oral  SpO2: 100% 100% 100% 99%  Weight:      Height:        PHYSICAL EXAM General: Elderly male, well nourished, in no acute distress. Laying at angle in bed, no family present HEENT:  Normocephalic and atraumatic. Neck:  No JVD.  Lungs: Normal respiratory effort on 2L [place]  Bibasilar crackles without appreciable wheezes.   Heart: irregularly irregular with controled rate. Normal S1 and S2 without gallops or murmurs.  Abdomen: Non-distended appearing.  Msk: Normal strength and tone for age. Extremities: warm to touch, 1-2+ bilateral lower extremity with skin dimpling and wrinkling in both feet with overall improvement.  Left shin with 3 discrete chronic appearing ulcers - remain covered with bandages  Neuro: Awake and alert, is a limited historian Psych:  Answers questions appropriately.   Labs: Basic Metabolic Panel: Recent Labs    01/03/23 0530 01/04/23 0518  NA 136 138  K 4.5 4.0  CL 104 103  CO2 22 24  GLUCOSE 155* 162*  BUN 40* 40*  CREATININE 1.74* 1.81*  CALCIUM 8.7* 8.6*  MG 1.8 1.8    Liver Function Tests: No results for input(s): "AST", "ALT", "ALKPHOS", "BILITOT", "PROT", "ALBUMIN" in the last 72 hours. No results for input(s): "LIPASE", "AMYLASE" in the last 72 hours. CBC: Recent Labs    01/03/23 0530 01/04/23 0518  WBC 8.1 8.9  HGB 10.8* 11.3*  HCT 33.0* 35.1*  MCV 91.7 91.6  PLT 243 231    Cardiac Enzymes: Recent Labs    01/01/23 1116 01/01/23 1356   TROPONINIHS 52* 52*    BNP: Recent Labs    01/01/23 1116  BNP 2,466.0*    D-Dimer: No results for input(s): "DDIMER" in the last 72 hours. Hemoglobin A1C: Recent Labs    01/02/23 0449  HGBA1C 7.7*    Fasting Lipid Panel: No results for input(s): "CHOL", "HDL", "LDLCALC", "TRIG", "CHOLHDL", "LDLDIRECT" in the last 72 hours. Thyroid Function Tests: No results for input(s): "TSH", "T4TOTAL", "T3FREE", "THYROIDAB" in the last 72 hours.  Invalid input(s): "FREET3" Anemia Panel: No results for input(s): "VITAMINB12", "FOLATE", "FERRITIN", "TIBC", "IRON", "RETICCTPCT" in the last 72 hours.   Radiology: ECHOCARDIOGRAM COMPLETE  Result Date: 01/02/2023    ECHOCARDIOGRAM REPORT   Patient Name:   CY BRESEE Date of Exam: 01/02/2023 Medical Rec #:  [id number]       Height:       64.0 in Accession #:    [id number]      Weight:       177.0 lb Date of Birth:  1941/03/01       BSA:          1.857 m Patient Age:    82 years        BP:           98/63 mmHg Patient Gender: M               HR:           115 bpm. Exam Location:  [facility] Procedure: 2D Echo, Cardiac Doppler, Color Doppler and Strain Analysis Indications:     Dyspnea R06.00  History:         Patient has no prior history of Echocardiogram examinations.                  Risk Factors:Diabetes and Hypertension.  Sonographer:  Cristela Blue Referring Phys:  [id number] TINA LAI Diagnosing Phys: Alwyn Pea MD  Sonographer Comments: Global longitudinal strain was attempted. IMPRESSIONS  1. Left ventricular ejection fraction, by estimation, is 25 to 30%. The left ventricle has severely decreased function. The left ventricle demonstrates global hypokinesis. The left ventricular internal cavity size was mildly to moderately dilated. Left ventricular diastolic parameters are consistent with Grade II diastolic dysfunction (pseudonormalization).  2. Right ventricular systolic function is low normal. The right ventricular size is mildly enlarged.  3.  Left atrial size was mildly dilated.  4. Right atrial size was mildly dilated.  5. The mitral valve is normal in structure. Mild mitral valve regurgitation.  6. The aortic valve was not well visualized. Aortic valve regurgitation is trivial. FINDINGS  Left Ventricle: Left ventricular ejection fraction, by estimation, is 25 to 30%. The left ventricle has severely decreased function. The left ventricle demonstrates global hypokinesis. The left ventricular internal cavity size was mildly to moderately dilated. There is borderline left ventricular hypertrophy. Left ventricular diastolic parameters are consistent with Grade II diastolic dysfunction (pseudonormalization). Right Ventricle: The right ventricular size is mildly enlarged. No increase in right ventricular wall thickness. Right ventricular systolic function is low normal. Left Atrium: Left atrial size was mildly dilated. Right Atrium: Right atrial size was mildly dilated. Pericardium: There is no evidence of pericardial effusion. Mitral Valve: The mitral valve is normal in structure. Mild mitral valve regurgitation. Tricuspid Valve: The tricuspid valve is normal in structure. Tricuspid valve regurgitation is mild. Aortic Valve: The aortic valve was not well visualized. Aortic valve regurgitation is trivial. Aortic valve mean gradient measures 1.0 mmHg. Aortic valve peak gradient measures 2.4 mmHg. Aortic valve area, by VTI measures 4.78 cm. Pulmonic Valve: The pulmonic valve was normal in structure. Pulmonic valve regurgitation is not visualized. Aorta: The ascending aorta was not well visualized. IAS/Shunts: No atrial level shunt detected by color flow Doppler.  LEFT VENTRICLE PLAX 2D LVIDd:         5.00 cm      Diastology LVIDs:         4.40 cm      LV e' medial:    5.44 cm/s LV PW:         1.10 cm      LV E/e' medial:  25.7 LV IVS:        1.30 cm      LV e' lateral:   7.83 cm/s LVOT diam:     2.00 cm      LV E/e' lateral: 17.9 LV SV:         35 LV SV Index:    19 LVOT Area:     3.14 cm                              3D Volume EF: LV Volumes (MOD)            3D EF:        30 % LV vol d, MOD A2C: 147.0 ml LV EDV:       222 ml LV vol d, MOD A4C: 165.0 ml LV ESV:       155 ml LV vol s, MOD A2C: 118.0 ml LV SV:        66 ml LV vol s, MOD A4C: 130.0 ml LV SV MOD A2C:     29.0 ml LV SV MOD A4C:     165.0 ml  LV SV MOD BP:      33.8 ml RIGHT VENTRICLE RV Basal diam:  4.60 cm RV Mid diam:    4.20 cm RV S prime:     5.22 cm/s LEFT ATRIUM           Index        RIGHT ATRIUM           Index LA diam:      4.00 cm 2.15 cm/m   RA Area:     10.90 cm LA Vol (A2C): 38.8 ml 20.89 ml/m  RA Volume:   19.80 ml  10.66 ml/m LA Vol (A4C): 70.5 ml 37.96 ml/m  AORTIC VALVE AV Area (Vmax):    2.86 cm AV Area (Vmean):   3.20 cm AV Area (VTI):     4.78 cm AV Vmax:           78.00 cm/s AV Vmean:          49.300 cm/s AV VTI:            0.073 m AV Peak Grad:      2.4 mmHg AV Mean Grad:      1.0 mmHg LVOT Vmax:         71.10 cm/s LVOT Vmean:        50.200 cm/s LVOT VTI:          0.111 m LVOT/AV VTI ratio: 1.52  AORTA Ao Root diam: 3.50 cm MITRAL VALVE                  TRICUSPID VALVE MV Area (PHT): 6.48 cm       TR Peak grad:   34.1 mmHg MV Decel Time: 117 msec       TR Vmax:        292.00 cm/s MR Peak grad:    71.2 mmHg MR Mean grad:    46.0 mmHg    SHUNTS MR Vmax:         422.00 cm/s  Systemic VTI:  0.11 m MR Vmean:        313.0 cm/s   Systemic Diam: 2.00 cm MR PISA:         6.28 cm MR PISA Eff ROA: 52 mm MR PISA Radius:  1.00 cm MV E velocity: 140.00 cm/s Alwyn Pea MD Electronically signed by Alwyn Pea MD Signature Date/Time: 01/02/2023/12:49:30 PM    Final    DG Chest 2 View  Result Date: 01/01/2023 CLINICAL DATA:  Shortness of breath EXAM: CHEST - 2 VIEW COMPARISON:  CXR 06/15/12 FINDINGS: New small bilateral pleural effusions, left greater than right. Hazy bibasilar airspace opacities may represent superimposed atelectasis, but infection is not excluded. Borderline  cardiomegaly. No radiographically apparent displaced rib fractures. Visualized upper abdomen is unremarkable. Degenerative changes of the bilateral glenohumeral and AC joints. IMPRESSION: 1. New small bilateral pleural effusions, left greater than right. 2. Hazy bibasilar airspace opacities may represent superimposed atelectasis, but infection is not excluded. Electronically Signed   By: Lorenza Cambridge M.D.   On: 01/01/2023 11:49    ECHO 09/22/2021 DOPPLER ECHO and OTHER SPECIAL PROCEDURES                 Aortic: TRIVIAL AR                 No AS                         98.0 cm/sec peak  vel       3.8 mmHg peak grad                         2.3 mmHg mean grad         2.5 cm^2 by DOPPLER                 Mitral: MODERATE MR                No MS                         MV Inflow E Vel = 148.0 cm/sec      MV Annulus E'Vel = nm*                         E/E'Ratio = nm*              Tricuspid: MILD TR                    No TS                         269.9 cm/sec peak TR vel   32.2 mmHg peak RV pressure              Pulmonary: TRIVIAL PR                 No PS  _________________________________________________________________________________________  INTERPRETATION  MILD LV SYSTOLIC DYSFUNCTION (See above)  NORMAL RIGHT VENTRICULAR SYSTOLIC FUNCTION  MODERATE VALVULAR REGURGITATION (See above)  NO VALVULAR STENOSIS  AF WITH RVR PREVENTS TOTALLY ACCURATE ASSESSMENT OF EF, BUT PROBABLY EF ~ 45-50%  ABNORMAL SEPTAL MOTION D/T LBBB   TELEMETRY reviewed by me (LT) 01/04/2023 : none available  EKG reviewed by me: AF LBBB rate 108   Data reviewed by me (LT) 01/04/2023: nursing notes, hospitalist progress ntoe last 24h vitals tele labs imaging I/O   Principal Problem:   Dyspnea    ASSESSMENT AND PLAN:  Prince Rome is an 4yoM with a PMH of HFmrEF (45-50% 09/2021), chronic LBBB, chronic atrial fibrillation (warfarin), DM2, HTN, memory impairment, who presented to [facility] ED 01/01/2023 with shortness of  breath and peripheral edema worsening over 2-3 weeks.  Cardiology is consulted for assistance with his heart failure.  # Acute on chronic HFrEF # Chronic LBBB Presents with peripheral edema and dyspnea on exertion worsening for at least a week per the patient's neighbor at bedside.  BNP markedly elevated at 2400 and chest x-ray with bilateral pleural effusions.  Echo with low EF 25-30%. Remains clinically hypervolemic but with overall improvement since admission, weight down 12lbs since admission  -S/p IV Lasix 60 mg x 1 and 40 IV x 2 - increase lasix gtt from /hr  to /hr -Continue GDMT with metoprolol.   Consider addition of ACE/ARB/Entresto, SGLT2i, MRA as BP and renal function allow. -Strict I's/O, salt restriction - will need close outpatient follow up for management of his cardiomyopathy. With underlying memory impairment and poor medical literacy, unclear of his candidacy for CRT-D - ? Possible [facility] before discharge  - anticipate 1-2 more days of diuresis before discharge   # chronic atrial fibrillation -continuous tele while inpatient  -continue metoprolol tartrate 12.5 mg every 6 hours for rate control -Continue warfarin per pharmacy protocol for goal INR 2.0-3.0.  INR subtherapeutic on admission at  1.3 -Reportedly could not afford DOAC in the past -Monitor and replenish electrolytes.  Goal K >4, mag >2  # CKD 3 Baseline from 08/2021 was a creatinine of 1.6 and GFR 42.  Creatinine stable with diuresis from 1.81--1.73 --1.74 -- 1.81 and current GFR of 37.  Monitor closely.  # Demand ischemia Troponin borderline elevated and flat trending at 52, 52, in the absence of chest pain acute decompensated heart failure, this is most consistent with demand/supply mismatch and not ACS   This patient's plan of care was discussed and created with Dr. Darrold Junker and he is in agreement.  Signed: Rebeca Allegra , PA-C 01/04/2023, 8:13 AM [facility]

## 2023-01-04 NOTE — Evaluation (Signed)
Physical Therapy Evaluation Patient Details Name: Bob Brewer MRN: [id number] DOB: May 20, 1941 Today's Date: 01/04/2023  History of Present Illness  Pt is an 82 y.o. male presenting to hospital 01/01/23 with c/o SOB (worsening past week); worse with exertion and laying flat; swelling noted to LE's.  Pt admitted with acute CHF exacerbation.  PMH includes chronic a-fib on warfarin, mild dementia, DM, htn, back surgery, memory loss, chronic LBBB.  Clinical Impression  Prior to hospital admission, pt was modified independent ambulating with [facility]; lives with his wife in 1 level home (level entry).  Currently pt is modified independent semi-supine to sitting edge of bed; CGA with transfers; and CGA to ambulate 15 feet with RW use.  Limited distance ambulating d/t pt c/o 7/10 L foot pain in standing/walking (pt reporting he first noted L foot pain bottom/mid foot earlier this afternoon when he was walking but has no pain otherwise/at rest; no pain to palpation plantar surface L foot)--nurse notified.  Pt would currently benefit from skilled PT to address noted impairments and functional limitations (see below for any additional details).  Upon hospital discharge, pt would benefit from ongoing therapy.    Recommendations for follow up therapy are one component of a multi-disciplinary discharge planning process, led by the attending physician.  Recommendations may be updated based on patient status, additional functional criteria and insurance authorization.        Assistance Recommended at Discharge Intermittent Supervision/Assistance  Patient can return home with the following  A little help with walking and/or transfers;A little help with bathing/dressing/bathroom;Assistance with cooking/housework;Assist for transportation;Help with stairs or ramp for entrance    Equipment Recommendations Rolling walker (2 wheels)  Recommendations for Other Services       Functional Status Assessment Patient has  had a recent decline in their functional status and demonstrates the ability to make significant improvements in function in a reasonable and predictable amount of time.     Precautions / Restrictions Precautions Precautions: Fall Restrictions Weight Bearing Restrictions: No      Mobility  Bed Mobility Overal bed mobility: Modified Independent             General bed mobility comments: Semi-supine to sitting without any noted difficulties    Transfers Overall transfer level: Needs assistance Equipment used: Rolling walker (2 wheels) Transfers: Sit to/from Stand Sit to Stand: Min guard           General transfer comment: fairly strong stand up from bed to RW    Ambulation/Gait Ambulation/Gait assistance: Min guard Gait Distance (Feet): 15 Feet Assistive device: Rolling walker (2 wheels)   Gait velocity: decreased     General Gait Details: mild decreased stance time L LE (d/t L foot pain); steady mostly step through gait pattern  Stairs            Wheelchair Mobility    Modified Rankin (Stroke Patients Only)       Balance Overall balance assessment: Needs assistance Sitting-balance support: No upper extremity supported, Feet supported Sitting balance-Leahy Scale: Normal Sitting balance - Comments: steady sitting reaching outside BOS   Standing balance support: Bilateral upper extremity supported, During functional activity, Reliant on assistive device for balance Standing balance-Leahy Scale: Good Standing balance comment: steady ambulating with RW use                             Pertinent Vitals/Pain Pain Assessment Pain Assessment: 0-10 Pain Score: 0-No pain Pain Location:  L foot (bottom middle) Pain Descriptors / Indicators: Tender, Sore Pain Intervention(s): Limited activity within patient's tolerance, Monitored during session, Repositioned, Other (comment) (RN notified) Vitals (HR and O2 on 2 L via nasal cannula) stable and  WFL throughout treatment session.    Home Living Family/patient expects to be discharged to:: Private residence Living Arrangements: Spouse/significant other Available Help at Discharge: Family;Available PRN/intermittently Type of Home: House Home Access: Level entry       Home Layout: One level Home Equipment: Grab bars - tub/shower;Cane - single point      Prior Function Prior Level of Function : Independent/Modified Independent             Mobility Comments: Modified independent ambulating with SPC; no recent falls. ADLs Comments: Independent     Hand Dominance   Dominant Hand: Right    Extremity/Trunk Assessment   Upper Extremity Assessment Upper Extremity Assessment: Defer to OT evaluation    Lower Extremity Assessment Lower Extremity Assessment: Generalized weakness    Cervical / Trunk Assessment Cervical / Trunk Assessment: Normal  Communication   Communication: HOH  Cognition Arousal/Alertness: Awake/alert Behavior During Therapy: WFL for tasks assessed/performed Overall Cognitive Status: Within Functional Limits for tasks assessed                                 General Comments: Pt HOH and requiring information repeated at times.        General Comments  Pt agreeable to PT session.    Exercises     Assessment/Plan    PT Assessment Patient needs continued PT services  PT Problem List Decreased strength;Decreased activity tolerance;Decreased balance;Decreased mobility;Decreased knowledge of use of DME;Decreased knowledge of precautions;Pain       PT Treatment Interventions DME instruction;Gait training;Functional mobility training;Therapeutic activities;Therapeutic exercise;Balance training;Patient/family education    PT Goals (Current goals can be found in the Care Plan section)  Acute Rehab PT Goals Patient Stated Goal: to improve mobility PT Goal Formulation: With patient Time For Goal Achievement: 01/18/23 Potential to  Achieve Goals: Good    Frequency Min 2X/week     Co-evaluation               AM-PAC PT "6 Clicks" Mobility  Outcome Measure Help needed turning from your back to your side while in a flat bed without using bedrails?: None Help needed moving from lying on your back to sitting on the side of a flat bed without using bedrails?: None Help needed moving to and from a bed to a chair (including a wheelchair)?: A Little Help needed standing up from a chair using your arms (e.g., wheelchair or bedside chair)?: A Little Help needed to walk in hospital room?: A Little Help needed climbing 3-5 steps with a railing? : A Little 6 Click Score: 20    End of Session Equipment Utilized During Treatment: Gait belt;Oxygen (2 L O2 via nasal cannula) Activity Tolerance: Patient limited by pain Patient left: in chair;with call bell/phone within reach;with chair alarm set Nurse Communication: Mobility status;Precautions;Other (comment) (Pt's L foot pain) PT Visit Diagnosis: Other abnormalities of gait and mobility (R26.89);Muscle weakness (generalized) (M62.81);Pain Pain - Right/Left: Left Pain - part of body: Ankle and joints of foot    Time: 6045-4098 PT Time Calculation (min) (ACUTE ONLY): 32 min   Charges:   PT Evaluation $PT Eval Low Complexity: 1 Low PT Treatments $Therapeutic Activity: 8-22 mins  Hendricks Limes, PT 01/04/23, 3:46 PM

## 2023-01-04 NOTE — Progress Notes (Signed)
ANTICOAGULATION CONSULT NOTE - Initial Consult  Pharmacy Consult for warfarin Indication: atrial fibrillation  No Known Allergies  Patient Measurements: Height:  (162.6 cm) Weight: 74.9 kg (165 lb 2 oz) IBW/kg (Calculated) : 59.2  Vital Signs: Temp: 97.7 F (36.5 C) (04/18 1217) Temp Source: Oral (04/18 1217) BP: 122/108 (04/18 1217) Pulse Rate: 63 (04/18 1217)  Labs: Recent Labs    01/01/23 1356 01/01/23 1755 01/02/23 0449 01/03/23 0530 01/04/23 0518  HGB  --    < > 11.6* 10.8* 11.3*  HCT  --   --  36.8* 33.0* 35.1*  PLT  --   --  254 243 231  LABPROT  --    < > 16.9* 17.9* 19.7*  INR  --    < > 1.4* 1.5* 1.7*  CREATININE  --   --  1.73* 1.74* 1.81*  TROPONINIHS 52*  --   --   --   --    < > = values in this interval not displayed.     Estimated Creatinine Clearance: 29.7 mL/min (A) (by C-G formula based on SCr of 1.81 mg/dL (H)).   Medical History: Past Medical History:  Diagnosis Date   Diabetes mellitus without complication    Hypertension    Memory loss     Medications:  Warfarin 5 mg Monday-Friday, Warfarin 7.5 mg Saturday and Sunday (40 mg /week).  Assessment: 82 year old male PMH atrial fibrillation on warfarin (Chadsvasc at least 4), T2DM, HTN admitted with CHF exacerbation.    No DDI's identified  Goal of Therapy:  INR 2-3 Monitor platelets by anticoagulation protocol: Yes   Date INR Warfarin dose/comments  4/15 1.3  2.5mg  x 1  (plus  PTA)  4/16 1.4 7.5 mg  4/17 1.5 7.5 mg  4/18 1.7 7.5 mg   Plan: INR remains subtherapeutic, but trending upward. Will order warfarin 7.5mg  x1 dose today and continue daily INR with AM labs.   Bettey Costa, PharmD Clinical Pharmacist 01/04/2023 1:34 PM

## 2023-01-04 NOTE — Evaluation (Signed)
Occupational Therapy Evaluation Patient Details Name: Bob Brewer MRN: [id number] DOB: 06-May-1941 Today's Date: 01/04/2023   History of Present Illness Pt is an 82 y.o. male presenting to hospital 01/01/23 with c/o SOB (worsening past week); worse with exertion and laying flat; swelling noted to LE's.  Pt admitted with acute CHF exacerbation.  PMH includes chronic a-fib on warfarin, mild dementia, DM, htn, back surgery, memory loss, chronic LBBB.   Clinical Impression   Patient presenting with decreased Ind in self care,balance, functional mobility/transfers, endurance, and safety awareness. Patient reports living at home with wife and being Ind with self care needs. She assists with IADLs. Upon entering the room, pt found lying in bed and having been incontinent of BM with soiled linen sheet, soiled clothing, and soiled external catheter. Pt stands with min A and pushes IV pole with min guard into bathroom for toileting and hygiene needs. Pt continues to be incontinent of bowel while walking to bathroom and unaware. Pt seated on commode to empty and needing assistance with thoroughness of hygiene. NT changes bed linens while therapist assists with self care and pt returns to bed at end of session. OT is unsure if pt's wife is able to assist pt in the same manner therapist did during session as she is not present and pt reports she is sick at home. If she is able to assist, pt could likely return home. Patient will benefit from acute OT to increase overall independence in the areas of ADLs, functional mobility, and safety awareness.     Recommendations for follow up therapy are one component of a multi-disciplinary discharge planning process, led by the attending physician.  Recommendations may be updated based on patient status, additional functional criteria and insurance authorization.   Assistance Recommended at Discharge Frequent or constant Supervision/Assistance  Patient can return home with  the following A little help with walking and/or transfers;A lot of help with bathing/dressing/bathroom;Assistance with cooking/housework;Assist for transportation;Help with stairs or ramp for entrance    Functional Status Assessment  Patient has had a recent decline in their functional status and demonstrates the ability to make significant improvements in function in a reasonable and predictable amount of time.  Equipment Recommendations  BSC/3in1       Precautions / Restrictions Precautions Precautions: Fall Restrictions Weight Bearing Restrictions: No      Mobility Bed Mobility Overal bed mobility: Modified Independent                  Transfers Overall transfer level: Needs assistance Equipment used: 1 person hand held assist Transfers: Sit to/from Stand Sit to Stand: Min assist                  Balance Overall balance assessment: Needs assistance Sitting-balance support: No upper extremity supported, Feet supported Sitting balance-Leahy Scale: Normal Sitting balance - Comments: steady sitting reaching outside BOS   Standing balance support: Bilateral upper extremity supported, During functional activity, Reliant on assistive device for balance Standing balance-Leahy Scale: Good Standing balance comment: steady ambulating with RW use                           ADL either performed or assessed with clinical judgement   ADL Overall ADL's : Needs assistance/impaired     Grooming: Wash/dry hands;Standing;Min guard                   Toilet Transfer: Minimal assistance;Ambulation;Comfort height toilet  Toileting- Clothing Manipulation and Hygiene: Moderate assistance;Sit to/from stand       Functional mobility during ADLs: Minimal assistance;Rolling walker (2 wheels)       Vision Patient Visual Report: No change from baseline              Pertinent Vitals/Pain Pain Assessment Pain Assessment: 0-10 Pain Score: 2  Pain  Location: L foot (bottom middle) Pain Descriptors / Indicators: Tender, Sore Pain Intervention(s): Monitored during session, Repositioned     Hand Dominance Right   Extremity/Trunk Assessment Upper Extremity Assessment Upper Extremity Assessment: Generalized weakness   Lower Extremity Assessment Lower Extremity Assessment: Generalized weakness   Cervical / Trunk Assessment Cervical / Trunk Assessment: Normal   Communication Communication Communication: HOH   Cognition Arousal/Alertness: Awake/alert Behavior During Therapy: WFL for tasks assessed/performed Overall Cognitive Status: Impaired/Different from baseline                                 General Comments: Some confusion during task needing cuing/redirection for safety                Home Living Family/patient expects to be discharged to:: Private residence Living Arrangements: Spouse/significant other Available Help at Discharge: Family;Available PRN/intermittently Type of Home: House Home Access: Level entry     Home Layout: One level     Bathroom Shower/Tub: Producer, television/film/video: Standard     Home Equipment: Grab bars - tub/shower;Cane - single point          Prior Functioning/Environment Prior Level of Function : Independent/Modified Independent             Mobility Comments: Modified independent ambulating with SPC; no recent falls. ADLs Comments: Independent        OT Problem List: Decreased strength;Decreased activity tolerance;Decreased safety awareness;Impaired balance (sitting and/or standing);Decreased knowledge of use of DME or AE      OT Treatment/Interventions: Self-care/ADL training;Therapeutic exercise;Therapeutic activities;DME and/or AE instruction;Patient/family education;Balance training;Energy conservation    OT Goals(Current goals can be found in the care plan section) Acute Rehab OT Goals Patient Stated Goal: to go home OT Goal  Formulation: With patient Time For Goal Achievement: 01/18/23 Potential to Achieve Goals: Fair ADL Goals Pt Will Perform Grooming: with modified independence;standing Pt Will Perform Lower Body Dressing: with supervision;sit to/from stand Pt Will Transfer to Toilet: with supervision;ambulating Pt Will Perform Toileting - Clothing Manipulation and hygiene: with supervision;sit to/from stand  OT Frequency: Min 2X/week       AM-PAC OT "6 Clicks" Daily Activity     Outcome Measure Help from another person eating meals?: None Help from another person taking care of personal grooming?: A Little Help from another person toileting, which includes using toliet, bedpan, or urinal?: A Lot Help from another person bathing (including washing, rinsing, drying)?: A Lot Help from another person to put on and taking off regular upper body clothing?: A Little Help from another person to put on and taking off regular lower body clothing?: A Lot 6 Click Score: 16   End of Session Nurse Communication: Mobility status  Activity Tolerance: Patient tolerated treatment well Patient left: in bed;with call bell/phone within reach;with bed alarm set;with nursing/sitter in room  OT Visit Diagnosis: Unsteadiness on feet (R26.81);Repeated falls (R29.6);Muscle weakness (generalized) (M62.81)                Time: 1610-9604 OT Time Calculation (min): 25 min Charges:  OT General Charges $OT Visit: 1 Visit OT Evaluation $OT Eval Moderate Complexity: 1 Mod OT Treatments $Self Care/Home Management : 8-22 mins Jackquline Denmark, MS, OTR/L , CBIS ascom [phone]  01/04/23, 3:44 PM

## 2023-01-04 NOTE — Progress Notes (Signed)
PROGRESS NOTE    Bob Brewer  WUJ:[id number] DOB: 06/08/1941 DOA: 01/01/2023 PCP: Bob Downs, MD   Assessment & Plan:   Principal Problem:   Dyspnea  Assessment and Plan: Acute on chronic combined CHF exacerbation: dyspnea, orthopnea, BNP 2466, CXR with new bilateral pleural effusion, and BLE edema. Continue on BB. Continue on IV lasix as per cadio. Monitor I/Os   Chronic a. fib: continue on metoprolol, warfarin. Pharmacy to dose & monitor warfarin   DM2: HbA1c 7.7, poorly controlled. Continue on SSI w/ accuchecks    Dementia: continue on memantine, aricept. Continue w/ supportive care    HTN: holding home dose of lisinopril   CKDIIIb: Cr is trending up today, likely secondary to IV lasix drip    Hypomagnesemia: WNL today    Demand ischemia: troponins are minimally elevated. This is most consistent with demand/supply mismatch and not ACS        DVT prophylaxis: warfarin Code Status: full  Family Communication: discussed pt's care w/ pt's wife, Bob Brewer, and answered her questions  Disposition Plan: depends on PT/OT recs but likely d/c home   Level of care: Telemetry Cardiac Status is: Inpatient Remains inpatient appropriate because: severity of illness, requiring IV lasix drip    Consultants:  Cardio   Procedures:   Antimicrobials  Subjective: Pt c/o fatigue   Objective: Vitals:   01/03/23 1946 01/03/23 2310 01/04/23 0450 01/04/23 0755  BP: (!) 101/57 109/85 (!) 105/92 110/79  Pulse: (!) 58 97 95 (!) 104  Resp: Temp: 97.8 F (36.6 C) 98.1 F (36.7 C) 98 F (36.7 C) 97.7 F (36.5 C)  TempSrc:    Oral  SpO2: 100% 100% 100% 99%  Weight:      Height:        Intake/Output Summary (Last 24 hours) at 01/04/2023 0842 Last data filed at 01/04/2023 0547 Gross per 24 hour  Intake 146.02 ml  Output 2100 ml  Net -1953.98 ml   Filed Weights   01/01/23 1108  Weight: 80.3 kg    Examination:  General exam: Appears frustrated   Respiratory system: diminished breath sounds b/l  Cardiovascular system: irregularly irregular  Gastrointestinal system: abd is soft, NT, ND & hypoactive bowel sounds. Central nervous system:  Alert and awake. Moves all extremities  Psychiatry: judgement and insight appears poor. Frustrated mood and affect    Data Reviewed: I have personally reviewed following labs and imaging studies  CBC: Recent Labs  Lab 01/01/23 1116 01/02/23 0449 01/03/23 0530 01/04/23 0518  WBC 8.0 10.1 8.1 8.9  HGB 11.8* 11.6* 10.8* 11.3*  HCT 37.6* 36.8* 33.0* 35.1*  MCV 95.4 95.3 91.7 91.6  PLT 268 254 243 231   Basic Metabolic Panel: Recent Labs  Lab 01/01/23 1116 01/02/23 0449 01/03/23 0530 01/04/23 0518  NA 138 140 136 138  K 4.3 4.4 4.5 4.0  CL 105 107 104 103  CO2 21* GLUCOSE 233* 160* 155* 162*  BUN 46* 42* 40* 40*  CREATININE 1.81* 1.73* 1.74* 1.81*  CALCIUM 8.9 8.7* 8.7* 8.6*  MG  --  1.4* 1.8 1.8   GFR: Estimated Creatinine Clearance: 30.6 mL/min (A) (by C-G formula based on SCr of 1.81 mg/dL (H)). Liver Function Tests: No results for input(s): "AST", "ALT", "ALKPHOS", "BILITOT", "PROT", "ALBUMIN" in the last 168 hours. No results for input(s): "LIPASE", "AMYLASE" in the last 168 hours. No results for input(s): "AMMONIA" in the last 168 hours. Coagulation Profile: Recent Labs  Lab 01/01/23 1755 01/02/23 0449 01/03/23 0530 01/04/23 0518  INR 1.3* 1.4* 1.5* 1.7*   Cardiac Enzymes: No results for input(s): "CKTOTAL", "CKMB", "CKMBINDEX", "TROPONINI" in the last 168 hours. BNP (last 3 results) No results for input(s): "PROBNP" in the last 8760 hours. HbA1C: Recent Labs    01/02/23 0449  HGBA1C 7.7*   CBG: No results for input(s): "GLUCAP" in the last 168 hours. Lipid Profile: No results for input(s): "CHOL", "HDL", "LDLCALC", "TRIG", "CHOLHDL", "LDLDIRECT" in the last 72 hours. Thyroid Function Tests: No results for input(s): "TSH", "T4TOTAL", "FREET4",  "T3FREE", "THYROIDAB" in the last 72 hours. Anemia Panel: No results for input(s): "VITAMINB12", "FOLATE", "FERRITIN", "TIBC", "IRON", "RETICCTPCT" in the last 72 hours. Sepsis Labs: No results for input(s): "PROCALCITON", "LATICACIDVEN" in the last 168 hours.  Recent Results (from the past 240 hour(s))  SARS Coronavirus 2 by RT PCR (hospital order, performed in [facility] hospital lab) *cepheid single result test* Anterior Nasal Swab     Status: None   Collection Time: 01/01/23  5:49 PM   Specimen: Anterior Nasal Swab  Result Value Ref Range Status   SARS Coronavirus 2 by RT PCR NEGATIVE NEGATIVE Final    Comment: (NOTE) SARS-CoV-2 target nucleic acids are NOT DETECTED.  The SARS-CoV-2 RNA is generally detectable in upper and lower respiratory specimens during the acute phase of infection. The lowest concentration of SARS-CoV-2 viral copies this assay can detect is 250 copies / mL. A negative result does not preclude SARS-CoV-2 infection and should not be used as the sole basis for treatment or other patient management decisions.  A negative result may occur with improper specimen collection / handling, submission of specimen other than nasopharyngeal swab, presence of viral mutation(s) within the areas targeted by this assay, and inadequate number of viral copies (<250 copies / mL). A negative result must be combined with clinical observations, patient history, and epidemiological information.  Fact Sheet for Patients:   RoadLapTop.co.za  Fact Sheet for Healthcare Providers: http://kim-miller.com/  This test is not yet approved or  cleared by the [place] FDA and has been authorized for detection and/or diagnosis of SARS-CoV-2 by FDA under an Emergency Use Authorization (EUA).  This EUA will remain in effect (meaning this test can be used) for the duration of the COVID-19 declaration under Section 564(b)(1) of the Act, 21  U.S.C. section 360bbb-3(b)(1), unless the authorization is terminated or revoked sooner.  Performed at [facility], [address] [place], [place] [address]          Radiology Studies: ECHOCARDIOGRAM COMPLETE  Result Date: 01/02/2023    ECHOCARDIOGRAM REPORT   Patient Name:   Bob Brewer Date of Exam: 01/02/2023 Medical Rec #:  [id number]       Height:       64.0 in Accession #:    [id number]      Weight:       177.0 lb Date of Birth:  08-17-1941       BSA:          1.857 m Patient Age:    82 years        BP:           98/63 mmHg Patient Gender: M               HR:           115 bpm. Exam Location:  [facility] Procedure: 2D Echo, Cardiac Doppler, Color Doppler and Strain Analysis Indications:  Dyspnea R06.00  History:         Patient has no prior history of Echocardiogram examinations.                  Risk Factors:Diabetes and Hypertension.  Sonographer:     Cristela Blue Referring Phys:  [id number] Inetta Fermo LAI Diagnosing Phys: Alwyn Pea MD  Sonographer Comments: Global longitudinal strain was attempted. IMPRESSIONS  1. Left ventricular ejection fraction, by estimation, is 25 to 30%. The left ventricle has severely decreased function. The left ventricle demonstrates global hypokinesis. The left ventricular internal cavity size was mildly to moderately dilated. Left ventricular diastolic parameters are consistent with Grade II diastolic dysfunction (pseudonormalization).  2. Right ventricular systolic function is low normal. The right ventricular size is mildly enlarged.  3. Left atrial size was mildly dilated.  4. Right atrial size was mildly dilated.  5. The mitral valve is normal in structure. Mild mitral valve regurgitation.  6. The aortic valve was not well visualized. Aortic valve regurgitation is trivial. FINDINGS  Left Ventricle: Left ventricular ejection fraction, by estimation, is 25 to 30%. The left ventricle has severely decreased function. The left ventricle demonstrates  global hypokinesis. The left ventricular internal cavity size was mildly to moderately dilated. There is borderline left ventricular hypertrophy. Left ventricular diastolic parameters are consistent with Grade II diastolic dysfunction (pseudonormalization). Right Ventricle: The right ventricular size is mildly enlarged. No increase in right ventricular wall thickness. Right ventricular systolic function is low normal. Left Atrium: Left atrial size was mildly dilated. Right Atrium: Right atrial size was mildly dilated. Pericardium: There is no evidence of pericardial effusion. Mitral Valve: The mitral valve is normal in structure. Mild mitral valve regurgitation. Tricuspid Valve: The tricuspid valve is normal in structure. Tricuspid valve regurgitation is mild. Aortic Valve: The aortic valve was not well visualized. Aortic valve regurgitation is trivial. Aortic valve mean gradient measures 1.0 mmHg. Aortic valve peak gradient measures 2.4 mmHg. Aortic valve area, by VTI measures 4.78 cm. Pulmonic Valve: The pulmonic valve was normal in structure. Pulmonic valve regurgitation is not visualized. Aorta: The ascending aorta was not well visualized. IAS/Shunts: No atrial level shunt detected by color flow Doppler.  LEFT VENTRICLE PLAX 2D LVIDd:         5.00 cm      Diastology LVIDs:         4.40 cm      LV e' medial:    5.44 cm/s LV PW:         1.10 cm      LV E/e' medial:  25.7 LV IVS:        1.30 cm      LV e' lateral:   7.83 cm/s LVOT diam:     2.00 cm      LV E/e' lateral: 17.9 LV SV:         35 LV SV Index:   19 LVOT Area:     3.14 cm                              3D Volume EF: LV Volumes (MOD)            3D EF:        30 % LV vol d, MOD A2C: 147.0 ml LV EDV:       222 ml LV vol d, MOD A4C: 165.0 ml LV ESV:       155  ml LV vol s, MOD A2C: 118.0 ml LV SV:        66 ml LV vol s, MOD A4C: 130.0 ml LV SV MOD A2C:     29.0 ml LV SV MOD A4C:     165.0 ml LV SV MOD BP:      33.8 ml RIGHT VENTRICLE RV Basal diam:  4.60 cm  RV Mid diam:    4.20 cm RV S prime:     5.22 cm/s LEFT ATRIUM           Index        RIGHT ATRIUM           Index LA diam:      4.00 cm 2.15 cm/m   RA Area:     10.90 cm LA Vol (A2C): 38.8 ml 20.89 ml/m  RA Volume:   19.80 ml  10.66 ml/m LA Vol (A4C): 70.5 ml 37.96 ml/m  AORTIC VALVE AV Area (Vmax):    2.86 cm AV Area (Vmean):   3.20 cm AV Area (VTI):     4.78 cm AV Vmax:           78.00 cm/s AV Vmean:          49.300 cm/s AV VTI:            0.073 m AV Peak Grad:      2.4 mmHg AV Mean Grad:      1.0 mmHg LVOT Vmax:         71.10 cm/s LVOT Vmean:        50.200 cm/s LVOT VTI:          0.111 m LVOT/AV VTI ratio: 1.52  AORTA Ao Root diam: 3.50 cm MITRAL VALVE                  TRICUSPID VALVE MV Area (PHT): 6.48 cm       TR Peak grad:   34.1 mmHg MV Decel Time: 117 msec       TR Vmax:        292.00 cm/s MR Peak grad:    71.2 mmHg MR Mean grad:    46.0 mmHg    SHUNTS MR Vmax:         422.00 cm/s  Systemic VTI:  0.11 m MR Vmean:        313.0 cm/s   Systemic Diam: 2.00 cm MR PISA:         6.28 cm MR PISA Eff ROA: 52 mm MR PISA Radius:  1.00 cm MV E velocity: 140.00 cm/s Dwayne D Callwood MD Electronically signed by Alwyn Pea MD Signature Date/Time: 01/02/2023/12:49:30 PM    Final         Scheduled Meds:  donepezil  10 mg Oral Daily   finasteride  5 mg Oral Daily   memantine  10 mg Oral BID   metoprolol tartrate  12.5 mg Oral TID   pantoprazole  40 mg Oral Daily   tamsulosin  0.4 mg Oral Daily   warfarin  7.5 mg Oral ONCE-1600   Warfarin - Pharmacist Dosing Inpatient   Does not apply q1600   Continuous Infusions:  furosemide (LASIX) 200 mg in dextrose 5 % 100 mL (2 mg/mL) infusion 8 mg/hr (01/04/23 0654)     LOS: 2 days    Time spent: 30 mins     Charise Killian, MD [facility] Pager [phone]  If 7PM-7AM, please contact night-coverage [email or web address] 01/04/2023, 8:42 AM

## 2023-01-05 DIAGNOSIS — I482 Chronic atrial fibrillation, unspecified: Secondary | ICD-10-CM | POA: Diagnosis not present

## 2023-01-05 DIAGNOSIS — I5043 Acute on chronic combined systolic (congestive) and diastolic (congestive) heart failure: Secondary | ICD-10-CM | POA: Diagnosis not present

## 2023-01-05 DIAGNOSIS — F0390 Unspecified dementia without behavioral disturbance: Secondary | ICD-10-CM | POA: Diagnosis not present

## 2023-01-05 LAB — PROTIME-INR
INR: 1.8 — ABNORMAL HIGH (ref 0.8–1.2)
Prothrombin Time: 20.7 seconds — ABNORMAL HIGH (ref 11.4–15.2)

## 2023-01-05 LAB — CBC
HCT: 37.2 % — ABNORMAL LOW (ref 39.0–52.0)
Hemoglobin: 12 g/dL — ABNORMAL LOW (ref 13.0–17.0)
MCH: 29.5 pg (ref 26.0–34.0)
MCHC: 32.3 g/dL (ref 30.0–36.0)
MCV: 91.4 fL (ref 80.0–100.0)
Platelets: 234 10*3/uL (ref 150–400)
RBC: 4.07 MIL/uL — ABNORMAL LOW (ref 4.22–5.81)
RDW: 13.2 % (ref 11.5–15.5)
WBC: 8.8 10*3/uL (ref 4.0–10.5)
nRBC: 0 % (ref 0.0–0.2)

## 2023-01-05 LAB — BASIC METABOLIC PANEL
Anion gap: 10 (ref 5–15)
BUN: 39 mg/dL — ABNORMAL HIGH (ref 8–23)
CO2: 26 mmol/L (ref 22–32)
Calcium: 8.4 mg/dL — ABNORMAL LOW (ref 8.9–10.3)
Chloride: 101 mmol/L (ref 98–111)
Creatinine, Ser: 1.68 mg/dL — ABNORMAL HIGH (ref 0.61–1.24)
GFR, Estimated: 41 mL/min — ABNORMAL LOW (ref >60)
Glucose, Bld: 176 mg/dL — ABNORMAL HIGH (ref 70–99)
Potassium: 3.5 mmol/L (ref 3.5–5.1)
Sodium: 137 mmol/L (ref 135–145)

## 2023-01-05 LAB — MAGNESIUM: Magnesium: 2.5 mg/dL — ABNORMAL HIGH (ref 1.7–2.4)

## 2023-01-05 MED ORDER — WARFARIN SODIUM 10 MG PO TABS
10.0000 mg | ORAL_TABLET | Freq: Once | ORAL | Status: AC
  Administered 2023-01-05: 10 mg via ORAL
  Filled 2023-01-05: qty 1

## 2023-01-05 MED ORDER — METOLAZONE 2.5 MG PO TABS
2.5000 mg | ORAL_TABLET | Freq: Once | ORAL | Status: AC
  Administered 2023-01-05: 2.5 mg via ORAL
  Filled 2023-01-05: qty 1

## 2023-01-05 MED ORDER — SPIRONOLACTONE 12.5 MG HALF TABLET
12.5000 mg | ORAL_TABLET | Freq: Every day | ORAL | Status: DC
  Administered 2023-01-05 – 2023-01-07 (×3): 12.5 mg via ORAL
  Filled 2023-01-05 (×3): qty 1

## 2023-01-05 MED ORDER — METOPROLOL TARTRATE 25 MG PO TABS
25.0000 mg | ORAL_TABLET | Freq: Two times a day (BID) | ORAL | Status: DC
  Administered 2023-01-05 – 2023-01-06 (×3): 25 mg via ORAL
  Filled 2023-01-05 (×3): qty 1

## 2023-01-05 MED ORDER — POTASSIUM CHLORIDE CRYS ER 20 MEQ PO TBCR
40.0000 meq | EXTENDED_RELEASE_TABLET | Freq: Once | ORAL | Status: AC
  Administered 2023-01-05: 40 meq via ORAL
  Filled 2023-01-05: qty 2

## 2023-01-05 NOTE — Progress Notes (Signed)
ANTICOAGULATION CONSULT NOTE - Initial Consult  Pharmacy Consult for warfarin Indication: atrial fibrillation  No Known Allergies  Patient Measurements: Height:  (162.6 cm) Weight: 75 kg (165 lb 5.5 oz) IBW/kg (Calculated) : 59.2  Vital Signs: Temp: 97.4 F (36.3 C) (04/19 1226) Temp Source: Oral (04/19 0335) BP: 110/68 (04/19 1226) Pulse Rate: 90 (04/19 1226)  Labs: Recent Labs    01/03/23 0530 01/04/23 0518 01/05/23 0357  HGB 10.8* 11.3* 12.0*  HCT 33.0* 35.1* 37.2*  PLT 243 231 234  LABPROT 17.9* 19.7* 20.7*  INR 1.5* 1.7* 1.8*  CREATININE 1.74* 1.81* 1.68*     Estimated Creatinine Clearance: 31.9 mL/min (A) (by C-G formula based on SCr of 1.68 mg/dL (H)).   Medical History: Past Medical History:  Diagnosis Date   Diabetes mellitus without complication    Hypertension    Memory loss     Medications:  Warfarin 5 mg Monday-Friday, Warfarin 7.5 mg Saturday and Sunday (40 mg /week).  Assessment: 82 year old male PMH atrial fibrillation on warfarin (Chadsvasc at least 4), T2DM, HTN admitted with CHF exacerbation.    No DDI's identified  Goal of Therapy:  INR 2-3 Monitor platelets by anticoagulation protocol: Yes   Date INR Warfarin dose/comments  4/15 1.3  2.5mg  x 1  (plus  PTA)  4/16 1.4 7.5 mg  4/17 1.5 7.5 mg  4/18 1.7 7.5 mg  4/19 1.8    Plan: INR remains subtherapeutic, but trending upward. Will order warfarin  x1 dose today and continue daily INR with AM labs.   Bettey Costa, PharmD Clinical Pharmacist 01/05/2023 2:00 PM

## 2023-01-05 NOTE — Progress Notes (Signed)
Physical Therapy Treatment Patient Details Name: Bob Brewer MRN: [id number] DOB: 1941/05/25 Today's Date: 01/05/2023   History of Present Illness Pt is an 82 y.o. male presenting to hospital 01/01/23 with c/o SOB (worsening past week); worse with exertion and laying flat; swelling noted to LE's.  Pt admitted with acute CHF exacerbation.  PMH includes chronic a-fib on warfarin, mild dementia, DM, htn, back surgery, memory loss, chronic LBBB.    PT Comments    Pt was long sitting in bed upon arrival. A but disoriented x 3. Oriented to self only. He endorses R knee pain 4/10. Was able to consistently follow commands throughout. HR irregular throughout bouncing between mid 90s and upper 140s bpm. During gait with RW, pt does not have LOB but without AD he is unsteady. During gait sao2 reading in the 70s however author questions oximeter reliability due to pt's cold hands and lack of feeling lightheaded. He will continue to benefit from skilled PT going forward to maximize his safety and independence with all ADLs.    Recommendations for follow up therapy are one component of a multi-disciplinary discharge planning process, led by the attending physician.  Recommendations may be updated based on patient status, additional functional criteria and insurance authorization.  Assistance Recommended at Discharge Intermittent Supervision/Assistance (if pt has 24/7 supervision)  Patient can return home with the following A little help with walking and/or transfers;A little help with bathing/dressing/bathroom;Assistance with cooking/housework;Assist for transportation;Help with stairs or ramp for entrance   Equipment Recommendations  Rolling walker (2 wheels)       Precautions / Restrictions Precautions Precautions: Fall Restrictions Weight Bearing Restrictions: No     Mobility  Bed Mobility Overal bed mobility: Modified Independent   Transfers Overall transfer level: Needs  assistance Equipment used: Rolling walker (2 wheels), None Transfers: Sit to/from Stand Sit to Stand: Min guard   Ambulation/Gait Ambulation/Gait assistance: Min guard Gait Distance (Feet): 50 Feet Assistive device: None, Rolling walker (2 wheels) Gait Pattern/deviations: Step-through pattern Gait velocity: decreased     General Gait Details: Pt was able to ambulate 50 ft with RW and 20 ft without AD. HR was irrugulat between 98bpm and 148bpm . sao2 reading in the 70s however author questions reliability of reading. He did not endorse lightheadedness or fatigue but did endorse R knee pain    Balance Overall balance assessment: Needs assistance Sitting-balance support: No upper extremity supported, Feet supported Sitting balance-Leahy Scale: Good     Standing balance support: Bilateral upper extremity supported, During functional activity, Reliant on assistive device for balance Standing balance-Leahy Scale: Good Standing balance comment: pt has good balance with BUE support however only fair balance wihtout AD. Highly recommend use of AD going forwrad       Cognition Arousal/Alertness: Awake/alert Behavior During Therapy: WFL for tasks assessed/performed Overall Cognitive Status: No family/caregiver present to determine baseline cognitive functioning      General Comments: Pt is alerta nd able to follow commands however disoriented. only truely oriented to self           General Comments General comments (skin integrity, edema, etc.): Pt on 2L O2 throughout session; O2 saturation 95%. Pt frustrated with needing O2 nasal cannula. Needed cues to adjust, as one of the nostrils was in when OT arrived. Pt able to walk approx 15 feet from sink around the bed to recliner chair with CGA.      Pertinent Vitals/Pain Pain Assessment Pain Assessment: 0-10 Pain Score: 4  Pain Location: R knee  Pain Descriptors / Indicators: Discomfort Pain Intervention(s): Limited activity within  patient's tolerance, Monitored during session, Premedicated before session, Repositioned     PT Goals (current goals can now be found in the care plan section) Acute Rehab PT Goals Patient Stated Goal: none stated Progress towards PT goals: Progressing toward goals    Frequency    Min 2X/week      PT Plan Current plan remains appropriate       AM-PAC PT "6 Clicks" Mobility   Outcome Measure  Help needed turning from your back to your side while in a flat bed without using bedrails?: None Help needed moving from lying on your back to sitting on the side of a flat bed without using bedrails?: None Help needed moving to and from a bed to a chair (including a wheelchair)?: A Little Help needed standing up from a chair using your arms (e.g., wheelchair or bedside chair)?: A Little Help needed to walk in hospital room?: A Little Help needed climbing 3-5 steps with a railing? : A Little 6 Click Score: 20    End of Session Equipment Utilized During Treatment: Gait belt;Oxygen Activity Tolerance: Patient tolerated treatment well;Patient limited by pain (limited by R knee pain) Patient left: in bed;with call bell/phone within reach;with bed alarm set Nurse Communication: Mobility status PT Visit Diagnosis: Other abnormalities of gait and mobility (R26.89);Muscle weakness (generalized) (M62.81);Pain Pain - Right/Left: Left Pain - part of body: Ankle and joints of foot     Time: 1346-1405 PT Time Calculation (min) (ACUTE ONLY): 19 min  Charges:  $Gait Training: 8-22 mins                     Jetta Lout PTA 01/05/23, 2:49 PM

## 2023-01-05 NOTE — Progress Notes (Signed)
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 Continue on metoprolol & continue on IV lasix drip as per cario. Monitor I/Os   Chronic a. fib: continue on metoprolol, warfarin. Pharmacy to dose & monitor warfarin     DM2: poorly controlled, HbA1c 7.7. Continue on SSI w/ accuchecks    Dementia: continue on aricept, memantine. Continue w/ supportive care  HTN: holding home dose of lisinopril. BP is WNL currently    CKDIIIb: Cr is labile. Continue to monitor closely while on IV lasix drip    Hypomagnesemia: WNL today    Demand ischemia: troponins are minimally elevated. This is most consistent with demand/supply mismatch and not ACS        DVT prophylaxis: warfarin Code Status: full  Family Communication: discussed pt's care w/ pt's wife, Eber Jones, and answered her questions  Disposition Plan: likely d/c back home   Level of care: Telemetry Cardiac Status is: Inpatient Remains inpatient appropriate because: severity of illness, requiring IV lasix drip    Consultants:  Cardio   Procedures:   Antimicrobials  Subjective: Pt c/o wanting to go home and fatigue   Objective: Vitals:   01/04/23 1926 01/04/23 2329 01/05/23 0335 01/05/23 0830  BP: 127/86 (!) 124/91 105/84 (!) 125/95  Pulse: (!) 105 (!) 109 93 (!) 43  Resp: Temp: 98.2 F (36.8 C) 98.9 F (37.2 C) 98.5 F (36.9 C) 98 F (36.7 C)  TempSrc: Oral Oral Oral   SpO2: 99% 98% 100% 99%  Weight:   75 kg   Height:        Intake/Output Summary (Last 24 hours) at 01/05/2023 0858 Last data filed at 01/05/2023 0831 Gross per 24 hour  Intake --  Output 3600 ml  Net -3600 ml   Filed Weights   01/01/23 1108 01/04/23 0900 01/05/23 0335   Weight: 80.3 kg 74.9 kg 75 kg    Examination:  General exam: Appears comfortable  Respiratory system: decreased breath sounds b/l  Cardiovascular system: irregularly irregular  Gastrointestinal system: abd is soft, NT, ND & hypoactive bowel sounds  Central nervous system:  Alert and awake. Moves all extremities  Psychiatry: judgement and insight appears poor. Flat mood and affect    Data Reviewed: I have personally reviewed following labs and imaging studies  CBC: Recent Labs  Lab 01/01/23 1116 01/02/23 0449 01/03/23 0530 01/04/23 0518 01/05/23 0357  WBC 8.0 10.1 8.1 8.9 8.8  HGB 11.8* 11.6* 10.8* 11.3* 12.0*  HCT 37.6* 36.8* 33.0* 35.1* 37.2*  MCV 95.4 95.3 91.7 91.6 91.4  PLT 268 254 243 231 234   Basic Metabolic Panel: Recent Labs  Lab 01/01/23 1116 01/02/23 0449 01/03/23 0530 01/04/23 0518 01/05/23 0357  NA 138 140 136 138 137  K 4.3 4.4 4.5 4.0 3.5  CL 105 107 104 103 101  CO2 21* GLUCOSE 233* 160* 155* 162* 176*  BUN 46* 42* 40* 40* 39*  CREATININE 1.81* 1.73* 1.74* 1.81* 1.68*  CALCIUM 8.9 8.7* 8.7* 8.6* 8.4*  MG  --  1.4* 1.8 1.8 2.5*   GFR: Estimated Creatinine Clearance: 31.9 mL/min (A) (by C-G formula based on SCr of 1.68 mg/dL (H)). Liver Function Tests: No results for input(s): "  AST", "ALT", "ALKPHOS", "BILITOT", "PROT", "ALBUMIN" in the last 168 hours. No results for input(s): "LIPASE", "AMYLASE" in the last 168 hours. No results for input(s): "AMMONIA" in the last 168 hours. Coagulation Profile: Recent Labs  Lab 01/01/23 1755 01/02/23 0449 01/03/23 0530 01/04/23 0518 01/05/23 0357  INR 1.3* 1.4* 1.5* 1.7* 1.8*   Cardiac Enzymes: No results for input(s): "CKTOTAL", "CKMB", "CKMBINDEX", "TROPONINI" in the last 168 hours. BNP (last 3 results) No results for input(s): "PROBNP" in the last 8760 hours. HbA1C: No results for input(s): "HGBA1C" in the last 72 hours.  CBG: No results for input(s): "GLUCAP" in the last 168  hours. Lipid Profile: No results for input(s): "CHOL", "HDL", "LDLCALC", "TRIG", "CHOLHDL", "LDLDIRECT" in the last 72 hours. Thyroid Function Tests: No results for input(s): "TSH", "T4TOTAL", "FREET4", "T3FREE", "THYROIDAB" in the last 72 hours. Anemia Panel: No results for input(s): "VITAMINB12", "FOLATE", "FERRITIN", "TIBC", "IRON", "RETICCTPCT" in the last 72 hours. Sepsis Labs: No results for input(s): "PROCALCITON", "LATICACIDVEN" in the last 168 hours.  Recent Results (from the past 240 hour(s))  SARS Coronavirus 2 by RT PCR (hospital order, performed in [facility] hospital lab) *cepheid single result test* Anterior Nasal Swab     Status: None   Collection Time: 01/01/23  5:49 PM   Specimen: Anterior Nasal Swab  Result Value Ref Range Status   SARS Coronavirus 2 by RT PCR NEGATIVE NEGATIVE Final    Comment: (NOTE) SARS-CoV-2 target nucleic acids are NOT DETECTED.  The SARS-CoV-2 RNA is generally detectable in upper and lower respiratory specimens during the acute phase of infection. The lowest concentration of SARS-CoV-2 viral copies this assay can detect is 250 copies / mL. A negative result does not preclude SARS-CoV-2 infection and should not be used as the sole basis for treatment or other patient management decisions.  A negative result may occur with improper specimen collection / handling, submission of specimen other than nasopharyngeal swab, presence of viral mutation(s) within the areas targeted by this assay, and inadequate number of viral copies (<250 copies / mL). A negative result must be combined with clinical observations, patient history, and epidemiological information.  Fact Sheet for Patients:   RoadLapTop.co.za  Fact Sheet for Healthcare Providers: http://kim-miller.com/  This test is not yet approved or  cleared by the [place] FDA and has been authorized for detection and/or diagnosis of SARS-CoV-2  by FDA under an Emergency Use Authorization (EUA).  This EUA will remain in effect (meaning this test can be used) for the duration of the COVID-19 declaration under Section 564(b)(1) of the Act, 21 U.S.C. section 360bbb-3(b)(1), unless the authorization is terminated or revoked sooner.  Performed at [facility], [address] [place], [place] [address]          Radiology Studies: No results found.      Scheduled Meds:  donepezil  10 mg Oral Daily   finasteride  5 mg Oral Daily   memantine  10 mg Oral BID   metoprolol tartrate  12.5 mg Oral TID   pantoprazole  40 mg Oral Daily   tamsulosin  0.4 mg Oral Daily   warfarin  10 mg Oral ONCE-1600   Warfarin - Pharmacist Dosing Inpatient   Does not apply q1600   Continuous Infusions:  furosemide (LASIX) 200 mg in dextrose 5 % 100 mL (2 mg/mL) infusion 10 mg/hr (01/05/23 0433)     LOS: 3 days    Time spent: 25 mins     Charise Killian, MD  [facility] Pager [phone]  If 7PM-7AM, please contact night-coverage [email or web address] 01/05/2023, 8:58 AM

## 2023-01-05 NOTE — TOC Progression Note (Signed)
Transition of Care [facility]) - Progression Note    Patient Details  Name: Bob Brewer MRN: [id number] Date of Birth: 03-31-1941  [facility] CM/SW Contact  Truddie Hidden, RN Phone Number: 01/05/2023, 1:49 PM  Clinical Narrative:    Spoke with patient's wife regarding HH Rec for [facility] PT/OT. She is agreeable and does not have a preference of provider. Wife reffused RW. She stated patient will not use it.  Referral sent to [facility] [place] from [facility].         Expected Discharge Plan and Services                                               Social Determinants of Health (SDOH) Interventions SDOH Screenings   Food Insecurity: No Food Insecurity (08/30/2021)  Housing: Low Risk  (08/30/2021)  Transportation Needs: No Transportation Needs (08/30/2021)  Alcohol Screen: Low Risk  (08/30/2021)  Depression (PHQ2-9): Low Risk  (11/21/2021)  Financial Resource Strain: Low Risk  (08/30/2021)  Physical Activity: Inactive (08/30/2021)  Social Connections: Socially Integrated (08/30/2021)  Stress: No Stress Concern Present (08/30/2021)  Tobacco Use: Medium Risk (01/01/2023)    Readmission Risk Interventions     No data to display

## 2023-01-05 NOTE — Care Management Important Message (Signed)
Important Message  Patient Details  Name: Bob Brewer MRN: [id number] Date of Birth: May 23, 1941   Medicare Important Message Given:  Yes     [name] 01/05/2023, 2:30 PM

## 2023-01-05 NOTE — Progress Notes (Signed)
[facility] CLINIC CARDIOLOGY CONSULT NOTE       Patient ID: Bob Brewer MRN: [id number] DOB/AGE: September 30, 1940 82 y.o.  Admit date: 01/01/2023 Referring Physician Dr Darlin Priestly Primary Physician Kara Dies, NP Primary Cardiologist prev Dr. Beatrix Fetters (last seen 08/2021)  Reason for Consultation CHF  HPI: Bob Brewer is an 61yoM with a PMH of HFmrEF (45-50% 09/2021), chronic LBBB, chronic atrial fibrillation (warfarin), DM2, HTN, memory impairment, who presented to [facility] ED 01/01/2023 with shortness of breath and peripheral edema worsening over 2-3 weeks.  Cardiology is consulted for assistance with his heart failure. Diuresing well and clinically improving on a lasix gtt.   Interval History:  -continues to diurese well, renal function improving on lasix gtt - no chest pain or other complaints - wants to go home   Net IO Since Admission: -5,473.98 mL [01/05/23 1258]  ^ some not measured d/t incontinence and leaky male purewick   Review of systems complete and found to be negative unless listed above     Past Medical History:  Diagnosis Date   Diabetes mellitus without complication    Hypertension    Memory loss     Past Surgical History:  Procedure Laterality Date   BACK SURGERY  1992   also 1994    Medications Prior to Admission  Medication Sig Dispense Refill Last Dose   cyanocobalamin (VITAMIN B12) 1000 MCG/ML injection Inject 1 ml subcutaneously once a month 3 mL 3 Past Week   donepezil (ARICEPT) 10 MG tablet Take 1 tablet (10 mg total) by mouth daily. 90 tablet 4 01/01/2023   finasteride (PROSCAR) 5 MG tablet TAKE 1 TABLET BY MOUTH  DAILY 90 tablet 3 01/01/2023   furosemide (LASIX) 40 MG tablet TAKE 1 TABLET BY MOUTH DAILY (Patient taking differently: Take 40 mg by mouth 2 (two) times daily.) 100 tablet 2 01/01/2023   lisinopril (ZESTRIL) 40 MG tablet TAKE 1 TABLET BY MOUTH DAILY 100 tablet 1 01/01/2023   meloxicam (MOBIC) 15 MG tablet TAKE 1 TABLET BY MOUTH  DAILY 100  tablet 2 01/01/2023   memantine (NAMENDA) 10 MG tablet Take 1 tablet (10 mg total) by mouth 2 (two) times daily. Please call and make overdue appt for further refills, 1st attempt 180 tablet 4 01/01/2023   metFORMIN (GLUCOPHAGE) 500 MG tablet TAKE 1 TABLET BY MOUTH TWICE  DAILY WITH MEALS 200 tablet 2 01/01/2023   metoprolol succinate (TOPROL-XL) 25 MG 24 hr tablet TAKE 1 TABLET BY MOUTH DAILY 100 tablet 2 01/01/2023   omeprazole (PRILOSEC) 20 MG capsule TAKE 1 CAPSULE BY MOUTH DAILY 100 capsule 2 01/01/2023   pravastatin (PRAVACHOL) 40 MG tablet TAKE 1 TABLET BY MOUTH DAILY 100 tablet 1 01/01/2023   spironolactone (ALDACTONE) 25 MG tablet TAKE 1 TABLET BY MOUTH DAILY 100 tablet 2 01/01/2023   tamsulosin (FLOMAX) 0.4 MG CAPS capsule TAKE 1 CAPSULE BY MOUTH  DAILY 90 capsule 3 01/01/2023   VITAMIN D PO Take 2,000 Units by mouth daily.   Past Month   warfarin (COUMADIN) 2.5 MG tablet Take 1 tablet (2.5 mg total) by mouth 2 (two) times a week. Take 2.5mg  in addition to the 5mg  on Saturday and Sunday 90 tablet 3 12/31/2022   warfarin (COUMADIN) 5 MG tablet Take 1 tablet (5 mg total) by mouth daily. Take 5 mg Monday - Friday 90 tablet 3 01/01/2023   Alcohol Swabs (ALCOHOL PREP) PADS 1 each by Does not apply route daily. 300 each 3    B-D TB SYRINGE  1CC/27GX1/2" 27G X 1/2" 1 ML MISC USE AS DIRECTED WITH  CYANOCOBALAMIN 4 each 4    Blood Glucose Monitoring Suppl (TRUE METRIX AIR GLUCOSE METER) DEVI Use to check blood sugar 2 times daily 1 each 6    furosemide (LASIX) 20 MG tablet Take 20 mg by mouth daily. (Patient not taking: Reported on 01/01/2023)   Not Taking   glucose blood (RELION TRUE METRIX TEST STRIPS) test strip Check sugar 2 times daily 300 each 3    Lancets 33G MISC Check blood sugar 2 times daily 300 each 3     Social History   Socioeconomic History   Marital status: Married    Spouse name: Eber Jones   Number of children: Not on file   Years of education: Not on file   Highest education level:  12th grade  Occupational History   Occupation: Retired  Tobacco Use   Smoking status: Former    Types: Cigarettes   Smokeless tobacco: Never  Substance and Sexual Activity   Alcohol use: Not Currently   Drug use: Never   Sexual activity: Not Currently  Other Topics Concern   Not on file  Social History Narrative   Life with wife   Right handed   Drinks 5-6 cups caffeine daily   Social Determinants of Health   Financial Resource Strain: Low Risk  (08/30/2021)   Overall Financial Resource Strain (CARDIA)    Difficulty of Paying Living Expenses: Not hard at all  Food Insecurity: No Food Insecurity (08/30/2021)   Hunger Vital Sign    Worried About Running Out of Food in the Last Year: Never true    Ran Out of Food in the Last Year: Never true  Transportation Needs: No Transportation Needs (08/30/2021)   PRAPARE - Administrator, Civil Service (Medical): No    Lack of Transportation (Non-Medical): No  Physical Activity: Inactive (08/30/2021)   Exercise Vital Sign    Days of Exercise per Week: 0 days    Minutes of Exercise per Session: 0 min  Stress: No Stress Concern Present (08/30/2021)   [employer] of Occupational Health - Occupational Stress Questionnaire    Feeling of Stress : Not at all  Social Connections: Socially Integrated (08/30/2021)   Social Connection and Isolation Panel [NHANES]    Frequency of Communication with Friends and Family: More than three times a week    Frequency of Social Gatherings with Friends and Family: Twice a week    Attends Religious Services: More than 4 times per year    Active Member of [employer] or Organizations: Yes    Attends Banker Meetings: More than 4 times per year    Marital Status: Married  Catering manager Violence: Not At Risk (08/30/2021)   Humiliation, Afraid, Rape, and Kick questionnaire    Fear of Current or Ex-Partner: No    Emotionally Abused: No    Physically Abused: No    Sexually Abused:  No    History reviewed. No pertinent family history.    Intake/Output Summary (Last 24 hours) at 01/05/2023 0908 Last data filed at 01/05/2023 0831 Gross per 24 hour  Intake --  Output 3600 ml  Net -3600 ml     Vitals:   01/04/23 1926 01/04/23 2329 01/05/23 0335 01/05/23 0830  BP: 127/86 (!) 124/91 105/84 (!) 125/95  Pulse: (!) 105 (!) 109 93 (!) 43  Resp: 18 19 16 16   Temp: 98.2 F (36.8 C) 98.9 F (37.2 C) 98.5 F (  36.9 C) 98 F (36.7 C)  TempSrc: Oral Oral Oral   SpO2: 99% 98% 100% 99%  Weight:   75 kg   Height:        PHYSICAL EXAM General: Elderly male, well nourished, in no acute distress. Laying at angle in bed, no family present HEENT:  Normocephalic and atraumatic. Neck:  + JVD.  Lungs: Normal respiratory effort on 2L [place]  Bibasilar crackles without appreciable wheezes.   Heart: irregularly irregular with controled rate. Normal S1 and S2 without gallops or murmurs.  Abdomen: Non-distended appearing.  Msk: Normal strength and tone for age. Extremities: warm to touch, 1-2+ bilateral lower extremity with skin dimpling and wrinkling in both feet with overall improvement.  Left shin with 3 discrete chronic appearing ulcers - remain covered with bandages  Neuro: Awake and alert, is a limited historian Psych:  Answers questions appropriately.   Labs: Basic Metabolic Panel: Recent Labs    01/04/23 0518 01/05/23 0357  NA 138 137  K 4.0 3.5  CL 103 101  CO2 24 26  GLUCOSE 162* 176*  BUN 40* 39*  CREATININE 1.81* 1.68*  CALCIUM 8.6* 8.4*  MG 1.8 2.5*    Liver Function Tests: No results for input(s): "AST", "ALT", "ALKPHOS", "BILITOT", "PROT", "ALBUMIN" in the last 72 hours. No results for input(s): "LIPASE", "AMYLASE" in the last 72 hours. CBC: Recent Labs    01/04/23 0518 01/05/23 0357  WBC 8.9 8.8  HGB 11.3* 12.0*  HCT 35.1* 37.2*  MCV 91.6 91.4  PLT 231 234    Cardiac Enzymes: No results for input(s): "CKTOTAL", "CKMB", "CKMBINDEX",  "TROPONINIHS" in the last 72 hours.  BNP: No results for input(s): "BNP" in the last 72 hours.  D-Dimer: No results for input(s): "DDIMER" in the last 72 hours. Hemoglobin A1C: No results for input(s): "HGBA1C" in the last 72 hours.  Fasting Lipid Panel: No results for input(s): "CHOL", "HDL", "LDLCALC", "TRIG", "CHOLHDL", "LDLDIRECT" in the last 72 hours. Thyroid Function Tests: No results for input(s): "TSH", "T4TOTAL", "T3FREE", "THYROIDAB" in the last 72 hours.  Invalid input(s): "FREET3" Anemia Panel: No results for input(s): "VITAMINB12", "FOLATE", "FERRITIN", "TIBC", "IRON", "RETICCTPCT" in the last 72 hours.   Radiology: ECHOCARDIOGRAM COMPLETE  Result Date: 01/02/2023    ECHOCARDIOGRAM REPORT   Patient Name:   Bob Brewer Date of Exam: 01/02/2023 Medical Rec #:  [id number]       Height:       64.0 in Accession #:    [id number]      Weight:       177.0 lb Date of Birth:  01/10/1941       BSA:          1.857 m Patient Age:    82 years        BP:           98/63 mmHg Patient Gender: M               HR:           115 bpm. Exam Location:  [facility] Procedure: 2D Echo, Cardiac Doppler, Color Doppler and Strain Analysis Indications:     Dyspnea R06.00  History:         Patient has no prior history of Echocardiogram examinations.                  Risk Factors:Diabetes and Hypertension.  Sonographer:     Cristela Blue Referring Phys:  [id number] TINA LAI Diagnosing Phys: Bobbie Stack  Callwood MD  Sonographer Comments: Global longitudinal strain was attempted. IMPRESSIONS  1. Left ventricular ejection fraction, by estimation, is 25 to 30%. The left ventricle has severely decreased function. The left ventricle demonstrates global hypokinesis. The left ventricular internal cavity size was mildly to moderately dilated. Left ventricular diastolic parameters are consistent with Grade II diastolic dysfunction (pseudonormalization).  2. Right ventricular systolic function is low normal. The right ventricular size is  mildly enlarged.  3. Left atrial size was mildly dilated.  4. Right atrial size was mildly dilated.  5. The mitral valve is normal in structure. Mild mitral valve regurgitation.  6. The aortic valve was not well visualized. Aortic valve regurgitation is trivial. FINDINGS  Left Ventricle: Left ventricular ejection fraction, by estimation, is 25 to 30%. The left ventricle has severely decreased function. The left ventricle demonstrates global hypokinesis. The left ventricular internal cavity size was mildly to moderately dilated. There is borderline left ventricular hypertrophy. Left ventricular diastolic parameters are consistent with Grade II diastolic dysfunction (pseudonormalization). Right Ventricle: The right ventricular size is mildly enlarged. No increase in right ventricular wall thickness. Right ventricular systolic function is low normal. Left Atrium: Left atrial size was mildly dilated. Right Atrium: Right atrial size was mildly dilated. Pericardium: There is no evidence of pericardial effusion. Mitral Valve: The mitral valve is normal in structure. Mild mitral valve regurgitation. Tricuspid Valve: The tricuspid valve is normal in structure. Tricuspid valve regurgitation is mild. Aortic Valve: The aortic valve was not well visualized. Aortic valve regurgitation is trivial. Aortic valve mean gradient measures 1.0 mmHg. Aortic valve peak gradient measures 2.4 mmHg. Aortic valve area, by VTI measures 4.78 cm. Pulmonic Valve: The pulmonic valve was normal in structure. Pulmonic valve regurgitation is not visualized. Aorta: The ascending aorta was not well visualized. IAS/Shunts: No atrial level shunt detected by color flow Doppler.  LEFT VENTRICLE PLAX 2D LVIDd:         5.00 cm      Diastology LVIDs:         4.40 cm      LV e' medial:    5.44 cm/s LV PW:         1.10 cm      LV E/e' medial:  25.7 LV IVS:        1.30 cm      LV e' lateral:   7.83 cm/s LVOT diam:     2.00 cm      LV E/e' lateral: 17.9 LV SV:          35 LV SV Index:   19 LVOT Area:     3.14 cm                              3D Volume EF: LV Volumes (MOD)            3D EF:        30 % LV vol d, MOD A2C: 147.0 ml LV EDV:       222 ml LV vol d, MOD A4C: 165.0 ml LV ESV:       155 ml LV vol s, MOD A2C: 118.0 ml LV SV:        66 ml LV vol s, MOD A4C: 130.0 ml LV SV MOD A2C:     29.0 ml LV SV MOD A4C:     165.0 ml LV SV MOD BP:      33.8 ml RIGHT  VENTRICLE RV Basal diam:  4.60 cm RV Mid diam:    4.20 cm RV S prime:     5.22 cm/s LEFT ATRIUM           Index        RIGHT ATRIUM           Index LA diam:      4.00 cm 2.15 cm/m   RA Area:     10.90 cm LA Vol (A2C): 38.8 ml 20.89 ml/m  RA Volume:   19.80 ml  10.66 ml/m LA Vol (A4C): 70.5 ml 37.96 ml/m  AORTIC VALVE AV Area (Vmax):    2.86 cm AV Area (Vmean):   3.20 cm AV Area (VTI):     4.78 cm AV Vmax:           78.00 cm/s AV Vmean:          49.300 cm/s AV VTI:            0.073 m AV Peak Grad:      2.4 mmHg AV Mean Grad:      1.0 mmHg LVOT Vmax:         71.10 cm/s LVOT Vmean:        50.200 cm/s LVOT VTI:          0.111 m LVOT/AV VTI ratio: 1.52  AORTA Ao Root diam: 3.50 cm MITRAL VALVE                  TRICUSPID VALVE MV Area (PHT): 6.48 cm       TR Peak grad:   34.1 mmHg MV Decel Time: 117 msec       TR Vmax:        292.00 cm/s MR Peak grad:    71.2 mmHg MR Mean grad:    46.0 mmHg    SHUNTS MR Vmax:         422.00 cm/s  Systemic VTI:  0.11 m MR Vmean:        313.0 cm/s   Systemic Diam: 2.00 cm MR PISA:         6.28 cm MR PISA Eff ROA: 52 mm MR PISA Radius:  1.00 cm MV E velocity: 140.00 cm/s Alwyn Pea MD Electronically signed by Alwyn Pea MD Signature Date/Time: 01/02/2023/12:49:30 PM    Final    DG Chest 2 View  Result Date: 01/01/2023 CLINICAL DATA:  Shortness of breath EXAM: CHEST - 2 VIEW COMPARISON:  CXR 06/15/12 FINDINGS: New small bilateral pleural effusions, left greater than right. Hazy bibasilar airspace opacities may represent superimposed atelectasis, but infection is not  excluded. Borderline cardiomegaly. No radiographically apparent displaced rib fractures. Visualized upper abdomen is unremarkable. Degenerative changes of the bilateral glenohumeral and AC joints. IMPRESSION: 1. New small bilateral pleural effusions, left greater than right. 2. Hazy bibasilar airspace opacities may represent superimposed atelectasis, but infection is not excluded. Electronically Signed   By: Lorenza Cambridge M.D.   On: 01/01/2023 11:49    ECHO 09/22/2021 DOPPLER ECHO and OTHER SPECIAL PROCEDURES                 Aortic: TRIVIAL AR                 No AS                         98.0 cm/sec peak vel       3.8 mmHg peak grad  2.3 mmHg mean grad         2.5 cm^2 by DOPPLER                 Mitral: MODERATE MR                No MS                         MV Inflow E Vel = 148.0 cm/sec      MV Annulus E'Vel = nm*                         E/E'Ratio = nm*              Tricuspid: MILD TR                    No TS                         269.9 cm/sec peak TR vel   32.2 mmHg peak RV pressure              Pulmonary: TRIVIAL PR                 No PS  _________________________________________________________________________________________  INTERPRETATION  MILD LV SYSTOLIC DYSFUNCTION (See above)  NORMAL RIGHT VENTRICULAR SYSTOLIC FUNCTION  MODERATE VALVULAR REGURGITATION (See above)  NO VALVULAR STENOSIS  AF WITH RVR PREVENTS TOTALLY ACCURATE ASSESSMENT OF EF, BUT PROBABLY EF ~ 45-50%  ABNORMAL SEPTAL MOTION D/T LBBB   TELEMETRY reviewed by me (LT) 01/05/2023 :AF rate 100s-110s  EKG reviewed by me: AF LBBB rate 108   Data reviewed by me (LT) 01/05/2023: nursing notes, hospitalist progress ntoe last 24h vitals tele labs imaging I/O   Principal Problem:   Dyspnea    ASSESSMENT AND PLAN:  Prince Rome is an 33yoM with a PMH of HFmrEF (45-50% 09/2021), chronic LBBB, chronic atrial fibrillation (warfarin), DM2, HTN, memory impairment, who presented to [facility] ED 01/01/2023  with shortness of breath and peripheral edema worsening over 2-3 weeks.  Cardiology is consulted for assistance with his heart failure.  # Acute on chronic HFrEF # Chronic LBBB Presents with peripheral edema and dyspnea on exertion worsening for at least a week per the patient's neighbor at bedside.  BNP markedly elevated at 2400 and chest x-ray with bilateral pleural effusions.  Echo with low EF 25-30%. Remains clinically hypervolemic but with overall improvement since admission, weight down 12lbs since admission  -S/p IV Lasix 60 mg x 1 and 40 IV x 2 - continue lasix gtt at /hr - add metolazone 2.5mg  x 1 today. - likely discharge on torsemide  daily  - start low dose spiro 12.5mg  daily  -Continue GDMT with metoprolol.   Consider addition of ACE/ARB/Entresto, SGLT2i, as BP and renal function allow. -Strict I's/O, salt restriction - will need close outpatient follow up for management of his cardiomyopathy. With underlying memory impairment and poor medical literacy, unclear of his candidacy for CRT-D - ? Possible Life[facility] before discharge  - anticipate 1-2 more days of diuresis before discharge   # chronic atrial fibrillation -continuous tele while inpatient  -consolidate metoprolol to  BID -Continue warfarin per pharmacy protocol for goal INR 2.0-3.0.  INR subtherapeutic on admission at 1.3 -Reportedly could not afford DOAC in the past -Monitor and replenish electrolytes.  Goal K >4, mag >2  # CKD  3 Baseline from 08/2021 was a creatinine of 1.6 and GFR 42.  Creatinine stable with diuresis from 1.81--1.73 --1.74 -- 1.81 and current GFR of 37.  Monitor closely.  # Demand ischemia Troponin borderline elevated and flat trending at 52, 52, in the absence of chest pain acute decompensated heart failure, this is most consistent with demand/supply mismatch and not ACS   This patient's plan of care was discussed and created with Dr. Juliann Pares and he is in agreement.  Signed: Rebeca Allegra , PA-C 01/05/2023, 9:08 AM [facility] Cardiology

## 2023-01-05 NOTE — Progress Notes (Signed)
Occupational Therapy Treatment Patient Details Name: Bob Brewer MRN: [id number] DOB: September 28, 1940 Today's Date: 01/05/2023   History of present illness Pt is an 82 y.o. male presenting to hospital 01/01/23 with c/o SOB (worsening past week); worse with exertion and laying flat; swelling noted to LE's.  Pt admitted with acute CHF exacerbation.  PMH includes chronic a-fib on warfarin, mild dementia, DM, htn, back surgery, memory loss, chronic LBBB.   OT comments  Pt received semi-reclined in bed. Appearing alert; willing to work with OT on grooming and t/f to chair. T/f sit to stand SBA with RW and cues. See flowsheet below for further details of session. Left seated in recliner, chair alarm on, with all needs in reach.  Patient will benefit from continued OT while in acute care.    Recommendations for follow up therapy are one component of a multi-disciplinary discharge planning process, led by the attending physician.  Recommendations may be updated based on patient status, additional functional criteria and insurance authorization.    Assistance Recommended at Discharge Frequent or constant Supervision/Assistance  Patient can return home with the following  A little help with walking and/or transfers;A lot of help with bathing/dressing/bathroom;Assistance with cooking/housework;Assist for transportation;Help with stairs or ramp for entrance   Equipment Recommendations  BSC/3in1    Recommendations for Other Services      Precautions / Restrictions Precautions Precautions: Fall Restrictions Weight Bearing Restrictions: No       Mobility Bed Mobility Overal bed mobility: Modified Independent                  Transfers Overall transfer level: Needs assistance Equipment used: Rolling walker (2 wheels) Transfers: Sit to/from Stand Sit to Stand: Supervision, Min guard           General transfer comment: cues for hand placement for sit to stand at RW     Balance  Overall balance assessment: Mild deficits observed, not formally tested                                         ADL either performed or assessed with clinical judgement   ADL Overall ADL's : Needs assistance/impaired     Grooming: Wash/dry hands;Oral care;Min guard;Standing Grooming Details (indicate cue type and reason): stood at sink x10 minutes; pt able to brush teeth and enjoyed it so much that he did it twice.                             Functional mobility during ADLs: Min guard;Minimal assistance;Rolling walker (2 wheels)      Extremity/Trunk Assessment Upper Extremity Assessment Upper Extremity Assessment: Generalized weakness   Lower Extremity Assessment Lower Extremity Assessment: Defer to PT evaluation        Vision       Perception     Praxis      Cognition Arousal/Alertness: Awake/alert Behavior During Therapy: WFL for tasks assessed/performed Overall Cognitive Status: Impaired/Different from baseline                                 General Comments: Needs cues for safety. Demonstrating memory deficits (stating that his L foot just started hurting, when actually notes yesterday indicate foot pain)        Exercises  Shoulder Instructions       General Comments Pt on 2L O2 throughout session; O2 saturation 95%. Pt frustrated with needing O2 nasal cannula. Needed cues to adjust, as one of the nostrils was in when OT arrived. Pt able to walk approx 15 feet from sink around the bed to recliner chair with CGA.    Pertinent Vitals/ Pain       Pain Assessment Pain Assessment: 0-10 Pain Score:  (unrated) Pain Location: L foot (bottom middle) Pain Descriptors / Indicators: Tender, Sore Pain Intervention(s): Limited activity within patient's tolerance  Home Living                                          Prior Functioning/Environment              Frequency  Min 2X/week         Progress Toward Goals  OT Goals(current goals can now be found in the care plan section)  Progress towards OT goals: Progressing toward goals  Acute Rehab OT Goals Patient Stated Goal: Go home OT Goal Formulation: With patient Time For Goal Achievement: 01/18/23 Potential to Achieve Goals: Fair ADL Goals Pt Will Perform Grooming: with modified independence;standing Pt Will Perform Lower Body Dressing: with supervision;sit to/from stand Pt Will Transfer to Toilet: with supervision;ambulating Pt Will Perform Toileting - Clothing Manipulation and hygiene: with supervision;sit to/from stand  Plan Discharge plan needs to be updated    Co-evaluation                 AM-PAC OT "6 Clicks" Daily Activity     Outcome Measure   Help from another person eating meals?: None Help from another person taking care of personal grooming?: A Little Help from another person toileting, which includes using toliet, bedpan, or urinal?: A Lot Help from another person bathing (including washing, rinsing, drying)?: A Lot Help from another person to put on and taking off regular upper body clothing?: A Little Help from another person to put on and taking off regular lower body clothing?: A Lot 6 Click Score: 16    End of Session Equipment Utilized During Treatment: Rolling walker (2 wheels);Gait belt  OT Visit Diagnosis: Unsteadiness on feet (R26.81);Repeated falls (R29.6);Muscle weakness (generalized) (M62.81)   Activity Tolerance Patient tolerated treatment well   Patient Left in chair;with call bell/phone within reach;with chair alarm set   Nurse Communication Mobility status        Time: 1041-1106 OT Time Calculation (min): 25 min  Charges: OT General Charges $OT Visit: 1 Visit OT Treatments $Self Care/Home Management : 23-37 mins  Linward Foster, MS, OTR/L   Alvester Morin 01/05/2023, 1:04 PM

## 2023-01-06 DIAGNOSIS — R0600 Dyspnea, unspecified: Secondary | ICD-10-CM | POA: Diagnosis not present

## 2023-01-06 DIAGNOSIS — I509 Heart failure, unspecified: Secondary | ICD-10-CM | POA: Diagnosis not present

## 2023-01-06 DIAGNOSIS — F0390 Unspecified dementia without behavioral disturbance: Secondary | ICD-10-CM | POA: Diagnosis not present

## 2023-01-06 DIAGNOSIS — I482 Chronic atrial fibrillation, unspecified: Secondary | ICD-10-CM | POA: Diagnosis not present

## 2023-01-06 DIAGNOSIS — I5043 Acute on chronic combined systolic (congestive) and diastolic (congestive) heart failure: Secondary | ICD-10-CM | POA: Diagnosis not present

## 2023-01-06 LAB — BASIC METABOLIC PANEL
Anion gap: 14 (ref 5–15)
BUN: 39 mg/dL — ABNORMAL HIGH (ref 8–23)
CO2: 29 mmol/L (ref 22–32)
Calcium: 8.9 mg/dL (ref 8.9–10.3)
Chloride: 93 mmol/L — ABNORMAL LOW (ref 98–111)
Creatinine, Ser: 1.89 mg/dL — ABNORMAL HIGH (ref 0.61–1.24)
GFR, Estimated: 35 mL/min — ABNORMAL LOW (ref >60)
Glucose, Bld: 196 mg/dL — ABNORMAL HIGH (ref 70–99)
Potassium: 3.4 mmol/L — ABNORMAL LOW (ref 3.5–5.1)
Sodium: 136 mmol/L (ref 135–145)

## 2023-01-06 LAB — CBC
HCT: 40.5 % (ref 39.0–52.0)
Hemoglobin: 13.1 g/dL (ref 13.0–17.0)
MCH: 29.8 pg (ref 26.0–34.0)
MCHC: 32.3 g/dL (ref 30.0–36.0)
MCV: 92 fL (ref 80.0–100.0)
Platelets: 254 10*3/uL (ref 150–400)
RBC: 4.4 MIL/uL (ref 4.22–5.81)
RDW: 13.2 % (ref 11.5–15.5)
WBC: 10.3 10*3/uL (ref 4.0–10.5)
nRBC: 0 % (ref 0.0–0.2)

## 2023-01-06 LAB — PROTIME-INR
INR: 2.4 — ABNORMAL HIGH (ref 0.8–1.2)
Prothrombin Time: 25.8 seconds — ABNORMAL HIGH (ref 11.4–15.2)

## 2023-01-06 LAB — MAGNESIUM: Magnesium: 1.6 mg/dL — ABNORMAL LOW (ref 1.7–2.4)

## 2023-01-06 MED ORDER — MAGNESIUM SULFATE 2 GM/50ML IV SOLN
2.0000 g | Freq: Once | INTRAVENOUS | Status: AC
  Administered 2023-01-06: 2 g via INTRAVENOUS
  Filled 2023-01-06: qty 50

## 2023-01-06 MED ORDER — WARFARIN SODIUM 7.5 MG PO TABS
7.5000 mg | ORAL_TABLET | Freq: Once | ORAL | Status: AC
  Administered 2023-01-06: 7.5 mg via ORAL
  Filled 2023-01-06: qty 1

## 2023-01-06 MED ORDER — LOSARTAN POTASSIUM 25 MG PO TABS
25.0000 mg | ORAL_TABLET | Freq: Every day | ORAL | Status: DC
  Administered 2023-01-06: 25 mg via ORAL
  Filled 2023-01-06 (×2): qty 1

## 2023-01-06 MED ORDER — POTASSIUM CHLORIDE CRYS ER 20 MEQ PO TBCR
20.0000 meq | EXTENDED_RELEASE_TABLET | Freq: Once | ORAL | Status: AC
  Administered 2023-01-06: 20 meq via ORAL
  Filled 2023-01-06: qty 1

## 2023-01-06 MED ORDER — TORSEMIDE 20 MG PO TABS
20.0000 mg | ORAL_TABLET | Freq: Every day | ORAL | Status: DC
  Administered 2023-01-06 – 2023-01-07 (×2): 20 mg via ORAL
  Filled 2023-01-06 (×2): qty 1

## 2023-01-06 MED ORDER — METOPROLOL SUCCINATE ER 25 MG PO TB24
25.0000 mg | ORAL_TABLET | Freq: Every day | ORAL | Status: DC
  Administered 2023-01-06 – 2023-01-07 (×2): 25 mg via ORAL
  Filled 2023-01-06 (×2): qty 1

## 2023-01-06 NOTE — Progress Notes (Signed)
SUBJECTIVE: Patient is feeling much better denies any chest pain or shortness of breath   Vitals:   01/05/23 2018 01/05/23 2250 01/06/23 0409 01/06/23 0731  BP: 98/61 122/89 107/81 (!) 110/91  Pulse: 84 (!) 108 (!) 57 94  Resp: Temp: 99.8 F (37.7 C) 98.6 F (37 C) 98.1 F (36.7 C) 97.8 F (36.6 C)  TempSrc: Oral Oral  Oral  SpO2: 100% 100% 100% 100%  Weight:   72.8 kg   Height:        Intake/Output Summary (Last 24 hours) at 01/06/2023 1120 Last data filed at 01/06/2023 1000 Gross per 24 hour  Intake 628.84 ml  Output 2750 ml  Net -2121.16 ml    LABS: Basic Metabolic Panel: Recent Labs    01/05/23 0357 01/06/23 0442  NA 137 136  K 3.5 3.4*  CL 101 93*  CO2 26 29  GLUCOSE 176* 196*  BUN 39* 39*  CREATININE 1.68* 1.89*  CALCIUM 8.4* 8.9  MG 2.5* 1.6*   Liver Function Tests: No results for input(s): "AST", "ALT", "ALKPHOS", "BILITOT", "PROT", "ALBUMIN" in the last 72 hours. No results for input(s): "LIPASE", "AMYLASE" in the last 72 hours. CBC: Recent Labs    01/05/23 0357 01/06/23 0442  WBC 8.8 10.3  HGB 12.0* 13.1  HCT 37.2* 40.5  MCV 91.4 92.0  PLT 234 254   Cardiac Enzymes: No results for input(s): "CKTOTAL", "CKMB", "CKMBINDEX", "TROPONINI" in the last 72 hours. BNP: Invalid input(s): "POCBNP" D-Dimer: No results for input(s): "DDIMER" in the last 72 hours. Hemoglobin A1C: No results for input(s): "HGBA1C" in the last 72 hours. Fasting Lipid Panel: No results for input(s): "CHOL", "HDL", "LDLCALC", "TRIG", "CHOLHDL", "LDLDIRECT" in the last 72 hours. Thyroid Function Tests: No results for input(s): "TSH", "T4TOTAL", "T3FREE", "THYROIDAB" in the last 72 hours.  Invalid input(s): "FREET3" Anemia Panel: No results for input(s): "VITAMINB12", "FOLATE", "FERRITIN", "TIBC", "IRON", "RETICCTPCT" in the last 72 hours.   PHYSICAL EXAM General: Well developed, well nourished, in no acute distress HEENT:  Normocephalic and  atramatic Neck:  No JVD.  Lungs: Clear bilaterally to auscultation and percussion. Heart: HRRR . Normal S1 and S2 without gallops or murmurs.  Abdomen: Bowel sounds are positive, abdomen soft and non-tender  Msk:  Back normal, normal gait. Normal strength and tone for age. Extremities: No clubbing, cyanosis or edema.   Neuro: Alert and oriented X 3. Psych:  Good affect, responds appropriately  TELEMETRY: Atrial fibrillation with controlled ventricular rate  ASSESSMENT AND PLAN: #1 congestive heart failure with severe LV dysfunction on echocardiogram showing 25 to 30% does have HFrEF.  Patient however is feeling much better today denies any shortness of breath and lungs are clear.  Advised changing IV Lasix drip to torsemide 20 mg once a day continue Aldactone 12.5 once a day.  Change metoprolol tartrate to metoprolol succinate 25 mg once a day and add losartan 25 mg once a day.  May consider Entresto later on once blood pressure is better. #2 atrial fibrillation with controlled ventricular rate which appears to be permanent.  Thus continue metoprolol and Coumadin.  May plan on discharging the patient soon.   ICD-10-CM   1. Shortness of breath  R06.02     2. Peripheral edema  R60.0       Principal Problem:   Dyspnea    Adrian Blackwater, MD, [facility] 01/06/2023 11:20 AM

## 2023-01-06 NOTE — Progress Notes (Addendum)
PROGRESS NOTE    Bob Brewer  QMV:[id number] DOB: Nov 22, 1940 DOA: 01/01/2023 PCP: Corky Downs, MD   Assessment & Plan:   Principal Problem:   Dyspnea  Assessment and Plan: Acute on chronic combined CHF exacerbation: dyspnea, orthopnea, BNP 2466, CXR with new bilateral pleural effusion, and BLE edema. Continue on IV lasix drip & continue on metoprolol. Monitor I/Os  Chronic a. fib: continue on wafarin, metoprolol. Pharmacy to dose & monitor warfarin    DM2: HbA1c 7.7, poorly controlled. Continue on SSI w/ accuchecks   Dementia: continue on aricept, memantine.Continue w/ supportive care   HTN: holding home dose of lisinopril. BP is WNL currently   CKDIIIb: Cr is labile. Will monitor closely while lasix drip    Hypomagnesemia: magnesium sulfate ordered    Demand ischemia: troponins are minimally elevated. This is most consistent with demand/supply mismatch and not ACS        DVT prophylaxis: warfarin Code Status: full  Family Communication:  Disposition Plan: likely d/c back home   Level of care: Telemetry Cardiac Status is: Inpatient Remains inpatient appropriate because: severity of illness, requiring IV lasix drip    Consultants:  Cardio   Procedures:   Antimicrobials  Subjective: Pt c/o fatigue and wanting to go home   Objective: Vitals:   01/05/23 2018 01/05/23 2250 01/06/23 0409 01/06/23 0731  BP: 98/61 122/89 107/81 (!) 110/91  Pulse: 84 (!) 108 (!) 57 94  Resp: Temp: 99.8 F (37.7 C) 98.6 F (37 C) 98.1 F (36.7 C) 97.8 F (36.6 C)  TempSrc: Oral Oral  Oral  SpO2: 100% 100% 100% 100%  Weight:   72.8 kg   Height:        Intake/Output Summary (Last 24 hours) at 01/06/2023 0832 Last data filed at 01/06/2023 0409 Gross per 24 hour  Intake 828.84 ml  Output 1900 ml  Net -1071.16 ml   Filed Weights   01/04/23 0900 01/05/23 0335 01/06/23 0409  Weight: 74.9 kg 75 kg 72.8 kg    Examination:  General exam:  Appears  comfortable  Respiratory system: diminished breath sounds b/l  Cardiovascular system: irregularly irregular  Gastrointestinal system: abd is soft, NT, ND & hypoactive bowel sounds Central nervous system:  Alert and oriented. Moves all extremities  Psychiatry: judgement and insight appears poor. Flat mood and affect    Data Reviewed: I have personally reviewed following labs and imaging studies  CBC: Recent Labs  Lab 01/02/23 0449 01/03/23 0530 01/04/23 0518 01/05/23 0357 01/06/23 0442  WBC 10.1 8.1 8.9 8.8 10.3  HGB 11.6* 10.8* 11.3* 12.0* 13.1  HCT 36.8* 33.0* 35.1* 37.2* 40.5  MCV 95.3 91.7 91.6 91.4 92.0  PLT 254 243 231 234 254   Basic Metabolic Panel: Recent Labs  Lab 01/02/23 0449 01/03/23 0530 01/04/23 0518 01/05/23 0357 01/06/23 0442  NA 140 136 138 137 136  K 4.4 4.5 4.0 3.5 3.4*  CL 107 104 103 101 93*  CO2 GLUCOSE 160* 155* 162* 176* 196*  BUN 42* 40* 40* 39* 39*  CREATININE 1.73* 1.74* 1.81* 1.68* 1.89*  CALCIUM 8.7* 8.7* 8.6* 8.4* 8.9  MG 1.4* 1.8 1.8 2.5* 1.6*   GFR: Estimated Creatinine Clearance: 28 mL/min (A) (by C-G formula based on SCr of 1.89 mg/dL (H)). Liver Function Tests: No results for input(s): "AST", "ALT", "ALKPHOS", "BILITOT", "PROT", "ALBUMIN" in the last 168 hours. No results for input(s): "LIPASE", "AMYLASE" in the last 168 hours. No  results for input(s): "AMMONIA" in the last 168 hours. Coagulation Profile: Recent Labs  Lab 01/02/23 0449 01/03/23 0530 01/04/23 0518 01/05/23 0357 01/06/23 0442  INR 1.4* 1.5* 1.7* 1.8* 2.4*   Cardiac Enzymes: No results for input(s): "CKTOTAL", "CKMB", "CKMBINDEX", "TROPONINI" in the last 168 hours. BNP (last 3 results) No results for input(s): "PROBNP" in the last 8760 hours. HbA1C: No results for input(s): "HGBA1C" in the last 72 hours.  CBG: No results for input(s): "GLUCAP" in the last 168 hours. Lipid Profile: No results for input(s): "CHOL", "HDL", "LDLCALC",  "TRIG", "CHOLHDL", "LDLDIRECT" in the last 72 hours. Thyroid Function Tests: No results for input(s): "TSH", "T4TOTAL", "FREET4", "T3FREE", "THYROIDAB" in the last 72 hours. Anemia Panel: No results for input(s): "VITAMINB12", "FOLATE", "FERRITIN", "TIBC", "IRON", "RETICCTPCT" in the last 72 hours. Sepsis Labs: No results for input(s): "PROCALCITON", "LATICACIDVEN" in the last 168 hours.  Recent Results (from the past 240 hour(s))  SARS Coronavirus 2 by RT PCR (hospital order, performed in [place] hospital lab) *cepheid single result test* Anterior Nasal Swab     Status: None   Collection Time: 01/01/23  5:49 PM   Specimen: Anterior Nasal Swab  Result Value Ref Range Status   SARS Coronavirus 2 by RT PCR NEGATIVE NEGATIVE Final    Comment: (NOTE) SARS-CoV-2 target nucleic acids are NOT DETECTED.  The SARS-CoV-2 RNA is generally detectable in upper and lower respiratory specimens during the acute phase of infection. The lowest concentration of SARS-CoV-2 viral copies this assay can detect is 250 copies / mL. A negative result does not preclude SARS-CoV-2 infection and should not be used as the sole basis for treatment or other patient management decisions.  A negative result may occur with improper specimen collection / handling, submission of specimen other than nasopharyngeal swab, presence of viral mutation(s) within the areas targeted by this assay, and inadequate number of viral copies (<250 copies / mL). A negative result must be combined with clinical observations, patient history, and epidemiological information.  Fact Sheet for Patients:   RoadLapTop.co.za  Fact Sheet for Healthcare Providers: http://kim-miller.com/  This test is not yet approved or  cleared by the [place] FDA and has been authorized for detection and/or diagnosis of SARS-CoV-2 by FDA under an Emergency Use Authorization (EUA).  This EUA will  remain in effect (meaning this test can be used) for the duration of the COVID-19 declaration under Section 564(b)(1) of the Act, 21 U.S.C. section 360bbb-3(b)(1), unless the authorization is terminated or revoked sooner.  Performed at [facility], [address] [place], [place] [address]          Radiology Studies: No results found.      Scheduled Meds:  donepezil  10 mg Oral Daily   finasteride  5 mg Oral Daily   memantine  10 mg Oral BID   metoprolol tartrate  25 mg Oral BID   pantoprazole  40 mg Oral Daily   potassium chloride  20 mEq Oral Once   spironolactone  12.5 mg Oral Daily   tamsulosin  0.4 mg Oral Daily   Warfarin - Pharmacist Dosing Inpatient   Does not apply q1600   Continuous Infusions:  furosemide (LASIX) 200 mg in dextrose 5 % 100 mL (2 mg/mL) infusion 10 mg/hr (01/06/23 0014)     LOS: 4 days    Time spent: 25 mins     Charise Killian, MD [facility] Pager [phone]  If 7PM-7AM, please contact night-coverage [email or web address] 01/06/2023, 8:32  AM  

## 2023-01-06 NOTE — Progress Notes (Signed)
ANTICOAGULATION CONSULT NOTE -   Pharmacy Consult for warfarin Indication: atrial fibrillation  No Known Allergies  Patient Measurements: Height:  (162.6 cm) Weight: 72.8 kg (160 lb 7.9 oz) IBW/kg (Calculated) : 59.2  Vital Signs: Temp: 97.8 F (36.6 C) (04/20 0731) Temp Source: Oral (04/20 0731) BP: 110/91 (04/20 0731) Pulse Rate: 94 (04/20 0731)  Labs: Recent Labs    01/04/23 0518 01/05/23 0357 01/06/23 0442  HGB 11.3* 12.0* 13.1  HCT 35.1* 37.2* 40.5  PLT 231 234 254  LABPROT 19.7* 20.7* 25.8*  INR 1.7* 1.8* 2.4*  CREATININE 1.81* 1.68* 1.89*     Estimated Creatinine Clearance: 28 mL/min (A) (by C-G formula based on SCr of 1.89 mg/dL (H)).   Medical History: Past Medical History:  Diagnosis Date   Diabetes mellitus without complication    Hypertension    Memory loss     Medications:  Warfarin 5 mg Monday-Friday, Warfarin 7.5 mg Saturday and Sunday (40 mg /week).  Assessment: 82 year old male PMH atrial fibrillation on warfarin (Chadsvasc at least 4), T2DM, HTN admitted with CHF exacerbation.    No DDI's identified   Goal of Therapy:  INR 2-3 Monitor platelets by anticoagulation protocol: Yes   Date INR Warfarin dose/comments  4/15 1.3  2.5mg  x 1  (plus  PTA)  4/16 1.4 7.5 mg  4/17 1.5 7.5 mg  4/18 1.7 7.5 mg  4/19 1.8 10 mg  4/20 2.4            Plan: INR therapeutic Will order warfarin 7.5 mg x1 dose today and continue daily INR with AM labs.   Angelique Blonder, PharmD Clinical Pharmacist 01/06/2023 10:03 AM

## 2023-01-06 NOTE — Progress Notes (Signed)
Patient's heart rate fluctuating in between 120-140, no symptoms of chest pain, SOB, sweating, patient is asymptomatic, scheduled Tab. Metoprolol given, MD made aware of, Tab Metoprolol and Tab. Losartan ordered for later.

## 2023-01-07 DIAGNOSIS — I482 Chronic atrial fibrillation, unspecified: Secondary | ICD-10-CM | POA: Diagnosis not present

## 2023-01-07 DIAGNOSIS — I509 Heart failure, unspecified: Secondary | ICD-10-CM | POA: Diagnosis not present

## 2023-01-07 DIAGNOSIS — I5043 Acute on chronic combined systolic (congestive) and diastolic (congestive) heart failure: Secondary | ICD-10-CM | POA: Diagnosis not present

## 2023-01-07 DIAGNOSIS — R0600 Dyspnea, unspecified: Secondary | ICD-10-CM | POA: Diagnosis not present

## 2023-01-07 DIAGNOSIS — F0390 Unspecified dementia without behavioral disturbance: Secondary | ICD-10-CM | POA: Diagnosis not present

## 2023-01-07 LAB — PROTIME-INR
INR: 3 — ABNORMAL HIGH (ref 0.8–1.2)
Prothrombin Time: 30.8 seconds — ABNORMAL HIGH (ref 11.4–15.2)

## 2023-01-07 MED ORDER — TORSEMIDE 20 MG PO TABS: 20.0000 mg | ORAL_TABLET | Freq: Every day | ORAL | 0 refills | Status: DC

## 2023-01-07 MED ORDER — POTASSIUM CHLORIDE CRYS ER 20 MEQ PO TBCR
20.0000 meq | EXTENDED_RELEASE_TABLET | Freq: Once | ORAL | Status: AC
  Administered 2023-01-07: 20 meq via ORAL
  Filled 2023-01-07: qty 1

## 2023-01-07 MED ORDER — WARFARIN SODIUM 6 MG PO TABS
6.0000 mg | ORAL_TABLET | Freq: Once | ORAL | Status: DC
  Filled 2023-01-07: qty 1

## 2023-01-07 NOTE — Progress Notes (Signed)
ANTICOAGULATION CONSULT NOTE -   Pharmacy Consult for warfarin Indication: atrial fibrillation  No Known Allergies  Patient Measurements: Height:  (162.6 cm) Weight: 72.5 kg (159 lb 13.3 oz) IBW/kg (Calculated) : 59.2  Vital Signs: Temp: 98.1 F (36.7 C) (04/21 0323) BP: 91/57 (04/21 1034) Pulse Rate: 57 (04/21 0323)  Labs: Recent Labs    01/05/23 0357 01/06/23 0442 01/07/23 0515  HGB 12.0* 13.1  --   HCT 37.2* 40.5  --   PLT 234 254  --   LABPROT 20.7* 25.8* 30.8*  INR 1.8* 2.4* 3.0*  CREATININE 1.68* 1.89*  --      Estimated Creatinine Clearance: 28 mL/min (A) (by C-G formula based on SCr of 1.89 mg/dL (H)).   Medical History: Past Medical History:  Diagnosis Date   Diabetes mellitus without complication    Hypertension    Memory loss     Medications:  Warfarin 5 mg Monday-Friday, Warfarin 7.5 mg Saturday and Sunday (40 mg /week).  Assessment: 82 year old male PMH atrial fibrillation on warfarin (Chadsvasc at least 4), T2DM, HTN admitted with CHF exacerbation.    No DDI's identified -lasix drip changed torsemide on 4/20  Goal of Therapy:  INR 2-3 Monitor platelets by anticoagulation protocol: Yes   Date INR Warfarin dose/comments  4/15 1.3  2.5mg  x 1  (plus  PTA)  4/16 1.4 7.5 mg  4/17 1.5 7.5 mg  4/18 1.7 7.5 mg  4/19 1.8 10 mg  4/20 2.4 7.5 mg  4/21 3.0        Plan: INR therapeutic (upper end- now seeing effect from  dose on 4/19) Will order warfarin 6 mg x1 dose today( in case torsemide contributing to INR increase also) and continue daily INR with AM labs.   Angelique Blonder, PharmD Clinical Pharmacist 01/07/2023 10:42 AM

## 2023-01-07 NOTE — Discharge Summary (Addendum)
Physician Discharge Summary  Bob Brewer:[id number] DOB: 19-Oct-1940 DOA: 01/01/2023  PCP: Corky Downs, MD  Admit date: 01/01/2023 Discharge date: 01/07/2023  Admitted From: home  Disposition:  home w/ home health   Recommendations for Outpatient Follow-up:  Follow up with PCP in 1-2 weeks F/u w/ [facility] cardio w/in 1 week  Home Health: yes  Equipment/Devices:  Discharge Condition: stable CODE STATUS: full  Diet recommendation: Heart Healthy / Carb Modified  Brief/Interim Summary: HPI was taken from Dr. Fran Lowes: Bob Brewer is a 82 y.o. male with medical history significant of chronic afib on warfarin, mild dementia, DM2 and HTN who was sent from clinic for evaluation of dyspnea and swelling.   Pt reported dyspnea, orthopnea and BLE swelling for the past 2-3 weeks.  No cough, recent illness, fever, chest pain, abdominal pain, N/V/D, dysuria.  Pt reported more difficulty walking due to the leg swelling and dyspnea.  Pt denied excess salt intake.     ED Course: pt was in Afb with rate of 100's.  Sating well on room air.  Labs notable for Cr 1.81, BNP 2466, trop 52 and 52.  CXR showed New small bilateral pleural effusions, left greater than right.  Pt was given IV lasix 60 mg x1 in the ED and admitted for observation.    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 D/c lasix drip and started on torsemide & continue on metoprolol as per cardio. Monitor I/Os. Pt was cleared by cardio, Dr. Milta Deiters, on 01/07/23 for d/c  Chronic a. fib: continue on wafarin, metoprolol. Pharmacy to dose & monitor warfarin    DM2: HbA1c 7.7, poorly controlled. Continue on SSI w/ accuchecks   Dementia: continue on aricept, memantine.Continue w/ supportive care   HTN: restart lisinopril.   CKDIIIb: Cr is labile.   Hypomagnesemia: repleated     Demand  ischemia: troponins are minimally elevated. This is most consistent with demand/supply mismatch and not ACS   Discharge Instructions  Discharge Instructions     Diet - low sodium heart healthy   Complete by: As directed    Diet Carb Modified   Complete by: As directed    Discharge instructions   Complete by: As directed    F/u w/ [facility] within 1 week. F/u w/ PCP in 1-2 weeks   Increase activity slowly   Complete by: As directed       Allergies as of 01/07/2023   No Known Allergies      Medication List     STOP taking these medications    furosemide 20 MG tablet Commonly known as: LASIX   furosemide 40 MG tablet Commonly known as: LASIX   meloxicam 15 MG tablet Commonly known as: MOBIC       TAKE these medications    Alcohol Prep Pads 1 each by Does not apply route daily.   B-D TB SYRINGE 1CC/27GX1/2" 27G X 1/2" 1 ML Misc Generic drug: TUBERCULIN SYR 1CC/27GX1/2" USE AS DIRECTED WITH  CYANOCOBALAMIN   cyanocobalamin 1000 MCG/ML injection Commonly known as: VITAMIN B12 Inject 1 ml subcutaneously once a month   donepezil 10 MG tablet Commonly known as: ARICEPT Take 1 tablet (10 mg total) by mouth daily.   finasteride 5 MG tablet Commonly known as: PROSCAR TAKE 1 TABLET BY MOUTH  DAILY   Lancets 33G Misc Check blood  sugar 2 times daily   lisinopril 40 MG tablet Commonly known as: ZESTRIL TAKE 1 TABLET BY MOUTH DAILY   memantine 10 MG tablet Commonly known as: NAMENDA Take 1 tablet (10 mg total) by mouth 2 (two) times daily. Please call and make overdue appt for further refills, 1st attempt   metFORMIN 500 MG tablet Commonly known as: GLUCOPHAGE TAKE 1 TABLET BY MOUTH TWICE  DAILY WITH MEALS   metoprolol succinate 25 MG 24 hr tablet Commonly known as: TOPROL-XL TAKE 1 TABLET BY MOUTH DAILY   omeprazole 20 MG capsule Commonly known as: PRILOSEC TAKE 1 CAPSULE BY MOUTH DAILY   pravastatin 40 MG tablet Commonly known as: PRAVACHOL TAKE 1  TABLET BY MOUTH DAILY   ReliOn True Metrix Test Strips test strip Generic drug: glucose blood Check sugar 2 times daily   spironolactone 25 MG tablet Commonly known as: ALDACTONE TAKE 1 TABLET BY MOUTH DAILY   tamsulosin 0.4 MG Caps capsule Commonly known as: FLOMAX TAKE 1 CAPSULE BY MOUTH  DAILY   torsemide 20 MG tablet Commonly known as: DEMADEX Take 1 tablet (20 mg total) by mouth daily. Start taking on: January 08, 2023   True Metrix Air Glucose Meter [place] Use to check blood sugar 2 times daily   VITAMIN D PO Take 2,000 Units by mouth daily.   warfarin 2.5 MG tablet Commonly known as: Coumadin Take 1 tablet (2.5 mg total) by mouth 2 (two) times a week. Take 2.5mg  in addition to the 5mg  on Saturday and Sunday   warfarin 5 MG tablet Commonly known as: COUMADIN Take 1 tablet (5 mg total) by mouth daily. Take 5 mg Monday - Friday               Durable Medical Equipment  (From admission, onward)           Start     Ordered   01/04/23 1534  For home use only DME Walker rolling  Once       Question Answer Comment  Walker: With 5 Inch Wheels   Patient needs a walker to treat with the following condition Other abnormalities of gait and mobility      01/04/23 1533            No Known Allergies  Consultations: [facility] cardio    Procedures/Studies: ECHOCARDIOGRAM COMPLETE  Result Date: 01/02/2023    ECHOCARDIOGRAM REPORT   Patient Name:   Bob Brewer Date of Exam: 01/02/2023 Medical Rec #:  [id number]       Height:       64.0 in Accession #:    [id number]      Weight:       177.0 lb Date of Birth:  12/19/1940       BSA:          1.857 m Patient Age:    82 years        BP:           98/63 mmHg Patient Gender: M               HR:           11 5 bpm. Exam Location:  [facility] Procedure: 2D Echo, Cardiac Doppler, Color Doppler and Strain Analysis Indications:     Dyspnea R06.00  History:         Patient has no prior history of Echocardiogram examinations.  Risk Factors:Diabetes and Hypertension.  Sonographer:     Cristela Blue Referring Phys:  [id number] Inetta Fermo LAI Diagnosing Phys: Alwyn Pea MD  Sonographer Comments: Global longitudinal strain was attempted. IMPRESSIONS  1. Left ventricular ejection fraction, by estimation, is 25 to 30%. The left ventricle has severely decreased function. The left ventricle demonstrates global hypokinesis. The left ventricular internal cavity size was mildly to moderately dilated. Left ventricular diastolic parameters are consistent with Grade II diastolic dysfunction (pseudonormalization).  2. Right ventricular systolic function is low normal. The right ventricular size is mildly enlarged.  3. Left atrial size was mildly dilated.  4. Right atrial size was mildly dilated.  5. The mitral valve is normal in structure. Mild mitral valve regurgitation.  6. The aortic valve was not well visualized. Aortic valve regurgitation is trivial. FINDINGS  Left Ventricle: Left ventricular ejection fraction, by estimation, is 25 to 30%. The left ventricle has severely decreased function. The left ventricle demonstrates global hypokinesis. The left ventricular internal cavity size was mildly to moderately dilated. There is borderline left ventricular hypertrophy. Left ventricular diastolic parameters are consistent with Grade II diastolic dysfunction (pseudonormalization). Right Ventricle: The right ventricular size is mildly enlarged. No increase in right ventricular wall thickness. Right ventricular systolic function is low normal. Left Atrium: Left atrial size was mildly dilated. Right Atrium: Right atrial size was mildly dilated. Pericardium: There is no evidence of pericardial effusion. Mitral Valve: The mitral valve is normal in structure. Mild mitral valve regurgitation. Tricuspid Valve: The tricuspid valve is normal in structure. Tricuspid valve regurgitation is mild. Aortic Valve: The aortic valve was not well visualized. Aortic valve  regurgitation is trivial. Aortic valve mean gradient measures 1.0 mmHg. Aortic valve peak gradient measures 2.4 mmHg. Aortic valve area, by VTI measures 4.78 cm. Pulmonic Valve: The pulmonic valve was normal in structure. Pulmonic valve regurgitation is not visualized. Aorta: The ascending aorta was not well visualized. IAS/Shunts: No atrial level shunt detected by color flow Doppler.  LEFT VENTRICLE PLAX 2D LVIDd:         5.00 cm      Diastology LVIDs:         4.40 cm      LV e' medial:    5.44 cm/s LV PW:         1.10 cm      LV E/e' medial:  25.7 LV IVS:        1.30 cm      LV e' lateral:   7.83 cm/s LVOT diam:     2.00 cm      LV E/e' lateral: 17.9 LV SV:         35 LV SV Index:   19 LVOT Area:     3.14 cm                              3D Volume EF: LV Volumes (MOD)            3D EF:        30 % LV vol d, MOD A2C: 147.0 ml LV EDV:       222 ml LV vol d, MOD A4C: 165.0 ml LV ESV:       155 ml LV vol s, MOD A2C: 118.0 ml LV SV:        66 ml LV vol s, MOD A4C: 130.0 ml LV SV MOD A2C:     29.0 ml LV  SV MOD A4C:     165.0 ml LV SV MOD BP:      33.8 ml RIGHT VENTRICLE RV Basal diam:  4.60 cm RV Mid diam:    4.20 cm RV S prime:     5.22 cm/s LEFT ATRIUM           Index        RIGHT ATRIUM           Index LA diam:      4.00 cm 2.15 cm/m   RA Area:     10.90 cm LA Vol (A2C): 38.8 ml 20.89 ml/m  RA Volume:   19.80 ml  10.66 ml/m LA Vol (A4C): 70.5 ml 37.96 ml/m  AORTIC VALVE AV Area (Vmax):    2.86 cm AV Area (Vmean):   3.20 cm AV Area (VTI):     4.78 cm AV Vmax:           78.00 cm/s AV Vmean:          49.300 cm/s AV VTI:            0.073 m AV Peak Grad:      2.4 mmHg AV Mean Grad:      1.0 mmHg LVOT Vmax:         71.10 cm/s LVOT Vmean:        50.200 cm/s LVOT VTI:          0.111 m LVOT/AV VTI ratio: 1.52  AORTA Ao Root diam: 3.50 cm MITRAL VALVE                  TRICUSPID VALVE MV Area (PHT): 6.48 cm       TR Peak grad:   34.1 mmHg MV Decel Time: 117 msec       TR Vmax:        292.00 cm/s MR Peak grad:    71.2  mmHg MR Mean grad:    46.0 mmHg    SHUNTS MR Vmax:         422.00 cm/s  Systemic VTI:  0.11 m MR Vmean:        313.0 cm/s   Systemic Diam: 2.00 cm MR PISA:         6.28 cm MR PISA Eff ROA: 52 mm MR PISA Radius:  1.00 cm MV E velocity: 140.00 cm/s Alwyn Pea MD Electronically signed by Alwyn Pea MD Signature Date/Time: 01/02/2023/12:49:30 PM    Final    DG Chest 2 View  Result Date: 01/01/2023 CLINICAL DATA:  Shortness of breath EXAM: CHEST - 2 VIEW COMPARISON:  CXR 06/15/12 FINDINGS: New small bilateral pleural effusions, left greater than right. Hazy bibasilar airspace opacities may represent superimposed atelectasis, but infection is not excluded. Borderline cardiomegaly. No radiographically apparent displaced rib fractures. Visualized upper abdomen is unremarkable. Degenerative changes of the bilateral glenohumeral and AC joints. IMPRESSION: 1. New small bilateral pleural effusions, left greater than right. 2. Hazy bibasilar airspace opacities may represent superimposed atelectasis, but infection is not excluded. Electronically Signed   By: Lorenza Cambridge M.D.   On: 01/01/2023 11:49   (Echo, Carotid, EGD, Colonoscopy, ERCP)    Subjective: Pt denies any complaints. Pt denies shortness of breath or chest pain    Discharge Exam: Vitals:   01/07/23 1034 01/07/23 1211  BP: (!) 91/57 90/68  Pulse:  65  Resp:  16  Temp:  97.8 F (36.6 C)  SpO2:  96%   Vitals:   01/07/23 0323  01/07/23 0615 01/07/23 1034 01/07/23 1211  BP: 91/66  (!) 91/57 90/68  Pulse: (!) 57   65  Resp: 16   16  Temp: 98.1 F (36.7 C)   97.8 F (36.6 C)  TempSrc:      SpO2: 98%   96%  Weight:  72.5 kg    Height:        General: Pt is alert, awake, not in acute distress Cardiovascular:S1/S2 +, no rubs, no gallops Respiratory: CTA bilaterally, no wheezing, no rhonchi Abdominal: Soft, NT, ND, bowel sounds + Extremities: no edema, no cyanosis    The results of significant diagnostics from this  hospitalization (including imaging, microbiology, ancillary and laboratory) are listed below for reference.     Microbiology: Recent Results (from the past 240 hour(s))  SARS Coronavirus 2 by RT PCR (hospital order, performed in [facility] hospital lab) *cepheid single result test* Anterior Nasal Swab     Status: None   Collection Time: 01/01/23  5:49 PM   Specimen: Anterior Nasal Swab  Result Value Ref Range Status   SARS Coronavirus 2 by RT PCR NEGATIVE NEGATIVE Final    Comment: (NOTE) SARS-CoV-2 target nucleic acids are NOT DETECTED.  The SARS-CoV-2 RNA is generally detectable in upper and lower respiratory specimens during the acute phase of infection. The lowest concentration of SARS-CoV-2 viral copies this assay can detect is 250 copies / mL. A negative result does not preclude SARS-CoV-2 infection and should not be used as the sole basis for treatment or other patient management decisions.  A negative result may occur with improper specimen collection / handling, submission of specimen other than nasopharyngeal swab, presence of viral mutation(s) within the areas targeted by this assay, and inadequate number of viral copies (<250 copies / mL). A negative result must be combined with clinical observations, patient history, and epidemiological information.  Fact Sheet for Patients:   RoadLapTop.co.za  Fact Sheet for Healthcare Providers: http://kim-miller.com/  This test is not yet approved or  cleared by the [place] FDA and has been authorized for detection and/or diagnosis of SARS-CoV-2 by FDA under an Emergency Use Authorization (EUA).  This EUA will remain in effect (meaning this test can be used) for the duration of the COVID-19 declaration under Section 564(b)(1) of the Act, 21 U.S.C. section 360bbb-3(b)(1), unless the authorization is terminated or revoked sooner.  Performed at [facility], [address] [place], [place] [address]      Labs: BNP (last 3 results) Recent Labs    01/01/23 1116  BNP 2,466.0*   Basic Metabolic Panel: Recent Labs  Lab 01/02/23 0449 01/03/23 0530 01/04/23 0518 01/05/23 0357 01/06/23 0442  NA 140 136 138 137 136  K 4.4 4.5 4.0 3.5 3.4*  CL 107 104 103 101 93*  CO2 22 22 24 26 29   GLUCOSE 160* 155* 162* 176* 196*  BUN 42* 40* 40* 39* 39*  CREATININE 1.73* 1.74* 1.81* 1.68* 1.89*  CALCIUM 8.7* 8.7* 8.6* 8.4* 8.9  MG 1.4* 1.8 1.8 2.5* 1.6*   Liver Function Tests: No results for input(s): "AST", "ALT", "ALKPHOS", "BILITOT", "PROT", "ALBUMIN" in the last 168 hours. No results for input(s): "LIPASE", "AMYLASE" in the last 168 hours. No results for input(s): "AMMONIA" in the last 168 hours. CBC: Recent Labs  Lab 01/02/23 0449 01/03/23 0530 01/04/23 0518 01/05/23 0357 01/06/23 0442  WBC 10.1 8.1 8.9 8.8 10.3  HGB 11.6* 10.8* 11.3* 12.0* 13.1  HCT 36.8* 33.0* 35.1* 37.2* 40.5  MCV  95.3 91.7 91.6 91.4 92.0  PLT 254 243 231 234 254   Cardiac Enzymes: No results for input(s): "CKTOTAL", "CKMB", "CKMBINDEX", "TROPONINI" in the last 168 hours. BNP: Invalid input(s): "POCBNP" CBG: No results for input(s): "GLUCAP" in the last 168 hours. D-Dimer No results for input(s): "DDIMER" in the last 72 hours. Hgb A1c No results for input(s): "HGBA1C" in the last 72 hours. Lipid Profile No results for input(s): "CHOL", "HDL", "LDLCALC", "TRIG", "CHOLHDL", "LDLDIRECT" in the last 72 hours. Thyroid function studies No results for input(s): "TSH", "T4TOTAL", "T3FREE", "THYROIDAB" in the last 72 hours.  Invalid input(s): "FREET3" Anemia work up No results for input(s): "VITAMINB12", "FOLATE", "FERRITIN", "TIBC", "IRON", "RETICCTPCT" in the last 72 hours. Urinalysis No results found for: "COLORURINE", "APPEARANCEUR", "LABSPEC", "PHURINE", "GLUCOSEU", "HGBUR", "BILIRUBINUR", "KETONESUR", "PROTEINUR", "UROBILINOGEN", "NITRITE", "LEUKOCYTESUR" Sepsis  Labs Recent Labs  Lab 01/03/23 0530 01/04/23 0518 01/05/23 0357 01/06/23 0442  WBC 8.1 8.9 8.8 10.3   Microbiology Recent Results (from the past 240 hour(s))  SARS Coronavirus 2 by RT PCR (hospital order, performed in [facility] hospital lab) *cepheid single result test* Anterior Nasal Swab     Status: None   Collection Time: 01/01/23  5:49 PM   Specimen: Anterior Nasal Swab  Result Value Ref Range Status   SARS Coronavirus 2 by RT PCR NEGATIVE NEGATIVE Final    Comment: (NOTE) SARS-CoV-2 target nucleic acids are NOT DETECTED.  The SARS-CoV-2 RNA is generally detectable in upper and lower respiratory specimens during the acute phase of infection. The lowest concentration of SARS-CoV-2 viral copies this assay can detect is 250 copies / mL. A negative result does not preclude SARS-CoV-2 infection and should not be used as the sole basis for treatment or other patient management decisions.  A negative result may occur with improper specimen collection / handling, submission of specimen other than nasopharyngeal swab, presence of viral mutation(s) within the areas targeted by this assay, and inadequate number of viral copies (<250 copies / mL). A negative result must be combined with clinical observations, patient history, and epidemiological information.  Fact Sheet for Patients:   RoadLapTop.co.za  Fact Sheet for Healthcare Providers: http://kim-miller.com/  This test is not yet approved or  cleared by the [place] FDA and has been authorized for detection and/or diagnosis of SARS-CoV-2 by FDA under an Emergency Use Authorization (EUA).  This EUA will remain in effect (meaning this test can be used) for the duration of the COVID-19 declaration under Section 564(b)(1) of the Act, 21 U.S.C. section 360bbb-3(b)(1), unless the authorization is terminated or revoked sooner.  Performed at [facility], [address], [place], [place] [address]      Time coordinating discharge: Over 30 minutes  SIGNED:   Charise Killian, MD  [facility] 01/07/2023, 2:33 PM Pager   If 7PM-7AM, please contact night-coverage [email or web address]

## 2023-01-07 NOTE — Progress Notes (Signed)
Talked to patient family.  Encouraged them to monitor O2 levels at home as well as BP and daily weights.  Called MD if O2 drops below 90-92% and sustain from any length of time.  AVS was discussed including medications and follow appointments.

## 2023-01-07 NOTE — Progress Notes (Addendum)
SUBJECTIVE: Bob Brewer is a 82 y.o. male with medical history significant of chronic afib on warfarin, mild dementia, DM2 and HTN who presented to the ED on 01/01/23 for evaluation of dyspnea and swelling.   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   Vitals:   01/07/23 0323 01/07/23 0615 01/07/23 1034 01/07/23 1211  BP: 91/66  (!) 91/57 90/68  Pulse: (!) 57   65  Resp: 16   16  Temp: 98.1 F (36.7 C)   97.8 F (36.6 C)  TempSrc:      SpO2: 98%   96%  Weight:  72.5 kg    Height:        Intake/Output Summary (Last 24 hours) at 01/07/2023 1502 Last data filed at 01/07/2023 1038 Gross per 24 hour  Intake 480 ml  Output 800 ml  Net -320 ml    LABS: Basic Metabolic Panel: Recent Labs    01/05/23 0357 01/06/23 0442  NA 137 136  K 3.5 3.4*  CL 101 93*  CO2 26 29  GLUCOSE 176* 196*  BUN 39* 39*  CREATININE 1.68* 1.89*  CALCIUM 8.4* 8.9  MG 2.5* 1.6*   Liver Function Tests: No results for input(s): "AST", "ALT", "ALKPHOS", "BILITOT", "PROT", "ALBUMIN" in the last 72 hours. No results for input(s): "LIPASE", "AMYLASE" in the last 72 hours. CBC: Recent Labs    01/05/23 0357 01/06/23 0442  WBC 8.8 10.3  HGB 12.0* 13.1  HCT 37.2* 40.5  MCV 91.4 92.0  PLT 234 254   Cardiac Enzymes: No results for input(s): "CKTOTAL", "CKMB", "CKMBINDEX", "TROPONINI" in the last 72 hours. BNP: Invalid input(s): "POCBNP" D-Dimer: No results for input(s): "DDIMER" in the last 72 hours. Hemoglobin A1C: No results for input(s): "HGBA1C" in the last 72 hours. Fasting Lipid Panel: No results for input(s): "CHOL", "HDL", "LDLCALC", "TRIG", "CHOLHDL", "LDLDIRECT" in the last 72 hours. Thyroid Function Tests: No results for input(s): "TSH", "T4TOTAL", "T3FREE", "THYROIDAB" in the last 72 hours.  Invalid input(s): "FREET3" Anemia Panel: No results for input(s): "VITAMINB12", "FOLATE", "FERRITIN", "TIBC", "IRON", "RETICCTPCT" in the  last 72 hours.   PHYSICAL EXAM General: Well developed, well nourished, in no acute distress HEENT:  Normocephalic and atramatic Neck:  No JVD.  Lungs: Clear bilaterally to auscultation and percussion. Heart: HRRR . Normal S1 and S2 without gallops or murmurs.  Abdomen: Bowel sounds are positive, abdomen soft and non-tender  Msk:  Back normal, normal gait. Normal strength and tone for age. Extremities: No clubbing, cyanosis or edema.   Neuro: Alert and oriented X 3. Psych:  Good affect, responds appropriately  TELEMETRY: atrial fibrillation  ASSESSMENT AND PLAN: Patient sitting in bedside chair. Denies chest pain, shortness of breath. Patient can be discharged home today on torsemide and metoprolol with follow up with [facility] in 1-2 weeks.     ICD-10-CM   1. Shortness of breath  R06.02     2. Peripheral edema  R60.0     3. Type 2 diabetes mellitus with other specified complication, without long-term current use of insulin  E11.69 Diet Carb Modified      Principal Problem:   Dyspnea    Carely Nappier, FNP-C 01/07/2023 3:02 PM

## 2023-04-02 DIAGNOSIS — G309 Alzheimer's disease, unspecified: Secondary | ICD-10-CM | POA: Diagnosis not present

## 2023-04-02 DIAGNOSIS — M47812 Spondylosis without myelopathy or radiculopathy, cervical region: Secondary | ICD-10-CM | POA: Diagnosis not present

## 2023-04-02 DIAGNOSIS — I509 Heart failure, unspecified: Secondary | ICD-10-CM | POA: Diagnosis not present

## 2023-04-02 DIAGNOSIS — E119 Type 2 diabetes mellitus without complications: Secondary | ICD-10-CM | POA: Diagnosis not present

## 2023-04-02 DIAGNOSIS — G4700 Insomnia, unspecified: Secondary | ICD-10-CM | POA: Diagnosis not present

## 2023-04-02 DIAGNOSIS — R0602 Shortness of breath: Secondary | ICD-10-CM | POA: Diagnosis not present

## 2023-04-02 DIAGNOSIS — I4891 Unspecified atrial fibrillation: Secondary | ICD-10-CM | POA: Diagnosis not present

## 2023-04-09 ENCOUNTER — Emergency Department
Admission: EM | Admit: 2023-04-09 | Discharge: 2023-04-09 | Disposition: A | Discharge status: Home / Self Care | Payer: Medicare Other

## 2023-04-09 NOTE — ED Notes (Signed)
Bob Brewer was never a pt- registered by mistake - pt hx dementia.

## 2023-04-30 DIAGNOSIS — I10 Essential (primary) hypertension: Secondary | ICD-10-CM | POA: Diagnosis not present

## 2023-04-30 DIAGNOSIS — M47812 Spondylosis without myelopathy or radiculopathy, cervical region: Secondary | ICD-10-CM | POA: Diagnosis not present

## 2023-04-30 DIAGNOSIS — E119 Type 2 diabetes mellitus without complications: Secondary | ICD-10-CM | POA: Diagnosis not present

## 2023-04-30 DIAGNOSIS — I4891 Unspecified atrial fibrillation: Secondary | ICD-10-CM | POA: Diagnosis not present

## 2023-05-09 DIAGNOSIS — I509 Heart failure, unspecified: Secondary | ICD-10-CM | POA: Diagnosis not present

## 2023-05-09 DIAGNOSIS — K219 Gastro-esophageal reflux disease without esophagitis: Secondary | ICD-10-CM | POA: Diagnosis not present

## 2023-05-09 DIAGNOSIS — I4891 Unspecified atrial fibrillation: Secondary | ICD-10-CM | POA: Diagnosis not present

## 2023-05-09 DIAGNOSIS — G309 Alzheimer's disease, unspecified: Secondary | ICD-10-CM | POA: Diagnosis not present

## 2023-05-09 DIAGNOSIS — E119 Type 2 diabetes mellitus without complications: Secondary | ICD-10-CM | POA: Diagnosis not present

## 2023-05-09 DIAGNOSIS — I10 Essential (primary) hypertension: Secondary | ICD-10-CM | POA: Diagnosis not present

## 2023-05-16 ENCOUNTER — Other Ambulatory Visit: Payer: Self-pay | Admitting: Nurse Practitioner

## 2023-05-16 ENCOUNTER — Other Ambulatory Visit: Payer: Self-pay | Admitting: Neurology

## 2023-05-16 DIAGNOSIS — R413 Other amnesia: Secondary | ICD-10-CM

## 2023-05-16 NOTE — Telephone Encounter (Signed)
Last seen on 07/25/22 Follow up scheduled on 07/26/23

## 2023-05-28 ENCOUNTER — Telehealth: Payer: Self-pay | Admitting: Neurology

## 2023-05-28 DIAGNOSIS — R413 Other amnesia: Secondary | ICD-10-CM

## 2023-05-28 MED ORDER — DONEPEZIL HCL 10 MG PO TABS
10.0000 mg | ORAL_TABLET | Freq: Every day | ORAL | 0 refills | Status: DC
Start: 2023-05-28 — End: 2023-05-30

## 2023-05-28 NOTE — Telephone Encounter (Signed)
Called and spoke to carolyn and was able to verify pharmacy and send 90 day supply and she voiced gratitude and understanding

## 2023-05-28 NOTE — Telephone Encounter (Signed)
Pt's wife called and LVM stating the pt is completely out of his donepezil (ARICEPT) 10 MG tablet and is needing a refill sent in for him

## 2023-05-30 ENCOUNTER — Other Ambulatory Visit: Payer: Self-pay | Admitting: *Deleted

## 2023-05-30 DIAGNOSIS — R413 Other amnesia: Secondary | ICD-10-CM

## 2023-05-30 MED ORDER — DONEPEZIL HCL 10 MG PO TABS
10.0000 mg | ORAL_TABLET | Freq: Every day | ORAL | 0 refills | Status: DC
Start: 2023-05-30 — End: 2024-01-29

## 2023-06-10 ENCOUNTER — Other Ambulatory Visit: Payer: Self-pay | Admitting: Nurse Practitioner

## 2023-07-19 ENCOUNTER — Other Ambulatory Visit: Payer: Self-pay

## 2023-07-19 ENCOUNTER — Emergency Department: Payer: Medicare Other

## 2023-07-19 DIAGNOSIS — R000 Tachycardia, unspecified: Secondary | ICD-10-CM | POA: Diagnosis not present

## 2023-07-19 DIAGNOSIS — I4891 Unspecified atrial fibrillation: Secondary | ICD-10-CM | POA: Diagnosis not present

## 2023-07-19 DIAGNOSIS — N1832 Chronic kidney disease, stage 3b: Secondary | ICD-10-CM | POA: Diagnosis not present

## 2023-07-19 DIAGNOSIS — Z7901 Long term (current) use of anticoagulants: Secondary | ICD-10-CM

## 2023-07-19 DIAGNOSIS — J9601 Acute respiratory failure with hypoxia: Secondary | ICD-10-CM | POA: Diagnosis not present

## 2023-07-19 DIAGNOSIS — I5043 Acute on chronic combined systolic (congestive) and diastolic (congestive) heart failure: Secondary | ICD-10-CM | POA: Diagnosis not present

## 2023-07-19 DIAGNOSIS — I482 Chronic atrial fibrillation, unspecified: Secondary | ICD-10-CM | POA: Diagnosis present

## 2023-07-19 DIAGNOSIS — Z87891 Personal history of nicotine dependence: Secondary | ICD-10-CM | POA: Diagnosis not present

## 2023-07-19 DIAGNOSIS — J9811 Atelectasis: Secondary | ICD-10-CM | POA: Diagnosis not present

## 2023-07-19 DIAGNOSIS — N400 Enlarged prostate without lower urinary tract symptoms: Secondary | ICD-10-CM | POA: Diagnosis present

## 2023-07-19 DIAGNOSIS — R791 Abnormal coagulation profile: Secondary | ICD-10-CM | POA: Diagnosis present

## 2023-07-19 DIAGNOSIS — E785 Hyperlipidemia, unspecified: Secondary | ICD-10-CM | POA: Diagnosis not present

## 2023-07-19 DIAGNOSIS — I429 Cardiomyopathy, unspecified: Secondary | ICD-10-CM | POA: Diagnosis present

## 2023-07-19 DIAGNOSIS — R918 Other nonspecific abnormal finding of lung field: Secondary | ICD-10-CM | POA: Diagnosis not present

## 2023-07-19 DIAGNOSIS — I447 Left bundle-branch block, unspecified: Secondary | ICD-10-CM | POA: Diagnosis not present

## 2023-07-19 DIAGNOSIS — E1122 Type 2 diabetes mellitus with diabetic chronic kidney disease: Secondary | ICD-10-CM | POA: Diagnosis not present

## 2023-07-19 DIAGNOSIS — R0602 Shortness of breath: Secondary | ICD-10-CM | POA: Diagnosis not present

## 2023-07-19 DIAGNOSIS — Z1152 Encounter for screening for COVID-19: Secondary | ICD-10-CM

## 2023-07-19 DIAGNOSIS — Z79899 Other long term (current) drug therapy: Secondary | ICD-10-CM | POA: Diagnosis not present

## 2023-07-19 DIAGNOSIS — I130 Hypertensive heart and chronic kidney disease with heart failure and stage 1 through stage 4 chronic kidney disease, or unspecified chronic kidney disease: Secondary | ICD-10-CM | POA: Diagnosis not present

## 2023-07-19 DIAGNOSIS — Z7984 Long term (current) use of oral hypoglycemic drugs: Secondary | ICD-10-CM

## 2023-07-19 DIAGNOSIS — J90 Pleural effusion, not elsewhere classified: Secondary | ICD-10-CM | POA: Diagnosis not present

## 2023-07-19 DIAGNOSIS — D631 Anemia in chronic kidney disease: Secondary | ICD-10-CM | POA: Diagnosis present

## 2023-07-19 DIAGNOSIS — Z66 Do not resuscitate: Secondary | ICD-10-CM | POA: Diagnosis not present

## 2023-07-19 DIAGNOSIS — Z515 Encounter for palliative care: Secondary | ICD-10-CM

## 2023-07-19 DIAGNOSIS — K219 Gastro-esophageal reflux disease without esophagitis: Secondary | ICD-10-CM | POA: Diagnosis present

## 2023-07-19 DIAGNOSIS — I10 Essential (primary) hypertension: Secondary | ICD-10-CM | POA: Diagnosis not present

## 2023-07-19 DIAGNOSIS — R609 Edema, unspecified: Secondary | ICD-10-CM | POA: Diagnosis not present

## 2023-07-19 DIAGNOSIS — N179 Acute kidney failure, unspecified: Secondary | ICD-10-CM | POA: Diagnosis present

## 2023-07-19 DIAGNOSIS — I110 Hypertensive heart disease with heart failure: Secondary | ICD-10-CM | POA: Diagnosis not present

## 2023-07-19 DIAGNOSIS — R32 Unspecified urinary incontinence: Secondary | ICD-10-CM | POA: Diagnosis not present

## 2023-07-19 DIAGNOSIS — F0390 Unspecified dementia without behavioral disturbance: Secondary | ICD-10-CM | POA: Diagnosis present

## 2023-07-19 DIAGNOSIS — I509 Heart failure, unspecified: Secondary | ICD-10-CM | POA: Diagnosis not present

## 2023-07-19 DIAGNOSIS — R0689 Other abnormalities of breathing: Secondary | ICD-10-CM | POA: Diagnosis not present

## 2023-07-19 DIAGNOSIS — J189 Pneumonia, unspecified organism: Secondary | ICD-10-CM | POA: Diagnosis not present

## 2023-07-19 LAB — TROPONIN I (HIGH SENSITIVITY): Troponin I (High Sensitivity): 21 ng/L — ABNORMAL HIGH (ref <18)

## 2023-07-19 LAB — CBC
HCT: 32.4 % — ABNORMAL LOW (ref 39.0–52.0)
Hemoglobin: 10.3 g/dL — ABNORMAL LOW (ref 13.0–17.0)
MCH: 30.7 pg (ref 26.0–34.0)
MCHC: 31.8 g/dL (ref 30.0–36.0)
MCV: 96.4 fL (ref 80.0–100.0)
Platelets: 272 10*3/uL (ref 150–400)
RBC: 3.36 MIL/uL — ABNORMAL LOW (ref 4.22–5.81)
RDW: 13.7 % (ref 11.5–15.5)
WBC: 8.4 10*3/uL (ref 4.0–10.5)
nRBC: 0 % (ref 0.0–0.2)

## 2023-07-19 LAB — BASIC METABOLIC PANEL
Anion gap: 7 (ref 5–15)
BUN: 42 mg/dL — ABNORMAL HIGH (ref 8–23)
CO2: 25 mmol/L (ref 22–32)
Calcium: 8.4 mg/dL — ABNORMAL LOW (ref 8.9–10.3)
Chloride: 104 mmol/L (ref 98–111)
Creatinine, Ser: 2.42 mg/dL — ABNORMAL HIGH (ref 0.61–1.24)
GFR, Estimated: 26 mL/min — ABNORMAL LOW (ref >60)
Glucose, Bld: 207 mg/dL — ABNORMAL HIGH (ref 70–99)
Potassium: 4.6 mmol/L (ref 3.5–5.1)
Sodium: 136 mmol/L (ref 135–145)

## 2023-07-19 NOTE — ED Triage Notes (Signed)
EMS brings pt in from home for c/o [facility] for several months, swelling to LE(s)

## 2023-07-19 NOTE — ED Triage Notes (Addendum)
Pt to ED via [facility] from home c/o [facility] for souple of months. Pt reports it has gotten better. Pt on 3L O2, reports he doesn't usually wear oxygen. Denies CP, fevers, dizziness

## 2023-07-20 ENCOUNTER — Inpatient Hospital Stay
Admission: EM | Admit: 2023-07-20 | Discharge: 2023-07-23 | DRG: 291 | Disposition: A | Discharge status: Home Health | Payer: Medicare Other | Attending: Internal Medicine | Admitting: Internal Medicine

## 2023-07-20 ENCOUNTER — Other Ambulatory Visit: Payer: Self-pay | Admitting: Neurology

## 2023-07-20 DIAGNOSIS — A419 Sepsis, unspecified organism: Secondary | ICD-10-CM | POA: Diagnosis not present

## 2023-07-20 DIAGNOSIS — I5022 Chronic systolic (congestive) heart failure: Secondary | ICD-10-CM | POA: Diagnosis not present

## 2023-07-20 DIAGNOSIS — J9600 Acute respiratory failure, unspecified whether with hypoxia or hypercapnia: Secondary | ICD-10-CM | POA: Diagnosis not present

## 2023-07-20 DIAGNOSIS — E1122 Type 2 diabetes mellitus with diabetic chronic kidney disease: Secondary | ICD-10-CM | POA: Diagnosis present

## 2023-07-20 DIAGNOSIS — N183 Chronic kidney disease, stage 3 unspecified: Secondary | ICD-10-CM | POA: Diagnosis not present

## 2023-07-20 DIAGNOSIS — K219 Gastro-esophageal reflux disease without esophagitis: Secondary | ICD-10-CM

## 2023-07-20 DIAGNOSIS — J9601 Acute respiratory failure with hypoxia: Secondary | ICD-10-CM | POA: Diagnosis not present

## 2023-07-20 DIAGNOSIS — E785 Hyperlipidemia, unspecified: Secondary | ICD-10-CM

## 2023-07-20 DIAGNOSIS — D631 Anemia in chronic kidney disease: Secondary | ICD-10-CM | POA: Diagnosis present

## 2023-07-20 DIAGNOSIS — I4891 Unspecified atrial fibrillation: Secondary | ICD-10-CM

## 2023-07-20 DIAGNOSIS — R413 Other amnesia: Secondary | ICD-10-CM | POA: Diagnosis not present

## 2023-07-20 DIAGNOSIS — Z1152 Encounter for screening for COVID-19: Secondary | ICD-10-CM | POA: Diagnosis not present

## 2023-07-20 DIAGNOSIS — I5043 Acute on chronic combined systolic (congestive) and diastolic (congestive) heart failure: Secondary | ICD-10-CM | POA: Diagnosis present

## 2023-07-20 DIAGNOSIS — R791 Abnormal coagulation profile: Secondary | ICD-10-CM | POA: Diagnosis present

## 2023-07-20 DIAGNOSIS — N400 Enlarged prostate without lower urinary tract symptoms: Secondary | ICD-10-CM

## 2023-07-20 DIAGNOSIS — R32 Unspecified urinary incontinence: Secondary | ICD-10-CM | POA: Diagnosis not present

## 2023-07-20 DIAGNOSIS — I130 Hypertensive heart and chronic kidney disease with heart failure and stage 1 through stage 4 chronic kidney disease, or unspecified chronic kidney disease: Secondary | ICD-10-CM | POA: Diagnosis present

## 2023-07-20 DIAGNOSIS — Z7901 Long term (current) use of anticoagulants: Secondary | ICD-10-CM | POA: Diagnosis not present

## 2023-07-20 DIAGNOSIS — E1169 Type 2 diabetes mellitus with other specified complication: Secondary | ICD-10-CM | POA: Diagnosis not present

## 2023-07-20 DIAGNOSIS — Z7984 Long term (current) use of oral hypoglycemic drugs: Secondary | ICD-10-CM | POA: Diagnosis not present

## 2023-07-20 DIAGNOSIS — F0390 Unspecified dementia without behavioral disturbance: Secondary | ICD-10-CM | POA: Diagnosis present

## 2023-07-20 DIAGNOSIS — E119 Type 2 diabetes mellitus without complications: Secondary | ICD-10-CM

## 2023-07-20 DIAGNOSIS — J9811 Atelectasis: Secondary | ICD-10-CM | POA: Diagnosis present

## 2023-07-20 DIAGNOSIS — I10 Essential (primary) hypertension: Secondary | ICD-10-CM | POA: Diagnosis present

## 2023-07-20 DIAGNOSIS — Z79899 Other long term (current) drug therapy: Secondary | ICD-10-CM | POA: Diagnosis not present

## 2023-07-20 DIAGNOSIS — Z66 Do not resuscitate: Secondary | ICD-10-CM | POA: Diagnosis not present

## 2023-07-20 DIAGNOSIS — I429 Cardiomyopathy, unspecified: Secondary | ICD-10-CM | POA: Diagnosis present

## 2023-07-20 DIAGNOSIS — J189 Pneumonia, unspecified organism: Secondary | ICD-10-CM | POA: Diagnosis present

## 2023-07-20 DIAGNOSIS — I509 Heart failure, unspecified: Secondary | ICD-10-CM

## 2023-07-20 DIAGNOSIS — N1832 Chronic kidney disease, stage 3b: Secondary | ICD-10-CM | POA: Diagnosis present

## 2023-07-20 DIAGNOSIS — I447 Left bundle-branch block, unspecified: Secondary | ICD-10-CM | POA: Diagnosis present

## 2023-07-20 DIAGNOSIS — Z515 Encounter for palliative care: Secondary | ICD-10-CM | POA: Diagnosis not present

## 2023-07-20 DIAGNOSIS — Z87891 Personal history of nicotine dependence: Secondary | ICD-10-CM | POA: Diagnosis not present

## 2023-07-20 DIAGNOSIS — N179 Acute kidney failure, unspecified: Secondary | ICD-10-CM | POA: Diagnosis not present

## 2023-07-20 DIAGNOSIS — I482 Chronic atrial fibrillation, unspecified: Secondary | ICD-10-CM | POA: Diagnosis not present

## 2023-07-20 LAB — TROPONIN I (HIGH SENSITIVITY): Troponin I (High Sensitivity): 24 ng/L — ABNORMAL HIGH (ref <18)

## 2023-07-20 LAB — CBG MONITORING, ED
Glucose-Capillary: 162 mg/dL — ABNORMAL HIGH (ref 70–99)
Glucose-Capillary: 161 mg/dL — ABNORMAL HIGH (ref 70–99)
Glucose-Capillary: 153 mg/dL — ABNORMAL HIGH (ref 70–99)

## 2023-07-20 LAB — PROCALCITONIN: Procalcitonin: <0.1 ng/mL

## 2023-07-20 LAB — RESP PANEL BY RT-PCR (RSV, FLU A&B, COVID)  RVPGX2
Influenza A by PCR: NEGATIVE
Influenza B by PCR: NEGATIVE
Resp Syncytial Virus by PCR: NEGATIVE
SARS Coronavirus 2 by RT PCR: NEGATIVE

## 2023-07-20 LAB — HEPATIC FUNCTION PANEL
ALT: 12 U/L (ref 0–44)
AST: 13 U/L — ABNORMAL LOW (ref 15–41)
Albumin: 2.9 g/dL — ABNORMAL LOW (ref 3.5–5.0)
Alkaline Phosphatase: 97 U/L (ref 38–126)
Bilirubin, Direct: 0.2 mg/dL (ref 0.0–0.2)
Indirect Bilirubin: 0.3 mg/dL (ref 0.3–0.9)
Total Bilirubin: 0.5 mg/dL (ref 0.3–1.2)
Total Protein: 6.9 g/dL (ref 6.5–8.1)

## 2023-07-20 LAB — BRAIN NATRIURETIC PEPTIDE: B Natriuretic Peptide: 1713.1 pg/mL — ABNORMAL HIGH (ref 0.0–100.0)

## 2023-07-20 LAB — LACTIC ACID, PLASMA
Lactic Acid, Venous: 1 mmol/L (ref 0.5–1.9)
Lactic Acid, Venous: 1.2 mmol/L (ref 0.5–1.9)

## 2023-07-20 LAB — PROTIME-INR
INR: 5.5 (ref 0.8–1.2)
Prothrombin Time: 50.3 s — ABNORMAL HIGH (ref 11.4–15.2)

## 2023-07-20 LAB — GLUCOSE, CAPILLARY: Glucose-Capillary: 158 mg/dL — ABNORMAL HIGH (ref 70–99)

## 2023-07-20 MED ORDER — FUROSEMIDE 10 MG/ML IJ SOLN
80.0000 mg | Freq: Two times a day (BID) | INTRAMUSCULAR | Status: DC
  Administered 2023-07-20 – 2023-07-21 (×3): 80 mg via INTRAVENOUS
  Filled 2023-07-20 (×3): qty 8

## 2023-07-20 MED ORDER — MEMANTINE HCL 10 MG PO TABS
10.0000 mg | ORAL_TABLET | Freq: Two times a day (BID) | ORAL | Status: DC
  Administered 2023-07-20 – 2023-07-23 (×7): 10 mg via ORAL
  Filled 2023-07-20 (×2): qty 1
  Filled 2023-07-20: qty 2
  Filled 2023-07-20 (×4): qty 1

## 2023-07-20 MED ORDER — INSULIN ASPART 100 UNIT/ML IJ SOLN
0.0000 [IU] | Freq: Three times a day (TID) | INTRAMUSCULAR | Status: DC
  Administered 2023-07-20: 2 [IU] via SUBCUTANEOUS
  Administered 2023-07-21: 5 [IU] via SUBCUTANEOUS
  Administered 2023-07-21: 1 [IU] via SUBCUTANEOUS
  Administered 2023-07-21: 2 [IU] via SUBCUTANEOUS
  Administered 2023-07-22 (×2): 1 [IU] via SUBCUTANEOUS
  Administered 2023-07-22: 5 [IU] via SUBCUTANEOUS
  Administered 2023-07-23: 1 [IU] via SUBCUTANEOUS
  Filled 2023-07-20 (×8): qty 1

## 2023-07-20 MED ORDER — GUAIFENESIN ER 600 MG PO TB12
600.0000 mg | ORAL_TABLET | Freq: Two times a day (BID) | ORAL | Status: DC
  Administered 2023-07-20 – 2023-07-23 (×7): 600 mg via ORAL
  Filled 2023-07-20 (×7): qty 1

## 2023-07-20 MED ORDER — LISINOPRIL 20 MG PO TABS
40.0000 mg | ORAL_TABLET | Freq: Every day | ORAL | Status: DC
  Administered 2023-07-20 – 2023-07-23 (×4): 40 mg via ORAL
  Filled 2023-07-20 (×2): qty 2
  Filled 2023-07-20: qty 4
  Filled 2023-07-20: qty 2

## 2023-07-20 MED ORDER — ACETAMINOPHEN 325 MG PO TABS: 650.0000 mg | ORAL_TABLET | Freq: Four times a day (QID) | ORAL | Status: DC | PRN

## 2023-07-20 MED ORDER — HYDROCOD POLI-CHLORPHE POLI ER 10-8 MG/5ML PO SUER: 5.0000 mL | Freq: Two times a day (BID) | ORAL | Status: DC | PRN

## 2023-07-20 MED ORDER — TAMSULOSIN HCL 0.4 MG PO CAPS
0.4000 mg | ORAL_CAPSULE | Freq: Every day | ORAL | Status: DC
  Administered 2023-07-20 – 2023-07-23 (×4): 0.4 mg via ORAL
  Filled 2023-07-20 (×4): qty 1

## 2023-07-20 MED ORDER — ONDANSETRON HCL 4 MG PO TABS: 4.0000 mg | ORAL_TABLET | Freq: Four times a day (QID) | ORAL | Status: DC | PRN

## 2023-07-20 MED ORDER — WARFARIN SODIUM 2.5 MG PO TABS: 2.5000 mg | ORAL_TABLET | ORAL | Status: DC

## 2023-07-20 MED ORDER — DIGOXIN 0.25 MG/ML IJ SOLN
0.2500 mg | Freq: Once | INTRAMUSCULAR | Status: AC
  Administered 2023-07-20: 0.25 mg via INTRAVENOUS
  Filled 2023-07-20: qty 2

## 2023-07-20 MED ORDER — DONEPEZIL HCL 5 MG PO TABS
10.0000 mg | ORAL_TABLET | Freq: Every day | ORAL | Status: DC
  Administered 2023-07-20 – 2023-07-23 (×4): 10 mg via ORAL
  Filled 2023-07-20 (×4): qty 2

## 2023-07-20 MED ORDER — PRAVASTATIN SODIUM 40 MG PO TABS
40.0000 mg | ORAL_TABLET | Freq: Every day | ORAL | Status: DC
  Administered 2023-07-20 – 2023-07-23 (×4): 40 mg via ORAL
  Filled 2023-07-20 (×2): qty 1
  Filled 2023-07-20: qty 2
  Filled 2023-07-20: qty 1

## 2023-07-20 MED ORDER — SODIUM CHLORIDE 0.9 % IV SOLN
2.0000 g | INTRAVENOUS | Status: DC
  Administered 2023-07-20: 2 g via INTRAVENOUS
  Filled 2023-07-20: qty 20

## 2023-07-20 MED ORDER — WARFARIN SODIUM 5 MG PO TABS: 5.0000 mg | ORAL_TABLET | Freq: Every day | ORAL | Status: DC

## 2023-07-20 MED ORDER — IPRATROPIUM-ALBUTEROL 0.5-2.5 (3) MG/3ML IN SOLN: 3.0000 mL | RESPIRATORY_TRACT | Status: DC | PRN

## 2023-07-20 MED ORDER — VITAMIN D 25 MCG (1000 UNIT) PO TABS
2000.0000 [IU] | ORAL_TABLET | Freq: Every day | ORAL | Status: DC
  Administered 2023-07-20 – 2023-07-23 (×4): 2000 [IU] via ORAL
  Filled 2023-07-20 (×4): qty 2

## 2023-07-20 MED ORDER — SODIUM CHLORIDE 0.9% FLUSH
10.0000 mL | Freq: Two times a day (BID) | INTRAVENOUS | Status: DC
  Administered 2023-07-20 – 2023-07-23 (×7): 10 mL via INTRAVENOUS

## 2023-07-20 MED ORDER — FINASTERIDE 5 MG PO TABS
5.0000 mg | ORAL_TABLET | Freq: Every day | ORAL | Status: DC
  Administered 2023-07-20 – 2023-07-23 (×4): 5 mg via ORAL
  Filled 2023-07-20 (×4): qty 1

## 2023-07-20 MED ORDER — FUROSEMIDE 10 MG/ML IJ SOLN: 40.0000 mg | Freq: Two times a day (BID) | INTRAMUSCULAR | Status: DC

## 2023-07-20 MED ORDER — CYANOCOBALAMIN 1000 MCG/ML IJ SOLN
1000.0000 ug | INTRAMUSCULAR | Status: DC
Start: 2023-08-16 — End: 2023-07-23

## 2023-07-20 MED ORDER — DEXTROSE 5 % IV SOLN: 500.0000 mg | INTRAVENOUS | Status: DC

## 2023-07-20 MED ORDER — FUROSEMIDE 10 MG/ML IJ SOLN
40.0000 mg | Freq: Once | INTRAMUSCULAR | Status: AC
  Administered 2023-07-20: 40 mg via INTRAVENOUS
  Filled 2023-07-20: qty 4

## 2023-07-20 MED ORDER — DEXTROSE 5 % IV SOLN
500.0000 mg | INTRAVENOUS | Status: DC
  Administered 2023-07-20: 500 mg via INTRAVENOUS
  Filled 2023-07-20: qty 5

## 2023-07-20 MED ORDER — MAGNESIUM HYDROXIDE 400 MG/5ML PO SUSP: 30.0000 mL | Freq: Every day | ORAL | Status: DC | PRN

## 2023-07-20 MED ORDER — SODIUM CHLORIDE 0.9 % IV SOLN: 2.0000 g | INTRAVENOUS | Status: DC

## 2023-07-20 MED ORDER — ACETAMINOPHEN 650 MG RE SUPP: 650.0000 mg | Freq: Four times a day (QID) | RECTAL | Status: DC | PRN

## 2023-07-20 MED ORDER — INSULIN ASPART 100 UNIT/ML IJ SOLN
0.0000 [IU] | Freq: Every day | INTRAMUSCULAR | Status: DC
Start: 2023-07-20 — End: 2023-07-23

## 2023-07-20 MED ORDER — PANTOPRAZOLE SODIUM 40 MG PO TBEC
40.0000 mg | DELAYED_RELEASE_TABLET | Freq: Every day | ORAL | Status: DC
  Administered 2023-07-20 – 2023-07-23 (×4): 40 mg via ORAL
  Filled 2023-07-20 (×4): qty 1

## 2023-07-20 MED ORDER — SPIRONOLACTONE 25 MG PO TABS
25.0000 mg | ORAL_TABLET | Freq: Every day | ORAL | Status: DC
  Administered 2023-07-20 – 2023-07-23 (×4): 25 mg via ORAL
  Filled 2023-07-20 (×4): qty 1

## 2023-07-20 MED ORDER — TRAZODONE HCL 50 MG PO TABS: 25.0000 mg | ORAL_TABLET | Freq: Every evening | ORAL | Status: DC | PRN

## 2023-07-20 MED ORDER — ONDANSETRON HCL 4 MG/2ML IJ SOLN: 4.0000 mg | Freq: Four times a day (QID) | INTRAMUSCULAR | Status: DC | PRN

## 2023-07-20 MED ORDER — INSULIN ASPART 100 UNIT/ML IJ SOLN
0.0000 [IU] | INTRAMUSCULAR | Status: DC
  Administered 2023-07-20 (×2): 2 [IU] via SUBCUTANEOUS
  Filled 2023-07-20 (×2): qty 1

## 2023-07-20 MED ORDER — IPRATROPIUM-ALBUTEROL 0.5-2.5 (3) MG/3ML IN SOLN
3.0000 mL | Freq: Four times a day (QID) | RESPIRATORY_TRACT | Status: DC
  Administered 2023-07-20 – 2023-07-21 (×5): 3 mL via RESPIRATORY_TRACT
  Filled 2023-07-20 (×5): qty 3

## 2023-07-20 MED ORDER — METOPROLOL SUCCINATE ER 25 MG PO TB24
25.0000 mg | ORAL_TABLET | Freq: Every day | ORAL | Status: DC
  Administered 2023-07-20 – 2023-07-23 (×4): 25 mg via ORAL
  Filled 2023-07-20 (×4): qty 1

## 2023-07-20 NOTE — Consult Note (Signed)
PHARMACY - ANTICOAGULATION CONSULT NOTE  Pharmacy Consult for Warfarin Indication: atrial fibrillation  No Known Allergies  Patient Measurements: Height: 5\' 4"  (162.6 cm) Weight: 70 kg (154 lb 5.2 oz) IBW/kg (Calculated) : 59.2 Heparin Dosing Weight: 70 kg  Vital Signs: Temp: 97.6 F (36.4 C) (11/01 0846) Temp Source: Oral (11/01 0846) BP: 128/80 (11/01 1300) Pulse Rate: 121 (11/01 1300)  Labs: Recent Labs    07/19/23 0110 07/19/23 2118 07/20/23 0300  HGB  --  10.3*  --   HCT  --  32.4*  --   PLT  --  272  --   LABPROT  --   --  50.3*  INR  --   --  5.5*  CREATININE  --  2.42*  --   TROPONINIHS 24* 21*  --     Estimated Creatinine Clearance: 20 mL/min (A) (by C-G formula based on SCr of 2.42 mg/dL (H)).   Medical History: Past Medical History:  Diagnosis Date   Diabetes mellitus without complication (HCC)    Hypertension    Memory loss     Medications:  Warfarin 2.5mg  daily, unsure of last dose taken.  Assessment: 82 y.o. male with medical history significant for type 2 diabetes mellitus, combined systolic and diastolic CHF, atrial fibrillation(on warfarin) and hypertension, presented to the ER with acute onset of worsening dyspnea which has been progressive lately with associated worsening lower extremity edema. Cardiology and palliative care was consulted. EKG shows Afib with RVR. Pharmacy has been consulted to restart warfarin.  Goal of Therapy:  INR 2-3 Monitor platelets by anticoagulation protocol: Yes  Date INR Dose  11/1 5.5 held              Plan:  - INR supratherapeutic - Will hold warfarin and recheck level tomorrow - INR/CBC daily  Steele Ledonne A Maleeka Sabatino 07/20/2023,1:39 PM

## 2023-07-20 NOTE — Hospital Course (Addendum)
Taken from H&P.  Bob Brewer is a 82 y.o. male with medical history significant for type 2 diabetes mellitus, combined systolic and diastolic CHF, and hypertension, presented to the ER with acute onset of worsening dyspnea which has been progressive lately with associated worsening lower extremity edema.  He denied any fever or chills.  No chest pain or palpitations.  He was hypoxic and came on 3 L of O2 by nasal cannula placed by EMS.  He is not on home oxygen.   On arrival to ED he was tachycardic at 110, tachypneic and saturating in mid 90s on 3 L of oxygen.  Labs pertinent for BUN of 42, creatinine 2.42, BNP 1713, hemoglobin 10.3, INR 5.5 on Coumadin.  Most recent echo on 01/02/2023 revealed EF of 25 to 30% and grade 2 diastolic dysfunction.   Chest x-ray with mild to moderate bibasilar atelectasis and/or infiltrate and small bilateral pleural effusion. EKG shows A-fib with RVR,  He was started on ceftriaxone and Zithromax along with IV diuresis.  11/1: Vital stable.  Likely acute on chronic HFrEF, remained very short of breath with colder extremities, likely low output heart failure.  Procalcitonin negative so sepsis ruled out and antibiotics are being discontinued.  Cardiology and palliative care was consulted, patient currently wants to remain full code. Repeat echo was ordered.  11/2: Heart rate remained mildly elevated, improving renal function with creatinine at 2.35 today.  INR with some improvement but remained supratherapeutic at 4.4.  Oxygen requirement improved to 2 L.  Pending repeat echo.  CODE STATUS changed to DNR with full scope of medical care.  Continuing IV diuresis  11/3: Vitals mostly stable, improving INR to 3.1 today, slight worsening of BUN/creatinine, decreasing the dose of IV Lasix to 40 mg twice daily instead of 80.  PT is recommending home health.  Also still desaturate with ambulation-DME oxygen ordered. Likely be discharged tomorrow

## 2023-07-20 NOTE — Progress Notes (Signed)
Progress Note   Patient: Bob Brewer DGU:[id number] DOB: 1941/08/10 DOA: 07/20/2023     0 DOS: the patient was seen and examined on 07/20/2023   Brief hospital course: Taken from H&P.  Bob Brewer is a 82 y.o. male with medical history significant for type 2 diabetes mellitus, combined systolic and diastolic CHF, and hypertension, presented to the ER with acute onset of worsening dyspnea which has been progressive lately with associated worsening lower extremity edema.  He denied any fever or chills.  No chest pain or palpitations.  He was hypoxic and came on 3 L of O2 by nasal cannula placed by EMS.  He is not on home oxygen.   On arrival to ED he was tachycardic at 110, tachypneic and saturating in mid 90s on 3 L of oxygen.  Labs pertinent for BUN of 42, creatinine 2.42, BNP 1713, hemoglobin 10.3, INR 5.5 on Coumadin.  Most recent echo on 01/02/2023 revealed EF of 25 to 30% and grade 2 diastolic dysfunction.   Chest x-ray with mild to moderate bibasilar atelectasis and/or infiltrate and small bilateral pleural effusion. EKG shows A-fib with RVR,  He was started on ceftriaxone and Zithromax along with IV diuresis.  11/1: Vital stable.  Likely acute on chronic HFrEF, remained very short of breath with colder extremities, likely low output heart failure.  Procalcitonin negative so sepsis ruled out and antibiotics are being discontinued.  Cardiology and palliative care was consulted, patient currently wants to remain full code. Repeat echo was ordered   Assessment and Plan: * Acute on chronic combined systolic and diastolic CHF (congestive heart failure) (HCC) Prior echocardiogram with EF of 25 to 30%, elevated BNP and clinically appears hypervolemic.  Started on IV diuresis. Symptoms are more concerning for acute on chronic HFrEF, cardiology was consulted.  Concern of low output heart failure with cold extremities. - Will continue diuresis with IV Lasix. - We will continue Aldactone and  Toprol-XL. - Will monitor I's and O's. -Daily weight and BMP  Sepsis due to pneumonia [facility]) Patient initially met SIRS criteria and there was a concern of pneumonia but likely bilateral atelectasis.  Procalcitonin negative and he does not have upper respiratory symptoms. Sepsis ruled out at this time and stopping IV antibiotics. Likely acute on chronic heart failure. -Continue supportive care and close monitoring  Atrial fibrillation with RVR (HCC) Heart rate remained mildly elevated. Supratherapeutic INR with Coumadin -Continue with home metoprolol -Coumadin per pharmacy -Cardiology was also consulted  Acute respiratory failure with hypoxia (HCC) Likely secondary to heart failure.  No baseline oxygen use. -Continue with supplemental oxygen-wean as tolerated  Essential hypertension - We will continue antihypertensive therapy.  Dyslipidemia - Continue statin therapy.  Diabetes mellitus (HCC) Patient was on metformin at home. -SSI  Memory loss - This could be associated with early dementia. - We will continue Namenda and Aricept.Marland Kitchen  GERD without esophagitis - We will continue PPI therapy.  BPH (benign prostatic hyperplasia) - We will continue Proscar.   Subjective: Patient continued to have significant shortness of breath and orthopnea.  No chest pain  Physical Exam: Vitals:   07/20/23 0846 07/20/23 1000 07/20/23 1130 07/20/23 1200  BP:  113/76 119/80 (!) 124/94  Pulse:  68 (!) 108   Resp:  (!) 29 (!) 27 (!) 26  Temp: 97.6 F (36.4 C)     TempSrc: Oral     SpO2:  92% 100%   Weight:      Height:  General.  Frail elderly man, in no acute distress. Pulmonary.  Lungs clear bilaterally, decreased breath sound at bases. CV.  Irregularly irregular Abdomen.  Soft, nontender, nondistended, BS positive. CNS.  Alert and oriented .  No focal neurologic deficit. Extremities.  2+ LE edema,  pulses intact and symmetrical.  Extremities cold as compared to proximal  limb Psychiatry.  Judgment and insight appears normal.   Data Reviewed: Prior data reviewed  Family Communication: Discussed with wife at bedside  Disposition: Status is: Inpatient Remains inpatient appropriate because: Severity of illness  Planned Discharge Destination:  To be determined  Time spent:  minutes  This record has been created using Conservation officer, historic buildings. Errors have been sought and corrected,but may not always be located. Such creation errors do not reflect on the standard of care.   Author: Arnetha Courser, MD 07/20/2023 1:05 PM  For on call review [email or web address].

## 2023-07-20 NOTE — Assessment & Plan Note (Signed)
-   We will continue PPI therapy 

## 2023-07-20 NOTE — Assessment & Plan Note (Signed)
Continue statin therapy.

## 2023-07-20 NOTE — Assessment & Plan Note (Signed)
Patient was on metformin at home. -SSI 

## 2023-07-20 NOTE — Assessment & Plan Note (Addendum)
Heart rate remained mildly elevated. Supratherapeutic INR with Coumadin -Continue with home metoprolol -Coumadin per pharmacy -Cardiology was also consulted

## 2023-07-20 NOTE — Sepsis Progress Note (Signed)
Following for sepsis monitoring ?

## 2023-07-20 NOTE — Assessment & Plan Note (Addendum)
Likely secondary to heart failure.  No baseline oxygen use. -Continue with supplemental oxygen-wean as tolerated

## 2023-07-20 NOTE — Assessment & Plan Note (Signed)
-   We will continue Proscar. 

## 2023-07-20 NOTE — Assessment & Plan Note (Addendum)
-   This could be associated with early dementia. - We will continue Namenda and Aricept.Marland Kitchen

## 2023-07-20 NOTE — Consult Note (Signed)
[facility]  CARDIOLOGY CONSULT NOTE  Patient ID: Bob Brewer MRN: [id number] DOB/AGE: July 15, 1941 82 y.o.  Admit date: 07/20/2023 Referring Physician Dr. Tilman Neat Primary Cardiologist [facility] Reason for Consultation Acute on chronic HFrEF  HPI: 82 year old male with past medical history significant for heart failure with reduced EF, LVEF 25 to 30%, diabetes, hypertension, long standing atrial fibrillation presented to hospital with worsening exertional dyspnea and increased pedal edema.  Associated with orthopnea.  No chest pain/pressure.  No palpitations.  ENP elevated 1700.  Troponin minimal and flat.  EKG with A-fib with RVR.  Review of systems complete and found to be negative unless listed above     Past Medical History:  Diagnosis Date   Diabetes mellitus without complication (HCC)    Hypertension    Memory loss     Past Surgical History:  Procedure Laterality Date   BACK SURGERY  1992   also 49    (Not in a hospital admission)  Social History   Socioeconomic History   Marital status: Married    Spouse name: Eber Jones   Number of children: Not on file   Years of education: Not on file   Highest education level: 12th grade  Occupational History   Occupation: Retired  Tobacco Use   Smoking status: Former    Types: Cigarettes   Smokeless tobacco: Never  Substance and Sexual Activity   Alcohol use: Not Currently   Drug use: Never   Sexual activity: Not Currently  Other Topics Concern   Not on file  Social History Narrative   Life with wife   Right handed   Drinks 5-6 cups caffeine daily   Social Determinants of Health   Financial Resource Strain: Low Risk  (08/30/2021)   Overall Financial Resource Strain (CARDIA)    Difficulty of Paying Living Expenses: Not hard at all  Food Insecurity: No Food Insecurity (08/30/2021)   Hunger Vital Sign    Worried About Running Out of Food in the Last Year: Never true    Ran Out of Food in the Last Year:  Never true  Transportation Needs: No Transportation Needs (08/30/2021)   PRAPARE - Administrator, Civil Service (Medical): No    Lack of Transportation (Non-Medical): No  Physical Activity: Inactive (08/30/2021)   Exercise Vital Sign    Days of Exercise per Week: 0 days    Minutes of Exercise per Session: 0 min  Stress: No Stress Concern Present (08/30/2021)   [employer] of Occupational Health - Occupational Stress Questionnaire    Feeling of Stress : Not at all  Social Connections: Socially Integrated (08/30/2021)   Social Connection and Isolation Panel [NHANES]    Frequency of Communication with Friends and Family: More than three times a week    Frequency of Social Gatherings with Friends and Family: Twice a week    Attends Religious Services: More than 4 times per year    Active Member of [employer] or Organizations: Yes    Attends Banker Meetings: More than 4 times per year    Marital Status: Married  Catering manager Violence: Not At Risk (08/30/2021)   Humiliation, Afraid, Rape, and Kick questionnaire    Fear of Current or Ex-Partner: No    Emotionally Abused: No    Physically Abused: No    Sexually Abused: No    History reviewed. No pertinent family history.    Review of systems complete and found to be negative unless listed above  PHYSICAL EXAM  General: Well developed, well nourished, in no acute distress HEENT:  Normocephalic and atramatic Neck:  Evaded JVD Lungs: Crackles heard Heart: Irregularly irregular rhythm.  Grade 2 systolic murmur left lower sternal border.  +2 to +3 pedal edema  Labs:   Lab Results  Component Value Date   WBC 8.4 07/19/2023   HGB 10.3 (L) 07/19/2023   HCT 32.4 (L) 07/19/2023   MCV 96.4 07/19/2023   PLT 272 07/19/2023    Recent Labs  Lab 07/19/23 2118 07/20/23 0300  NA 136  --   K 4.6  --   CL 104  --   CO2 25  --   BUN 42*  --   CREATININE 2.42*  --   CALCIUM 8.4*  --   PROT  --  6.9   BILITOT  --  0.5  ALKPHOS  --  97  ALT  --  12  AST  --  13*  GLUCOSE 207*  --    Lab Results  Component Value Date   CKTOTAL 141 06/15/2012   CKMB 2.3 06/15/2012   TROPONINI <0.03 12/29/2017   No results found for: "CHOL" No results found for: "HDL" No results found for: "LDLCALC" No results found for: "TRIG" No results found for: "CHOLHDL" No results found for: "LDLDIRECT"    Radiology: DG Chest 2 View  Result Date: 07/19/2023 CLINICAL DATA:  Shortness of breath for a couple of months. EXAM: CHEST - 2 VIEW COMPARISON:  January 01, 2023 FINDINGS: The cardiac silhouette is mildly enlarged and unchanged in size. Mild to moderate severity atelectasis and/or infiltrate is seen within the bilateral lung bases. Small bilateral pleural effusions are noted. No pneumothorax is identified. Multilevel degenerative changes are seen throughout the thoracic spine. IMPRESSION: 1. Mild to moderate severity bibasilar atelectasis and/or infiltrate. 2. Small bilateral pleural effusions. Electronically Signed   By: Aram Candela M.D.   On: 07/19/2023 22:50     ASSESSMENT AND PLAN:  Acute on chronic heart failure with reduced EF, LVEF 25 to 30%  Acute hypoxic respiratory failure A-fib with RVR AKI on CKD   IV diuresis with Lasix 80 mg twice daily.  Strict I's/O, daily weight and electrolytes. Continue metoprolol.  Can increase dose for better control of heart rate. Hold off lisinopril with acute kidney injury.  Can use hydralazine for blood pressure control. INR supratherapeutic.  Coumadin management per pharmacy  Signed: Kathryne Gin MD,PhD, [facility] 07/20/2023, 2:53 PM

## 2023-07-20 NOTE — Assessment & Plan Note (Signed)
-   We will continue anti-hypertensive therapy. 

## 2023-07-20 NOTE — ED Provider Notes (Signed)
[facility] Provider Note    Event Date/Time   First MD Initiated Contact with Patient 07/20/23 [phone]     (approximate)   History   Shortness of Breath   HPI  Bob Brewer is a 82 year old male with history of A-fib on warfarin, T2DM, HTN, CHF presenting to the emergency department for evaluation of shortness of breath.  Patient reports that for the past several months he has had progressive shortness of breath as well as worsening lower extremity swelling leading him to present to the ER.  No chest pain or fevers.  Does not wear oxygen at home.  Arrives on 3 L from EMS.    Physical Exam   Triage Vital Signs: ED Triage Vitals  Encounter Vitals Group     BP 07/19/23 2115 (!) 126/94     Systolic BP Percentile --      Diastolic BP Percentile --      Pulse Rate 07/19/23 2115 (!) 110     Resp 07/19/23 2115 20     Temp 07/19/23 2115 97.8 F (36.6 C)     Temp Source 07/19/23 2115 Oral     SpO2 07/19/23 2052 97 %     Weight --      Height --      Head Circumference --      Peak Flow --      Pain Score 07/19/23 2101 0     Pain Loc --      Pain Education --      Exclude from Growth Chart --     Most recent vital signs: Vitals:   07/20/23 0500 07/20/23 0530  BP: 107/76 123/79  Pulse: 75 86  Resp: (!) 40 (!) 24  Temp:    SpO2: 97% 95%     General: Awake, interactive  CV:  Tachycardic with irregularly irregular rhythm Resp:  Lung sounds diminished at bilateral bases, mild tachypnea Abd:  Soft, nontender, nondistended Neuro:  Symmetric facial movement, fluid speech MSK:  Bilateral lower extremity edema   ED Results / Procedures / Treatments   Labs (all labs ordered are listed, but only abnormal results are displayed) Labs Reviewed  BASIC METABOLIC PANEL - Abnormal; Notable for the following components:      Result Value   Glucose, Bld 207 (*)    BUN 42 (*)    Creatinine, Ser 2.42 (*)    Calcium 8.4 (*)    GFR, Estimated 26 (*)     All other components within normal limits  CBC - Abnormal; Notable for the following components:   RBC 3.36 (*)    Hemoglobin 10.3 (*)    HCT 32.4 (*)    All other components within normal limits  PROTIME-INR - Abnormal; Notable for the following components:   Prothrombin Time 50.3 (*)    INR 5.5 (*)    All other components within normal limits  BRAIN NATRIURETIC PEPTIDE - Abnormal; Notable for the following components:   B Natriuretic Peptide 1,713.1 (*)    All other components within normal limits  HEPATIC FUNCTION PANEL - Abnormal; Notable for the following components:   Albumin 2.9 (*)    AST 13 (*)    All other components within normal limits  TROPONIN I (HIGH SENSITIVITY) - Abnormal; Notable for the following components:   Troponin I (High Sensitivity) 21 (*)    All other components within normal limits  TROPONIN I (HIGH SENSITIVITY) - Abnormal; Notable for the following components:  Troponin I (High Sensitivity) 24 (*)    All other components within normal limits  RESP PANEL BY RT-PCR (RSV, FLU A&B, COVID)  RVPGX2  CULTURE, BLOOD (ROUTINE X 2)  CULTURE, BLOOD (ROUTINE X 2)  LACTIC ACID, PLASMA  LACTIC ACID, PLASMA  HEMOGLOBIN A1C     EKG EKG independently reviewed interpreted by myself (ER attending) demonstrates:  EKG demonstrates A-fib at a rate of 122, QRS 122, QTc 421, wide QRS complex with nonspecific ST changes  RADIOLOGY Imaging independently reviewed and interpreted by myself demonstrates:  Chest x-Jerri Hargadon with by basilar infiltrates, on my review or send on the left side compared to prior from earlier this year.  PROCEDURES:  Critical Care performed: Yes, see critical care procedure note(s)  CRITICAL CARE Performed by: Trinna Post   Total critical care time: 37 minutes  Critical care time was exclusive of separately billable procedures and treating other patients.  Critical care was necessary to treat or prevent imminent or life-threatening  deterioration.  Critical care was time spent personally by me on the following activities: development of treatment plan with patient and/or surrogate as well as nursing, discussions with consultants, evaluation of patient's response to treatment, examination of patient, obtaining history from patient or surrogate, ordering and performing treatments and interventions, ordering and review of laboratory studies, ordering and review of radiographic studies, pulse oximetry and re-evaluation of patient's condition.   Procedures   MEDICATIONS ORDERED IN ED: Medications  cefTRIAXone (ROCEPHIN) 2 g in sodium chloride 0.9 % 100 mL IVPB (0 g Intravenous Stopped 07/20/23 0225)  azithromycin (ZITHROMAX) 500 mg in dextrose 5 % 250 mL IVPB (0 mg Intravenous Stopped 07/20/23 0532)  lisinopril (ZESTRIL) tablet 40 mg (has no administration in time range)  metoprolol succinate (TOPROL-XL) 24 hr tablet 25 mg (has no administration in time range)  pravastatin (PRAVACHOL) tablet 40 mg (has no administration in time range)  spironolactone (ALDACTONE) tablet 25 mg (has no administration in time range)  donepezil (ARICEPT) tablet 10 mg (has no administration in time range)  memantine (NAMENDA) tablet 10 mg (has no administration in time range)  pantoprazole (PROTONIX) EC tablet 40 mg (has no administration in time range)  finasteride (PROSCAR) tablet 5 mg (has no administration in time range)  tamsulosin (FLOMAX) capsule 0.4 mg (has no administration in time range)  cyanocobalamin (VITAMIN B12) injection 1,000 mcg (has no administration in time range)  cholecalciferol (VITAMIN D3) 25 MCG (1000 UNIT) tablet 2,000 Units (has no administration in time range)  sodium chloride flush (NS) 0.9 % injection 10 mL (has no administration in time range)  insulin aspart (novoLOG) injection 0-9 Units (has no administration in time range)  acetaminophen (TYLENOL) tablet 650 mg (has no administration in time range)    Or   acetaminophen (TYLENOL) suppository 650 mg (has no administration in time range)  traZODone (DESYREL) tablet 25 mg (has no administration in time range)  magnesium hydroxide (MILK OF MAGNESIA) suspension 30 mL (has no administration in time range)  ondansetron (ZOFRAN) tablet 4 mg (has no administration in time range)    Or  ondansetron (ZOFRAN) injection 4 mg (has no administration in time range)  furosemide (LASIX) injection 40 mg (has no administration in time range)  guaiFENesin (MUCINEX) 12 hr tablet 600 mg (has no administration in time range)  chlorpheniramine-HYDROcodone (TUSSIONEX) 10-8 MG/5ML suspension 5 mL (has no administration in time range)  ipratropium-albuterol (DUONEB) 0.5-2.5 (3) MG/3ML nebulizer solution 3 mL (has no administration in time range)  ipratropium-albuterol (DUONEB)  0.5-2.5 (3) MG/3ML nebulizer solution 3 mL (has no administration in time range)  furosemide (LASIX) injection 40 mg (40 mg Intravenous Given 07/20/23 0539)  digoxin (LANOXIN) 0.25 MG/ML injection 0.25 mg (0.25 mg Intravenous Given 07/20/23 0714)     IMPRESSION / MDM / ASSESSMENT AND PLAN / ED COURSE  I reviewed the triage vital signs and the nursing notes.  Differential diagnosis includes, but is not limited to, CHF exacerbation, pneumonia, pneumothorax, ACS, low suspicion PE or aortic dissection with clinical history  Patient's presentation is most consistent with acute presentation with potential threat to life or bodily function.  82 year old male presenting to the emergency room for evaluation of ongoing shortness of breath.  Found to be in A-fib with RVR on presentation.  Initially or tachycardic in the 120s to 130s, but settled in the low 100s, will hold off on rate control at this time.  X-rays also concerning for pneumonia.  With tachypnea and tachycardia, does meet sepsis criteria.  Ordered for ceftriaxone and azithromycin.  I am concerned about CHF exacerbation with total volume overload,  so did hold off on fluids.  BNP did return elevated at 1700, ordered for dose of Lasix with suspected volume overload.  Labs also notable for AKI with creatinine of 2.42.  INR elevated at 5.5, no active bleeding.  With his shortness of breath, suspected volume overload, AKI, and possible pneumonia, do think he is appropriate for admission.  Will reach out to hospitalist team.  Reviewed with hospitalist team.  They will evaluate the patient for anticipated admission.     FINAL CLINICAL IMPRESSION(S) / ED DIAGNOSES   Final diagnoses:  Community acquired pneumonia of left lower lobe of lung  Acute on chronic congestive heart failure, unspecified heart failure type (HCC)     Rx / DC Orders   ED Discharge Orders     None        Note:  This document was prepared using Dragon voice recognition software and may include unintentional dictation errors.   Trinna Post, MD 07/20/23 [phone]

## 2023-07-20 NOTE — Progress Notes (Signed)
CODE SEPSIS - PHARMACY COMMUNICATION  **Broad Spectrum Antibiotics should be administered within 1 hour of Sepsis diagnosis**  Time Code Sepsis Called/Page Received: 0123  Antibiotics Ordered: Ceftriaxone & Azithromycin  Time of 1st antibiotic administration: 0159  Otelia Sergeant, PharmD, MBA 07/20/2023 1:30 AM

## 2023-07-20 NOTE — Assessment & Plan Note (Addendum)
Prior echocardiogram with EF of 25 to 30%, elevated BNP and clinically appears hypervolemic.  Started on IV diuresis. Symptoms are more concerning for acute on chronic HFrEF, cardiology was consulted.  Concern of low output heart failure with cold extremities. - Will continue diuresis with IV Lasix. - We will continue Aldactone and Toprol-XL. - Will monitor I's and O's. -Daily weight and BMP

## 2023-07-20 NOTE — ED Notes (Signed)
Pt moved up in bed by himself, HR increased to 130s and he became slightly SOB.

## 2023-07-20 NOTE — Assessment & Plan Note (Addendum)
Patient initially met SIRS criteria and there was a concern of pneumonia but likely bilateral atelectasis.  Procalcitonin negative and he does not have upper respiratory symptoms. Sepsis ruled out at this time and stopping IV antibiotics. Likely acute on chronic heart failure. -Continue supportive care and close monitoring

## 2023-07-20 NOTE — TOC Initial Note (Addendum)
Transition of Care [facility]) - Initial/Assessment Note    Patient Details  Name: Bob Brewer MRN: [id number] Date of Birth: 1941/05/22  Transition of Care [facility]) CM/SW Contact:    Marquita Palms, LCSW Phone Number: 07/20/2023, 10:00 AM  Clinical Narrative:                  CSW spoke with patient and wife bedside. Patient has recent visit to emergency department in the prior 6 months. Patients wife reported that he needs a wheelchair for when he is at home. She reported that they do not need any PT for home. No other needs at this time.        Patient Goals and CMS Choice    Baseline goals        Expected Discharge Plan and Services      Home with spouse                                         Prior Living Arrangements/Services    Home with spouse and caregivers                   Activities of Daily Living      Permission Sought/Granted                  Emotional Assessment              Admission diagnosis:  Sepsis due to pneumonia (HCC) [J18.9, A41.9] Patient Active Problem List   Diagnosis Date Noted   Sepsis due to pneumonia (HCC) 07/20/2023   Acute on chronic combined systolic and diastolic CHF (congestive heart failure) (HCC) 07/20/2023   Atrial fibrillation with RVR (HCC) 07/20/2023   Dyslipidemia 07/20/2023   GERD without esophagitis 07/20/2023   BPH (benign prostatic hyperplasia) 07/20/2023   Acute respiratory failure with hypoxia (HCC) 07/20/2023   Dyspnea 01/01/2023   Insomnia 08/30/2022   Alzheimer disease (HCC) 07/25/2022   Snoring 07/25/2022   Excessive daytime sleepiness 07/25/2022   Cervical spine arthritis 08/02/2020   Essential hypertension 08/02/2020   Diabetes mellitus (HCC) 07/15/2020   Annual physical exam 07/15/2020   Longstanding persistent atrial fibrillation (HCC) 07/15/2020   Abrasion of left arm 07/15/2020   Neck pain 02/23/2020   Memory loss 02/23/2020   PCP:  Corky Downs, MD Pharmacy:    [facility] - [place], [place] - [address] [place] [address] Phone: [phone] Fax: [phone]  [facility] - [place], [place] - [address] [place] [address] Phone: [phone] Fax: [phone]  [facility] [place], [place] - [address] [place] [address] Phone: [phone] Fax: [phone]  [facility] [address] [place] - [address] [place] [address] Phone: [phone] Fax: [phone]     Social Determinants of Health (SDOH) Social History: SDOH Screenings   Food Insecurity: No Food Insecurity (08/30/2021)  Housing: Low Risk  (08/30/2021)  Transportation Needs: No Transportation Needs (08/30/2021)  Alcohol Screen: Low Risk  (08/30/2021)  Depression (PHQ2-9): Low Risk  (11/21/2021)  Financial Resource Strain: Low Risk  (08/30/2021)  Physical Activity: Inactive (08/30/2021)  Social Connections: Socially Integrated (08/30/2021)  Stress: No Stress Concern Present (08/30/2021)  Tobacco Use: Medium Risk (07/19/2023)   SDOH Interventions:  Readmission Risk Interventions    07/20/2023    9:57 AM 07/20/2023    9:07 AM  Readmission Risk Prevention Plan  Transportation Screening Complete Complete  PCP or Specialist Appt within 3-5 Days Complete Complete  HRI or Home Care Consult Complete Complete  Social Work Consult for Recovery Care Planning/Counseling Complete Complete  Palliative Care Screening Patient Refused Complete  Medication Review Oceanographer) Complete Complete

## 2023-07-20 NOTE — H&P (Addendum)
[place]   PATIENT NAME: Bob Brewer    MR#:  [id number]  DATE OF BIRTH:  [birth year]  DATE OF ADMISSION:  07/20/2023  PRIMARY CARE PHYSICIAN: Corky Downs, MD   Patient is coming from: Home  REQUESTING/REFERRING PHYSICIAN: Trinna Post, MD  CHIEF COMPLAINT:   Chief Complaint  Patient presents with   Shortness of Breath    HISTORY OF PRESENT ILLNESS:  Bob Brewer is a 82 y.o. male with medical history significant for type 2 diabetes mellitus, combined systolic and diastolic CHF, and hypertension, presented to the ER with acute onset of worsening dyspnea which has been progressive lately with associated worsening lower extremity edema.  He denied any fever or chills.  No chest pain or palpitations.  He was hypoxic and came on 3 L of O2 by nasal cannula placed by EMS.  He is not on home oxygen.  No fever or chills.  No nausea or vomiting or abdominal pain.  No dysuria, oliguria or hematuria or flank pain.  He denied any paroxysmal dyspnea or worsening orthopnea.  He has been having dyspnea on exertion.  ED Course: When he came to the ER,, BP was 126/94 with heart rate 110 pulse symmetry is 95% on 3 L of O2 by nasal cannula.  Labs revealed a blood glucose of 2 7 and BUN of 42 with creatinine 2.42 and calcium 8.4.  BNP was 1713.  Lactic acid was 1 and later 1.2.  CBC showed anemia hemoglobin 10.3 and hematocrit 32.4 compared to 13.1 and 40.5 on 01/06/2023.  INR was 5.5 and PTT 50.3 on Coumadin.  Most recent 2D echo on 01/02/2023 revealed EF of 25 to 30% and grade 2 diastolic dysfunction EKG as reviewed by me : EKG showed atrial fibrillation with RVR of 122 and minimal voltage criteria for LVH with interventricular conduction delay. Imaging: Two-view chest x-ray showed mild to moderate severity bibasal atelectasis and/or infiltrates and small bilateral pleural effusions.  The patient was given IV Rocephin, Zithromax and 40 mg of IV Lasix.  He will be admitted to a progressive unit  bed for further evaluation and management. PAST MEDICAL HISTORY:   Past Medical History:  Diagnosis Date   Diabetes mellitus without complication (HCC)    Hypertension    Memory loss     PAST SURGICAL HISTORY:   Past Surgical History:  Procedure Laterality Date   BACK SURGERY  1992   also 1994    SOCIAL HISTORY:   Social History   Tobacco Use   Smoking status: Former    Types: Cigarettes   Smokeless tobacco: Never  Substance Use Topics   Alcohol use: Not Currently    FAMILY HISTORY:  History reviewed. No pertinent family history.  DRUG ALLERGIES:  No Known Allergies  REVIEW OF SYSTEMS:   ROS As per history of present illness. All pertinent systems were reviewed above. Constitutional, HEENT, cardiovascular, respiratory, GI, GU, musculoskeletal, neuro, psychiatric, endocrine, integumentary and hematologic systems were reviewed and are otherwise negative/unremarkable except for positive findings mentioned above in the HPI.   MEDICATIONS AT HOME:   Prior to Admission medications   Medication Sig Start Date End Date Taking? Authorizing Provider  Alcohol Swabs (ALCOHOL PREP) PADS 1 each by Does not apply route daily. 08/30/21   Corky Downs, MD  B-D TB SYRINGE 1CC/27GX1/2" 27G X 1/2" 1 ML MISC USE AS DIRECTED WITH  CYANOCOBALAMIN 07/03/22   Corky Downs, MD  Blood Glucose Monitoring Suppl (TRUE METRIX AIR  GLUCOSE METER) DEVI Use to check blood sugar 2 times daily 08/30/21   Corky Downs, MD  cyanocobalamin (VITAMIN B12) 1000 MCG/ML injection Inject 1 ml subcutaneously once a month 05/25/22   Corky Downs, MD  donepezil (ARICEPT) 10 MG tablet Take 1 tablet (10 mg total) by mouth daily. 05/30/23   Sater, Pearletha Furl, MD  finasteride (PROSCAR) 5 MG tablet TAKE 1 TABLET BY MOUTH  DAILY 01/09/22   Corky Downs, MD  glucose blood (RELION TRUE METRIX TEST STRIPS) test strip Check sugar 2 times daily 08/30/21   Corky Downs, MD  Lancets 33G MISC Check blood sugar 2 times daily  08/30/21   Corky Downs, MD  lisinopril (ZESTRIL) 40 MG tablet TAKE 1 TABLET BY MOUTH DAILY 06/11/23   Kara Dies, NP  memantine (NAMENDA) 10 MG tablet TAKE 1 TABLET BY MOUTH TWICE  DAILY 05/16/23   Sater, Pearletha Furl, MD  metFORMIN (GLUCOPHAGE) 500 MG tablet TAKE 1 TABLET BY MOUTH TWICE  DAILY WITH MEALS 07/03/22   Corky Downs, MD  metoprolol succinate (TOPROL-XL) 25 MG 24 hr tablet TAKE 1 TABLET BY MOUTH DAILY 07/03/22   Corky Downs, MD  omeprazole (PRILOSEC) 20 MG capsule TAKE 1 CAPSULE BY MOUTH DAILY 07/03/22   Corky Downs, MD  pravastatin (PRAVACHOL) 40 MG tablet TAKE 1 TABLET BY MOUTH DAILY 06/11/23   Kara Dies, NP  spironolactone (ALDACTONE) 25 MG tablet TAKE 1 TABLET BY MOUTH DAILY 07/03/22   Corky Downs, MD  tamsulosin (FLOMAX) 0.4 MG CAPS capsule TAKE 1 CAPSULE BY MOUTH  DAILY 01/09/22   Corky Downs, MD  torsemide (DEMADEX) 20 MG tablet Take 1 tablet (20 mg total) by mouth daily. 01/08/23 02/07/23  Charise Killian, MD  VITAMIN D PO Take 2,000 Units by mouth daily.    [provider]  warfarin (COUMADIN) 2.5 MG tablet Take 1 tablet (2.5 mg total) by mouth 2 (two) times a week. Take 2.5mg  in addition to the 5mg  on Saturday and Sunday 12/12/21   Corky Downs, MD  warfarin (COUMADIN) 5 MG tablet Take 1 tablet (5 mg total) by mouth daily. Take 5 mg Monday - Friday 12/12/21   Corky Downs, MD      VITAL SIGNS:  Blood pressure 123/79, pulse 86, temperature 98.3 F (36.8 C), temperature source Oral, resp. rate (!) 24, height 5\' 4"  (1.626 m), weight 70 kg, SpO2 95%.  PHYSICAL EXAMINATION:  Physical Exam  GENERAL:  82 y.o.-year-old patient lying lying in the bed with no respiratory distress with conversational dyspnea.Marland Kitchen  EYES: Pupils equal, round, reactive to light and accommodation. No scleral icterus. Extraocular muscles intact.  HEENT: Head atraumatic, normocephalic. Oropharynx and nasopharynx clear.  NECK:  Supple, no jugular venous distention. No thyroid  enlargement, no tenderness.  LUNGS: Diminished bibasal breath sounds with bibasal crackles.  No use of accessory muscles of respiration.  CARDIOVASCULAR: Regular rate and rhythm, S1, S2 normal. No murmurs, rubs, or gallops.  ABDOMEN: Soft, nondistended, nontender. Bowel sounds present. No organomegaly or mass.  EXTREMITIES: Bilateral lower extremity pitting 2+ edema with no cyanosis, or clubbing.  NEUROLOGIC: Cranial nerves II through XII are intact. Muscle strength 5/5 in all extremities. Sensation intact. Gait not checked.  PSYCHIATRIC: The patient is alert and oriented x 3.  Normal affect and good eye contact. SKIN: No obvious rash, lesion, or ulcer.   LABORATORY PANEL:   CBC Recent Labs  Lab 07/19/23 2118  WBC 8.4  HGB 10.3*  HCT 32.4*  PLT 272   ------------------------------------------------------------------------------------------------------------------  Chemistries  Recent Labs  Lab 07/19/23 2118 07/20/23 0300  NA 136  --   K 4.6  --   CL 104  --   CO2 25  --   GLUCOSE 207*  --   BUN 42*  --   CREATININE 2.42*  --   CALCIUM 8.4*  --   AST  --  13*  ALT  --  12  ALKPHOS  --  97  BILITOT  --  0.5   ------------------------------------------------------------------------------------------------------------------  Cardiac Enzymes No results for input(s): "TROPONINI" in the last 168 hours. ------------------------------------------------------------------------------------------------------------------  RADIOLOGY:  DG Chest 2 View  Result Date: 07/19/2023 CLINICAL DATA:  Shortness of breath for a couple of months. EXAM: CHEST - 2 VIEW COMPARISON:  January 01, 2023 FINDINGS: The cardiac silhouette is mildly enlarged and unchanged in size. Mild to moderate severity atelectasis and/or infiltrate is seen within the bilateral lung bases. Small bilateral pleural effusions are noted. No pneumothorax is identified. Multilevel degenerative changes are seen throughout the  thoracic spine. IMPRESSION: 1. Mild to moderate severity bibasilar atelectasis and/or infiltrate. 2. Small bilateral pleural effusions. Electronically Signed   By: Aram Candela M.D.   On: 07/19/2023 22:50      IMPRESSION AND PLAN:  Assessment and Plan: * Sepsis due to pneumonia [facility]) - The patient will be admitted to a medical telemetry bed. - Sepsis manifested by tachycardia and tachypnea. - Will continue antibiotic therapy with IV Rocephin and Zithromax. - Mucolytic therapy be provided as well as duo nebs q.i.d. and q.4 hours p.r.n. - We will follow blood cultures.   Acute on chronic combined systolic and diastolic CHF (congestive heart failure) (HCC) - Will continue diuresis with IV Lasix. - We will continue Aldactone and Toprol-XL. - Will monitor I's and O's. - We will avoid IV fluids fluids at this time as his sepsis is mild.  Atrial fibrillation with RVR (HCC) - Rate control is currently better. - We will utilize IV Cardizem as needed and digoxin with borderline BP. - INR is supratherapeutic and therefore we are holding off Coumadin. - We will continue Toprol-XL.  Acute respiratory failure with hypoxia (HCC) - This is likely secondary to her heart failure possibly pneumonia.  I do not believe it is related to sepsis and I do not believe he is in severe sepsis. - O2 protocol will be followed. - Management otherwise as above.  Essential hypertension - We will continue antihypertensive therapy.  BPH (benign prostatic hyperplasia) - We will continue Proscar.  GERD without esophagitis - We will continue PPI therapy.  Dyslipidemia - Continue statin therapy.  Memory loss - This could be associated with early dementia. - We will continue Namenda and Aricept..   DVT prophylaxis: The patient is on Coumadin with currently supratherapeutic INR. Advanced Care Planning:  Code Status: full code. Family Communication:  The plan of care was discussed in details with the  patient (and family). I answered all questions. The patient agreed to proceed with the above mentioned plan. Further management will depend upon hospital course. Disposition Plan: Back to previous home environment Consults called: none. All the records are reviewed and case discussed with ED provider.  Status is: Inpatient   At the time of the admission, it appears that the appropriate admission status for this patient is inpatient.  This is judged to be reasonable and necessary in order to provide the required intensity of service to ensure the patient's safety given the presenting symptoms, physical exam findings and initial radiographic and  laboratory data in the context of comorbid conditions.  The patient requires inpatient status due to high intensity of service, high risk of further deterioration and high frequency of surveillance required.  I certify that at the time of admission, it is my clinical judgment that the patient will require inpatient hospital care extending more than 2 midnights.                            Dispo: The patient is from: Home              Anticipated d/c is to: Home              Patient currently is not medically stable to d/c.              Difficult to place patient: No  Hannah Beat M.D on 07/20/2023 at 6:45 AM  [facility]   From 7 PM-7 AM, contact night-coverage [email or web address]  CC: Primary care physician; Corky Downs, MD

## 2023-07-21 ENCOUNTER — Inpatient Hospital Stay
Admit: 2023-07-21 | Discharge: 2023-07-21 | Disposition: A | Discharge status: Home / Self Care | Payer: Medicare Other | Attending: Internal Medicine | Admitting: Internal Medicine

## 2023-07-21 ENCOUNTER — Encounter: Payer: Self-pay | Admitting: Family Medicine

## 2023-07-21 DIAGNOSIS — Z515 Encounter for palliative care: Secondary | ICD-10-CM

## 2023-07-21 DIAGNOSIS — E785 Hyperlipidemia, unspecified: Secondary | ICD-10-CM

## 2023-07-21 DIAGNOSIS — N400 Enlarged prostate without lower urinary tract symptoms: Secondary | ICD-10-CM

## 2023-07-21 DIAGNOSIS — J9601 Acute respiratory failure with hypoxia: Secondary | ICD-10-CM | POA: Diagnosis not present

## 2023-07-21 DIAGNOSIS — I10 Essential (primary) hypertension: Secondary | ICD-10-CM | POA: Diagnosis not present

## 2023-07-21 DIAGNOSIS — J189 Pneumonia, unspecified organism: Secondary | ICD-10-CM | POA: Diagnosis not present

## 2023-07-21 DIAGNOSIS — I4891 Unspecified atrial fibrillation: Secondary | ICD-10-CM | POA: Diagnosis not present

## 2023-07-21 DIAGNOSIS — I5043 Acute on chronic combined systolic (congestive) and diastolic (congestive) heart failure: Secondary | ICD-10-CM | POA: Diagnosis not present

## 2023-07-21 DIAGNOSIS — E1169 Type 2 diabetes mellitus with other specified complication: Secondary | ICD-10-CM

## 2023-07-21 LAB — CBC
HCT: 31.9 % — ABNORMAL LOW (ref 39.0–52.0)
Hemoglobin: 10.2 g/dL — ABNORMAL LOW (ref 13.0–17.0)
MCH: 30.8 pg (ref 26.0–34.0)
MCHC: 32 g/dL (ref 30.0–36.0)
MCV: 96.4 fL (ref 80.0–100.0)
Platelets: 239 10*3/uL (ref 150–400)
RBC: 3.31 MIL/uL — ABNORMAL LOW (ref 4.22–5.81)
RDW: 13.6 % (ref 11.5–15.5)
WBC: 8.5 10*3/uL (ref 4.0–10.5)
nRBC: 0 % (ref 0.0–0.2)

## 2023-07-21 LAB — GLUCOSE, CAPILLARY
Glucose-Capillary: 121 mg/dL — ABNORMAL HIGH (ref 70–99)
Glucose-Capillary: 164 mg/dL — ABNORMAL HIGH (ref 70–99)
Glucose-Capillary: 271 mg/dL — ABNORMAL HIGH (ref 70–99)
Glucose-Capillary: 145 mg/dL — ABNORMAL HIGH (ref 70–99)
Glucose-Capillary: 167 mg/dL — ABNORMAL HIGH (ref 70–99)

## 2023-07-21 LAB — BASIC METABOLIC PANEL
Anion gap: 9 (ref 5–15)
BUN: 39 mg/dL — ABNORMAL HIGH (ref 8–23)
CO2: 25 mmol/L (ref 22–32)
Calcium: 8.6 mg/dL — ABNORMAL LOW (ref 8.9–10.3)
Chloride: 103 mmol/L (ref 98–111)
Creatinine, Ser: 2.35 mg/dL — ABNORMAL HIGH (ref 0.61–1.24)
GFR, Estimated: 27 mL/min — ABNORMAL LOW (ref >60)
Glucose, Bld: 131 mg/dL — ABNORMAL HIGH (ref 70–99)
Potassium: 4.7 mmol/L (ref 3.5–5.1)
Sodium: 137 mmol/L (ref 135–145)

## 2023-07-21 LAB — HEMOGLOBIN A1C
Hgb A1c MFr Bld: 7.6 % — ABNORMAL HIGH (ref 4.8–5.6)
Mean Plasma Glucose: 171.42 mg/dL

## 2023-07-21 LAB — PROTIME-INR
INR: 4.4 (ref 0.8–1.2)
Prothrombin Time: 42.2 s — ABNORMAL HIGH (ref 11.4–15.2)

## 2023-07-21 LAB — CORTISOL-AM, BLOOD: Cortisol - AM: 12.6 ug/dL (ref 6.7–22.6)

## 2023-07-21 LAB — PROCALCITONIN: Procalcitonin: <0.1 ng/mL

## 2023-07-21 MED ORDER — IPRATROPIUM-ALBUTEROL 0.5-2.5 (3) MG/3ML IN SOLN
3.0000 mL | Freq: Three times a day (TID) | RESPIRATORY_TRACT | Status: DC
  Administered 2023-07-21 – 2023-07-22 (×3): 3 mL via RESPIRATORY_TRACT
  Filled 2023-07-21 (×3): qty 3

## 2023-07-21 NOTE — Assessment & Plan Note (Signed)
Heart rate remained mildly elevated. Supratherapeutic INR with Coumadin, with some improvement to 4.4 today -Continue with home metoprolol -Coumadin per pharmacy -Cardiology was also consulted

## 2023-07-21 NOTE — Assessment & Plan Note (Signed)
Prior echocardiogram with EF of 25 to 30%, elevated BNP and clinically appears hypervolemic.  Started on IV diuresis.. - Will continue diuresis with IV Lasix. - We will continue Aldactone and Toprol-XL. - Will monitor I's and O's. -Daily weight and BMP

## 2023-07-21 NOTE — Consult Note (Signed)
Consultation Note Date: 07/21/2023   Patient Name: Bob Brewer  DOB: [birth year]  MRN: [id number]  Age / Sex: 82 y.o., male  PCP: Corky Downs, MD Referring Physician: Arnetha Courser, MD  Reason for Consultation: Establishing goals of care   HPI/Brief Hospital Course: 82 y.o. male  with past medical history of type 2 diabetes, combined systolic and diastolic CHF, A-fib on warfarin and hypertension admitted from home on 07/20/2023 with worsening dyspnea with associated bilateral lower extremity edema.  In ED he was found to have BNP of 1713 and EKG showing A-fib with RVR  He was admitted and being treated for sepsis possibly secondary to pneumonia as well as acute exacerbation of CHF  Palliative medicine was consulted for assisting with goals of care  Subjective:  Extensive chart review has been completed prior to meeting patient including labs, vital signs, imaging, progress notes, orders, and available advanced directive documents from current and previous encounters.  Visited with Mr. Hendricksen at his bedside.  He is awake, alert but unable to answer orientation questions appropriately.  Later returned to bedside when wife [place] visiting.  Introduced myself as a Publishing rights manager as a member of the palliative care team. Explained palliative medicine is specialized medical care for people living with serious illness. It focuses on providing relief from the symptoms and stress of a serious illness. The goal is to improve quality of life for both the patient and the family.   Eber Jones shares a brief life review, she and Mr. Boniface have been married for 34 years. They do not have children together, but Mr. Heupel has a son-Kevin and daughter-Dale from a previous marriage. 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 He has not completed advanced directives in the past but has shared Eber Jones would be his  Runner, broadcasting/film/video.  Eber Jones shares Mr. Lamb originally diagnosed with dementia about 3-4 year ago and is followed by a neurologist Dr. Epimenio Foot. Eber Jones shares over the last 6 months she has noticed a significant cognitive decline. At baseline, Eber Jones assists Mr. Laufer with the majority of his ADL's. She also shares over the last several months his appetite and PO intake has significantly declined.  Eber Jones shares she was present at bedside when cardiology rounded yesterday. She is aware of his underlying cardiac issues and shares she is aware of how weakened his heart is.  Attempted to elicit goals of care conversations with Eber Jones. We discuss code status and the difference between Full Code versus Do Not Resuscitate. Encouraged Eber Jones to consider DNR/DNI status understanding evidenced based poor outcomes in similar hospitalized patients, as the cause of the arrest is likely associated with chronic/terminal disease rather than a reversible acute cardio-pulmonary event. Eber Jones shares she feels DNR/DNI is most appropriate at this time. DNR form completed and scanned into chart for Vynca review.  Eber Jones shares Mr. Biedermann has been followed by hospice services in the past at home but progressed and was ultimately discharged from their services. Shared with Eber Jones at this time, Mr. Morken would again be appropriate for hospice services if she feels that would be the most appropriate route. At this time, Eber Jones would like to monitor Mr. Lunt progression over the next day or so before making further goals of care conversations.  I discussed importance of continued conversations with family/support persons and all members of their medical team regarding overall plan of care and treatment options ensuring decisions are in alignment with patients  goals of care.  All questions/concerns addressed. Emotional support provided to patient/family/support persons. PMT will continue to follow and  support patient as needed.  Objective: Primary Diagnoses: Present on Admission:  Sepsis due to pneumonia (HCC)  Acute on chronic combined systolic and diastolic CHF (congestive heart failure) (HCC)  Essential hypertension  Memory loss   Physical Exam Constitutional:      General: He is not in acute distress.    Appearance: He is not ill-appearing.  Pulmonary:     Effort: Pulmonary effort is normal. No respiratory distress.  Skin:    General: Skin is warm and dry.  Neurological:     Mental Status: He is alert. He is disoriented.     Vital Signs: BP 113/67 (BP Location: Left Arm)   Pulse 87   Temp (!) 97.5 F (36.4 C)   Resp 20   Ht 5\' 4"  (1.626 m)   Wt 75.1 kg   SpO2 97%   BMI 28.43 kg/m  Pain Scale: 0-10   Pain Score: 0-No pain  IO: Intake/output summary:  Intake/Output Summary (Last 24 hours) at 07/21/2023 1618 Last data filed at 07/21/2023 1235 Gross per 24 hour  Intake 1157 ml  Output 1025 ml  Net 132 ml    LBM: Last BM Date :  (PTA) Baseline Weight: Weight: 70 kg Most recent weight: Weight: 75.1 kg       Palliative Assessment/Data: 50%   Assessment and Plan  SUMMARY OF RECOMMENDATIONS   DNR/DNI Time for outcomes PMT to continue to follow for ongoing needs and support  Palliative Prophylaxis:   Bowel Regimen, Delirium Protocol and Frequent Pain Assessment  Discussed With: Primary team   Thank you for this consult and allowing Palliative Medicine to participate in the care of Prince [place]. Palliative medicine will continue to follow and assist as needed.   Time Total: 75 minutes  Time spent includes: Detailed review of medical records (labs, imaging, vital signs), medically appropriate exam (mental status, respiratory, cardiac, skin), discussed with treatment team, counseling and educating patient, family and staff, documenting clinical information, medication management and coordination of care.   Signed by: Leeanne Deed, DNP,  AGNP-C Palliative Medicine    Please contact Palliative Medicine Team phone at [phone] for questions and concerns.  For individual provider: See Loretha Stapler

## 2023-07-21 NOTE — Assessment & Plan Note (Signed)
Patient initially met SIRS criteria and there was a concern of pneumonia but likely bilateral atelectasis.  Procalcitonin negative and he does not have upper respiratory symptoms. Sepsis ruled out at this time and stopping IV antibiotics. Likely acute on chronic heart failure. -Continue supportive care and close monitoring

## 2023-07-21 NOTE — Consult Note (Signed)
PHARMACY - ANTICOAGULATION CONSULT NOTE  Pharmacy Consult for Warfarin Indication: atrial fibrillation  No Known Allergies  Patient Measurements: Height: 5\' 4"  (162.6 cm) Weight: 75.1 kg (165 lb 9.6 oz) IBW/kg (Calculated) : 59.2 Heparin Dosing Weight: 70 kg  Vital Signs: Temp: 98 F (36.7 C) (11/02 0758) Temp Source: Oral (11/02 0758) BP: 107/83 (11/02 0758) Pulse Rate: 107 (11/02 0758)  Labs: Recent Labs    07/19/23 0110 07/19/23 2118 07/20/23 0300 07/21/23 0625  HGB  --  10.3*  --  10.2*  HCT  --  32.4*  --  31.9*  PLT  --  272  --  239  LABPROT  --   --  50.3* 42.2*  INR  --   --  5.5* 4.4*  CREATININE  --  2.42*  --  2.35*  TROPONINIHS 24* 21*  --   --     Estimated Creatinine Clearance: 22.9 mL/min (A) (by C-G formula based on SCr of 2.35 mg/dL (H)).   Medical History: Past Medical History:  Diagnosis Date   Diabetes mellitus without complication (HCC)    Hypertension    Memory loss     Medications:  Warfarin 2.5mg  daily, unsure of last dose taken.  Assessment: 82 y.o. male with medical history significant for type 2 diabetes mellitus, combined systolic and diastolic CHF, atrial fibrillation(on warfarin) and hypertension, presented to the ER with acute onset of worsening dyspnea which has been progressive lately with associated worsening lower extremity edema. Cardiology and palliative care was consulted. EKG shows Afib with RVR. Pharmacy has been consulted to restart warfarin.  Goal of Therapy:  INR 2-3 Monitor platelets by anticoagulation protocol: Yes  Date INR Dose  11/1 5.5 held  11/2 4.4 held          Plan:  - INR supratherapeutic - Will hold warfarin and recheck level tomorrow - INR/CBC daily  Levante Simones A Yanni Quiroa 07/21/2023,11:59 AM

## 2023-07-21 NOTE — Progress Notes (Signed)
Progress Note   Patient: Bob Brewer MVH:[id number] DOB: Sep 04, 1941 DOA: 07/20/2023     1 DOS: the patient was seen and examined on 07/21/2023   Brief hospital course: Taken from H&P.  Bob Brewer is a 82 y.o. male with medical history significant for type 2 diabetes mellitus, combined systolic and diastolic CHF, and hypertension, presented to the ER with acute onset of worsening dyspnea which has been progressive lately with associated worsening lower extremity edema.  He denied any fever or chills.  No chest pain or palpitations.  He was hypoxic and came on 3 L of O2 by nasal cannula placed by EMS.  He is not on home oxygen.   On arrival to ED he was tachycardic at 110, tachypneic and saturating in mid 90s on 3 L of oxygen.  Labs pertinent for BUN of 42, creatinine 2.42, BNP 1713, hemoglobin 10.3, INR 5.5 on Coumadin.  Most recent echo on 01/02/2023 revealed EF of 25 to 30% and grade 2 diastolic dysfunction.   Chest x-ray with mild to moderate bibasilar atelectasis and/or infiltrate and small bilateral pleural effusion. EKG shows A-fib with RVR,  He was started on ceftriaxone and Zithromax along with IV diuresis.  11/1: Vital stable.  Likely acute on chronic HFrEF, remained very short of breath with colder extremities, likely low output heart failure.  Procalcitonin negative so sepsis ruled out and antibiotics are being discontinued.  Cardiology and palliative care was consulted, patient currently wants to remain full code. Repeat echo was ordered.  11/2: Heart rate remained mildly elevated, improving renal function with creatinine at 2.35 today.  INR with some improvement but remained supratherapeutic at 4.4.  Oxygen requirement improved to 2 L.  Pending repeat echo.  CODE STATUS changed to DNR with full scope of medical care.  Continuing IV diuresis   Assessment and Plan: * Acute on chronic combined systolic and diastolic CHF (congestive heart failure) (HCC) Prior echocardiogram  with EF of 25 to 30%, elevated BNP and clinically appears hypervolemic.  Started on IV diuresis.. - Will continue diuresis with IV Lasix. - We will continue Aldactone and Toprol-XL. - Will monitor I's and O's. -Daily weight and BMP  Sepsis due to pneumonia [facility]) Patient initially met SIRS criteria and there was a concern of pneumonia but likely bilateral atelectasis.  Procalcitonin negative and he does not have upper respiratory symptoms. Sepsis ruled out at this time and stopping IV antibiotics. Likely acute on chronic heart failure. -Continue supportive care and close monitoring  Atrial fibrillation with RVR (HCC) Heart rate remained mildly elevated. Supratherapeutic INR with Coumadin, with some improvement to 4.4 today -Continue with home metoprolol -Coumadin per pharmacy -Cardiology was also consulted  Acute respiratory failure with hypoxia (HCC) Likely secondary to heart failure.  No baseline oxygen use.  Currently on 2 L -Continue with supplemental oxygen-wean as tolerated  Essential hypertension - We will continue antihypertensive therapy.  Dyslipidemia - Continue statin therapy.  Diabetes mellitus (HCC) Patient was on metformin at home. -SSI  Memory loss - This could be associated with early dementia. - We will continue Namenda and Aricept.Marland Kitchen  GERD without esophagitis - We will continue PPI therapy.  BPH (benign prostatic hyperplasia) - We will continue Proscar.   Subjective: Patient with improved shortness of breath.  Continue to have significant lower extremity edema but improving.  Remained on 2 L of oxygen.  No chest pain  Physical Exam: Vitals:   07/21/23 0741 07/21/23 0758 07/21/23 1211 07/21/23 1354  BP:  107/83 113/67   Pulse:  (!) 107 87   Resp:  19 20   Temp:  98 F (36.7 C) (!) 97.5 F (36.4 C)   TempSrc:  Oral    SpO2: 100% 100%  97%  Weight:      Height:       General. Frail elderly man, in no acute distress. Pulmonary.  Few basal  crackles bilaterally, normal respiratory effort. CV.  Regular rate and rhythm, no JVD, rub or murmur. Abdomen.  Soft, nontender, nondistended, BS positive. CNS.  Alert and oriented .  No focal neurologic deficit. Extremities.  1+ LE edema, no cyanosis, pulses intact and symmetrical. Psychiatry.  Judgment and insight appears normal.   Data Reviewed: Prior data reviewed  Family Communication: Discussed with wife at bedside  Disposition: Status is: Inpatient Remains inpatient appropriate because: Severity of illness  Planned Discharge Destination:  To be determined  Time spent: 50  minutes  This record has been created using Conservation officer, historic buildings. Errors have been sought and corrected,but may not always be located. Such creation errors do not reflect on the standard of care.   Author: Arnetha Courser, MD 07/21/2023 4:18 PM  For on call review [email or web address].

## 2023-07-21 NOTE — Progress Notes (Signed)
Patient in chair. Wanted ECHO later.   Bob Brewer RVT RCS

## 2023-07-21 NOTE — Progress Notes (Addendum)
[facility] Cardiology    SUBJECTIVE: Patient states shortness of breath improved still has lower extremity swelling patient has had some incontinence overnight and would like a condom cath denies any chest pain   Vitals:   07/21/23 0127 07/21/23 0603 07/21/23 0741 07/21/23 0758  BP: 107/77 115/76  107/83  Pulse: (!) 55 95  (!) 107  Resp: 20 (!) 22  19  Temp: 98.5 F (36.9 C) 98.6 F (37 C)  98 F (36.7 C)  TempSrc:  Oral  Oral  SpO2: 100% 100% 100% 100%  Weight:      Height:         Intake/Output Summary (Last 24 hours) at 07/21/2023 1101 Last data filed at 07/21/2023 0400 Gross per 24 hour  Intake 720 ml  Output 675 ml  Net 45 ml      PHYSICAL EXAM  General: Well developed, well nourished, in no acute distress HEENT:  Normocephalic and atramatic Neck:  No JVD.  Lungs: Clear bilaterally to auscultation and percussion. Heart: Irregular irregular. Normal S1 and S2 without gallops or murmurs.  Abdomen: Bowel sounds are positive, abdomen soft and non-tender  Msk:  Back normal, normal gait. Normal strength and tone for age. Extremities: No clubbing, cyanosis or edema.   Neuro: Alert and oriented X 3. Psych:  Good affect, responds appropriately   LABS: Basic Metabolic Panel: Recent Labs    07/19/23 2118 07/21/23 0625  NA 136 137  K 4.6 4.7  CL 104 103  CO2 25 25  GLUCOSE 207* 131*  BUN 42* 39*  CREATININE 2.42* 2.35*  CALCIUM 8.4* 8.6*   Liver Function Tests: Recent Labs    07/20/23 0300  AST 13*  ALT 12  ALKPHOS 97  BILITOT 0.5  PROT 6.9  ALBUMIN 2.9*   No results for input(s): "LIPASE", "AMYLASE" in the last 72 hours. CBC: Recent Labs    07/19/23 2118 07/21/23 0625  WBC 8.4 8.5  HGB 10.3* 10.2*  HCT 32.4* 31.9*  MCV 96.4 96.4  PLT 272 239   Cardiac Enzymes: No results for input(s): "CKTOTAL", "CKMB", "CKMBINDEX", "TROPONINI" in the last 72 hours. BNP: Invalid input(s): "POCBNP" D-Dimer: No results for input(s): "DDIMER" in the last 72  hours. Hemoglobin A1C: No results for input(s): "HGBA1C" in the last 72 hours. Fasting Lipid Panel: No results for input(s): "CHOL", "HDL", "LDLCALC", "TRIG", "CHOLHDL", "LDLDIRECT" in the last 72 hours. Thyroid Function Tests: No results for input(s): "TSH", "T4TOTAL", "T3FREE", "THYROIDAB" in the last 72 hours.  Invalid input(s): "FREET3" Anemia Panel: No results for input(s): "VITAMINB12", "FOLATE", "FERRITIN", "TIBC", "IRON", "RETICCTPCT" in the last 72 hours.  DG Chest 2 View  Result Date: 07/19/2023 CLINICAL DATA:  Shortness of breath for a couple of months. EXAM: CHEST - 2 VIEW COMPARISON:  January 01, 2023 FINDINGS: The cardiac silhouette is mildly enlarged and unchanged in size. Mild to moderate severity atelectasis and/or infiltrate is seen within the bilateral lung bases. Small bilateral pleural effusions are noted. No pneumothorax is identified. Multilevel degenerative changes are seen throughout the thoracic spine. IMPRESSION: 1. Mild to moderate severity bibasilar atelectasis and/or infiltrate. 2. Small bilateral pleural effusions. Electronically Signed   By: Aram Candela M.D.   On: 07/19/2023 22:50     Echo failure reduced left ventricular function EF around 25 to 30%  TELEMETRY: Atrial fibrillation rapid ventricular response left bundle branch block rate of about 100:  ASSESSMENT AND PLAN:  Principal Problem:   Acute on chronic combined systolic and diastolic CHF (congestive heart  failure) (HCC) Active Problems:   Memory loss   Diabetes mellitus (HCC)   Essential hypertension   Sepsis due to pneumonia (HCC)   Atrial fibrillation with RVR (HCC)   Dyslipidemia   GERD without esophagitis   BPH (benign prostatic hyperplasia)   Acute respiratory failure with hypoxia (HCC)    Plan Acute on chronic congestive heart failure EF around 25 to 30% Acute hypoxic respiratory failure continue aggressive medical therapy including diuresis failure management Cardiomyopathy  EF around 25 to 30% continue aggressive medical therapy Elevated BNP continue heart failure therapy including diuresis Atrial fibrillation RVR continue rate control anticoagulation Renal insufficiency continue adequate hydration have patient follow-up with nephrology Diabetes type 2 uncomplicated continue current medical therapy Hypertension reasonably controlled continue aggressive hypertension management Hyperlipidemia continue Lipitor therapy for lipid management Incontinence recommend condom cath Physical and Occupational Therapy Continue support stockings elevation diuretics for lower extremity edema Continue current medical therapy we will have the patient follow-up with heart failure clinic as an outpatient    Alwyn Pea, MD 07/21/2023 11:01 AM

## 2023-07-21 NOTE — Plan of Care (Signed)
  Problem: Education: Goal: Ability to describe self-care measures that may prevent or decrease complications (Diabetes Survival Skills Education) will improve Outcome: Progressing Goal: Individualized Educational Video(s) Outcome: Progressing   Problem: Coping: Goal: Ability to adjust to condition or change in health will improve Outcome: Progressing   Problem: Fluid Volume: Goal: Ability to maintain a balanced intake and output will improve Outcome: Progressing   Problem: Health Behavior/Discharge Planning: Goal: Ability to identify and utilize available resources and services will improve Outcome: Progressing Goal: Ability to manage health-related needs will improve Outcome: Progressing   Problem: Metabolic: Goal: Ability to maintain appropriate glucose levels will improve Outcome: Progressing   Problem: Nutritional: Goal: Maintenance of adequate nutrition will improve Outcome: Progressing   Problem: Skin Integrity: Goal: Risk for impaired skin integrity will decrease Outcome: Progressing   Problem: Tissue Perfusion: Goal: Adequacy of tissue perfusion will improve Outcome: Progressing   Problem: Fluid Volume: Goal: Hemodynamic stability will improve Outcome: Progressing   Problem: Clinical Measurements: Goal: Diagnostic test results will improve Outcome: Progressing   Problem: Respiratory: Goal: Ability to maintain adequate ventilation will improve Outcome: Progressing   Problem: Clinical Measurements: Goal: Will remain free from infection Outcome: Progressing Goal: Cardiovascular complication will be avoided Outcome: Progressing   Problem: Elimination: Goal: Will not experience complications related to urinary retention Outcome: Progressing   Problem: Safety: Goal: Ability to remain free from injury will improve Outcome: Progressing   Problem: Skin Integrity: Goal: Risk for impaired skin integrity will decrease Outcome: Progressing

## 2023-07-21 NOTE — Plan of Care (Signed)

## 2023-07-21 NOTE — Assessment & Plan Note (Signed)
Likely secondary to heart failure.  No baseline oxygen use.  Currently on 2 L -Continue with supplemental oxygen-wean as tolerated -Patient continued to desaturate with ambulation although saturating well at rest on room air.  2 L of oxygen for home was ordered.

## 2023-07-22 ENCOUNTER — Inpatient Hospital Stay
Admit: 2023-07-22 | Discharge: 2023-07-22 | Disposition: A | Discharge status: Home / Self Care | Payer: Medicare Other | Attending: Internal Medicine | Admitting: Internal Medicine

## 2023-07-22 DIAGNOSIS — I4891 Unspecified atrial fibrillation: Secondary | ICD-10-CM | POA: Diagnosis not present

## 2023-07-22 DIAGNOSIS — I5043 Acute on chronic combined systolic (congestive) and diastolic (congestive) heart failure: Secondary | ICD-10-CM | POA: Diagnosis not present

## 2023-07-22 DIAGNOSIS — R413 Other amnesia: Secondary | ICD-10-CM

## 2023-07-22 DIAGNOSIS — J9601 Acute respiratory failure with hypoxia: Secondary | ICD-10-CM | POA: Diagnosis not present

## 2023-07-22 DIAGNOSIS — J189 Pneumonia, unspecified organism: Secondary | ICD-10-CM | POA: Diagnosis not present

## 2023-07-22 DIAGNOSIS — Z515 Encounter for palliative care: Secondary | ICD-10-CM | POA: Diagnosis not present

## 2023-07-22 LAB — PROTIME-INR
INR: 3.1 — ABNORMAL HIGH (ref 0.8–1.2)
Prothrombin Time: 32.5 s — ABNORMAL HIGH (ref 11.4–15.2)

## 2023-07-22 LAB — CBC
HCT: 30.3 % — ABNORMAL LOW (ref 39.0–52.0)
Hemoglobin: 10 g/dL — ABNORMAL LOW (ref 13.0–17.0)
MCH: 30.6 pg (ref 26.0–34.0)
MCHC: 33 g/dL (ref 30.0–36.0)
MCV: 92.7 fL (ref 80.0–100.0)
Platelets: 249 10*3/uL (ref 150–400)
RBC: 3.27 MIL/uL — ABNORMAL LOW (ref 4.22–5.81)
RDW: 13.5 % (ref 11.5–15.5)
WBC: 9.1 10*3/uL (ref 4.0–10.5)
nRBC: 0 % (ref 0.0–0.2)

## 2023-07-22 LAB — ECHOCARDIOGRAM COMPLETE
AR max vel: 2.23 cm2
AV Area VTI: 2.25 cm2
AV Area mean vel: 2.12 cm2
AV Mean grad: 3 mm[Hg]
AV Peak grad: 5.3 mm[Hg]
Ao pk vel: 1.15 m/s
Area-P 1/2: 4.6 cm2
Height: 64 in
S' Lateral: 3.2 cm
Weight: 2649.6 [oz_av]

## 2023-07-22 LAB — BASIC METABOLIC PANEL
Anion gap: 7 (ref 5–15)
BUN: 42 mg/dL — ABNORMAL HIGH (ref 8–23)
CO2: 28 mmol/L (ref 22–32)
Calcium: 8.7 mg/dL — ABNORMAL LOW (ref 8.9–10.3)
Chloride: 103 mmol/L (ref 98–111)
Creatinine, Ser: 2.42 mg/dL — ABNORMAL HIGH (ref 0.61–1.24)
GFR, Estimated: 26 mL/min — ABNORMAL LOW (ref >60)
Glucose, Bld: 153 mg/dL — ABNORMAL HIGH (ref 70–99)
Potassium: 5 mmol/L (ref 3.5–5.1)
Sodium: 138 mmol/L (ref 135–145)

## 2023-07-22 LAB — GLUCOSE, CAPILLARY
Glucose-Capillary: 121 mg/dL — ABNORMAL HIGH (ref 70–99)
Glucose-Capillary: 289 mg/dL — ABNORMAL HIGH (ref 70–99)
Glucose-Capillary: 148 mg/dL — ABNORMAL HIGH (ref 70–99)
Glucose-Capillary: 134 mg/dL — ABNORMAL HIGH (ref 70–99)

## 2023-07-22 MED ORDER — WARFARIN - PHARMACIST DOSING INPATIENT: Freq: Every day | Status: DC

## 2023-07-22 MED ORDER — TORSEMIDE 20 MG PO TABS
20.0000 mg | ORAL_TABLET | Freq: Every day | ORAL | Status: DC
  Administered 2023-07-22 – 2023-07-23 (×2): 20 mg via ORAL
  Filled 2023-07-22 (×2): qty 1

## 2023-07-22 MED ORDER — DAPAGLIFLOZIN PROPANEDIOL 5 MG PO TABS
5.0000 mg | ORAL_TABLET | Freq: Every day | ORAL | Status: DC
  Administered 2023-07-22 – 2023-07-23 (×2): 5 mg via ORAL
  Filled 2023-07-22 (×2): qty 1

## 2023-07-22 MED ORDER — FUROSEMIDE 10 MG/ML IJ SOLN
40.0000 mg | Freq: Two times a day (BID) | INTRAMUSCULAR | Status: DC
  Administered 2023-07-22: 40 mg via INTRAVENOUS
  Filled 2023-07-22: qty 4

## 2023-07-22 MED ORDER — WARFARIN SODIUM 1 MG PO TABS
1.0000 mg | ORAL_TABLET | Freq: Once | ORAL | Status: AC
  Administered 2023-07-22: 1 mg via ORAL
  Filled 2023-07-22: qty 1

## 2023-07-22 MED ORDER — MIDODRINE HCL 5 MG PO TABS
5.0000 mg | ORAL_TABLET | Freq: Two times a day (BID) | ORAL | Status: DC
  Administered 2023-07-22 – 2023-07-23 (×2): 5 mg via ORAL
  Filled 2023-07-22 (×2): qty 1

## 2023-07-22 NOTE — Progress Notes (Signed)
    Durable Medical Equipment  (From admission, onward)           Start     Ordered   07/22/23 1223  For home use only DME lightweight manual wheelchair with seat cushion  Once       Comments: Patient suffers from heart failure and generalized weakness with advanced age which impairs their ability to perform daily activities like bathing, dressing, feeding, grooming, and toileting in the home.  A walker will not resolve  issue with performing activities of daily living. A wheelchair will allow patient to safely perform daily activities. Patient is not able to propel themselves in the home using a standard weight wheelchair due to general weakness. Patient can self propel in the lightweight wheelchair. Length of need Lifetime. Accessories: elevating leg rests (ELRs), wheel locks, extensions and anti-tippers.   07/22/23 1223   07/22/23 1137  For home use only DME oxygen  Once       Question Answer Comment  Length of Need Lifetime   Mode or (Route) Nasal cannula   Liters per Minute 2   Frequency Continuous (stationary and portable oxygen unit needed)   Oxygen conserving device Yes   Oxygen delivery system Gas      07/22/23 1136          Pet PT, note pending:  "Performed O2 Qualification Test today: pt at rest on RA at 95% SpO2, first bout of walking 30' on RA pt desated to 84%. Second bout of walking 30' on 1 L O2 pt sats at 93%. Final bout of 80' on 1 L pt at 92%. Pt was left on RA with 93% SpO2 at end of session."

## 2023-07-22 NOTE — Progress Notes (Signed)
[facility] Cardiology    SUBJECTIVE: Patient resting comfortably reduced shortness of breath able to ambulate without dyspnea no leg edema denies chest pain feels much improved from yesterday feels well enough for outpatient management and follow-up with cardiology as an outpatient   Vitals:   07/22/23 0836 07/22/23 0953 07/22/23 1005 07/22/23 1303  BP: 112/76   (!) 89/59  Pulse: (!) 52 (!) 104 (!) 104 87  Resp: 15   16  Temp: 98.1 F (36.7 C)   98.4 F (36.9 C)  TempSrc:    Oral  SpO2: 99%  96% 97%  Weight:      Height:         Intake/Output Summary (Last 24 hours) at 07/22/2023 1304 Last data filed at 07/22/2023 0400 Gross per 24 hour  Intake --  Output 1800 ml  Net -1800 ml      PHYSICAL EXAM  General: Well developed, well nourished, in no acute distress HEENT:  Normocephalic and atramatic Neck:  No JVD.  Lungs: Clear bilaterally to auscultation and percussion. Heart: Irregular irregular. Normal S1 and S2 without gallops or murmurs.  Abdomen: Bowel sounds are positive, abdomen soft and non-tender  Msk:  Back normal, normal gait. Normal strength and tone for age. Extremities: No clubbing, cyanosis or edema.   Neuro: Alert and oriented X 3. Psych:  Good affect, responds appropriately   LABS: Basic Metabolic Panel: Recent Labs    07/21/23 0625 07/22/23 0459  NA 137 138  K 4.7 5.0  CL 103 103  CO2 25 28  GLUCOSE 131* 153*  BUN 39* 42*  CREATININE 2.35* 2.42*  CALCIUM 8.6* 8.7*   Liver Function Tests: Recent Labs    07/20/23 0300  AST 13*  ALT 12  ALKPHOS 97  BILITOT 0.5  PROT 6.9  ALBUMIN 2.9*   No results for input(s): "LIPASE", "AMYLASE" in the last 72 hours. CBC: Recent Labs    07/21/23 0625 07/22/23 0459  WBC 8.5 9.1  HGB 10.2* 10.0*  HCT 31.9* 30.3*  MCV 96.4 92.7  PLT 239 249   Cardiac Enzymes: No results for input(s): "CKTOTAL", "CKMB", "CKMBINDEX", "TROPONINI" in the last 72 hours. BNP: Invalid input(s): "POCBNP" D-Dimer: No  results for input(s): "DDIMER" in the last 72 hours. Hemoglobin A1C: Recent Labs    07/21/23 0625  HGBA1C 7.6*   Fasting Lipid Panel: No results for input(s): "CHOL", "HDL", "LDLCALC", "TRIG", "CHOLHDL", "LDLDIRECT" in the last 72 hours. Thyroid Function Tests: No results for input(s): "TSH", "T4TOTAL", "T3FREE", "THYROIDAB" in the last 72 hours.  Invalid input(s): "FREET3" Anemia Panel: No results for input(s): "VITAMINB12", "FOLATE", "FERRITIN", "TIBC", "IRON", "RETICCTPCT" in the last 72 hours.  No results found.   Echo ejection fraction reduced to around 25 to 30%  TELEMETRY: Atrial fibrillation rate controlled of around 95:  ASSESSMENT AND PLAN:  Principal Problem:   Acute on chronic combined systolic and diastolic CHF (congestive heart failure) (HCC) Active Problems:   Memory loss   Diabetes mellitus (HCC)   Essential hypertension   Sepsis due to pneumonia (HCC)   Atrial fibrillation with RVR (HCC)   Dyslipidemia   GERD without esophagitis   BPH (benign prostatic hyperplasia)   Acute respiratory failure with hypoxia (HCC)      Plan Acute on chronic combined systolic and diastolic congestive heart failure much improved continue medical therapy diuretics ACE inhibitor beta-blocker Chronic renal insufficiency stage IIIb GFR 22 maintain adequate perfusion hydration follow-up with nephrology Atrial fibrillation anticoagulation with warfarin rate control Diabetes type  2 continue current medical therapy consider adding Farxiga Hyperlipidemia continue statin therapy for lipid management COPD continue inhalers as necessary for shortness of breath symptoms Continue physical and Occupational Therapy ambulate with assistance Have the patient follow-up with heart failure clinic this week on Tuesday, November 5 at 3 PM at [place] clinic   Cardiac meds on discharge Farxiga 5 mg a day Warfarin anticoagulation for A-fib Pravachol 40 mg daily Spironolactone 25 mg  daily Torsemide 20 mg daily Lisinopril 40 mg daily Metoprolol 25 mg daily Midodrine 5 mg twice a day   Alwyn Pea, MD 07/22/2023 1:04 PM

## 2023-07-22 NOTE — Progress Notes (Signed)
*  PRELIMINARY RESULTS* Echocardiogram 2D Echocardiogram has been performed.  Bob Brewer 07/22/2023, 9:05 AM

## 2023-07-22 NOTE — Evaluation (Signed)
Physical Therapy Evaluation Patient Details Name: Bob Brewer MRN: [id number] DOB: 02/15/1941 Today's Date: 07/22/2023  History of Present Illness  Pt is a 82 y.o. male with medical history significant for type 2 diabetes mellitus, combined systolic and diastolic CHF, HTN presented to the ER with acute onset of worsening dyspnea which has been progressive lately with associated worsening lower extremity edema. Current MD assessment includes acute CHF flareup, EF 25-30%, likely bilateral atelectasis, afib, acute respiratory failure, memory loss, GERD, and BPH.  Clinical Impression  Pt was pleasant and motivated to participate during the session and put forth good effort throughout. Pt was able to answer questions and spouse in room aided prn. Pt required no physical assist for bed mobility and min physical assist for transfers. Pt required CGA during amb for safety and sequencing. O2 Qualification Test was completed: pt found at rest on RA at 95% SpO2. First bout of walking 30' on RA pt desated to 84%. Second bout of walking 30' on 1 L O2 pt at 93% SpO2. Final bout of 80' on 1 L pt at 92%. Pt tolerated treatment well and reported no adverse symptoms throughout, minimal SOB was observed with gait on RA. Pt was left on RA at end of session. Pt will benefit from continued PT services upon discharge to safely address deficits listed in patient problem list for decreased caregiver assistance and return to PLOF.     If plan is discharge home, recommend the following: A little help with walking and/or transfers;Assistance with cooking/housework;Direct supervision/assist for medications management;Direct supervision/assist for financial management;Assist for transportation;A little help with bathing/dressing/bathroom   Can travel by private vehicle        Equipment Recommendations Rolling walker (2 wheels)  Recommendations for Other Services       Functional Status Assessment Patient has had a  recent decline in their functional status and demonstrates the ability to make significant improvements in function in a reasonable and predictable amount of time.     Precautions / Restrictions Precautions Precautions: Fall Restrictions Weight Bearing Restrictions: No      Mobility  Bed Mobility Overal bed mobility: Needs Assistance Bed Mobility: Supine to Sit     Supine to sit: HOB elevated, Used rails, Supervision     General bed mobility comments: Pt able to complete with extra time    Transfers Overall transfer level: Needs assistance Equipment used: Rolling walker (2 wheels) Transfers: Sit to/from Stand Sit to Stand: Min assist           General transfer comment: Multimodal cuing for hand and RW placement, PT assist level decreased with subsequent attempts    Ambulation/Gait Ambulation/Gait assistance: Contact guard assist Gait Distance (Feet): 30 Feet x2, 80 Feet x1 Assistive device: Rolling walker (2 wheels) Gait Pattern/deviations: Step-through pattern, Decreased step length - right, Decreased step length - left, Decreased stride length, Trunk flexed Gait velocity: decreased     General Gait Details: repetitive cuing for feet placement in RW, exhibited wide turns  Careers information officer     Tilt Bed    Modified Rankin (Stroke Patients Only)       Balance Overall balance assessment: Needs assistance Sitting-balance support: Feet supported, Single extremity supported Sitting balance-Leahy Scale: Good Sitting balance - Comments: able to scoot at EOB without assist   Standing balance support: Bilateral upper extremity supported, During functional activity Standing balance-Leahy Scale: Fair Standing balance comment: steady in static stand  with RW                             Pertinent Vitals/Pain Pain Assessment Pain Assessment: No/denies pain    Home Living Family/patient expects to be discharged to:: Private  residence Living Arrangements: Spouse/significant other Available Help at Discharge: Family;Available PRN/intermittently;Personal care attendant (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 (present in room) able to be show up every day) Type of Home: House Home Access: Level entry       Home Layout: One level Home Equipment: Grab bars - tub/shower;Cane - single point;Shower seat - built in;Rollator (4 wheels)      Prior Function Prior Level of Function : Needs assist       Physical Assist : ADLs (physical);Mobility (physical) Mobility (physical): Gait;Transfers ADLs (physical): Bathing;IADLs Mobility Comments: mod I with household amb, uses walls/furniture surfs; does not go out due to mobility deficits       Extremity/Trunk Assessment   Upper Extremity Assessment Upper Extremity Assessment: Overall WFL for tasks assessed    Lower Extremity Assessment Lower Extremity Assessment: Overall WFL for tasks assessed;Generalized weakness    Cervical / Trunk Assessment Cervical / Trunk Assessment: Kyphotic  Communication   Communication Communication: No apparent difficulties;Other (comment) (HOH) Cueing Techniques: Verbal cues;Tactile cues  Cognition Arousal: Alert Behavior During Therapy: WFL for tasks assessed/performed Overall Cognitive Status: Within Functional Limits for tasks assessed                                          General Comments      Exercises Other Exercises Other Exercises: Educated on use of RW for improved function/safety with pt and spouse. Answered spouse's questions regarding use of wheelchair/transport chair for mobility and community access.   Assessment/Plan    PT Assessment Patient needs continued PT services  PT Problem List Decreased strength;Decreased mobility;Decreased range of motion;Decreased activity tolerance;Decreased balance;Decreased knowledge of use of DME;Decreased coordination;Cardiopulmonary status limiting activity       PT  Treatment Interventions DME instruction;Therapeutic exercise;Balance training;Gait training;Functional mobility training;Therapeutic activities;Patient/family education    PT Goals (Current goals can be found in the Care Plan section)  Acute Rehab PT Goals Patient Stated Goal: get stronger PT Goal Formulation: With patient/family Time For Goal Achievement: 08/04/23 Potential to Achieve Goals: Good    Frequency Min 1X/week     Co-evaluation               AM-PAC PT "6 Clicks" Mobility  Outcome Measure Help needed turning from your back to your side while in a flat bed without using bedrails?: None Help needed moving from lying on your back to sitting on the side of a flat bed without using bedrails?: A Little Help needed moving to and from a bed to a chair (including a wheelchair)?: A Little Help needed standing up from a chair using your arms (e.g., wheelchair or bedside chair)?: A Little Help needed to walk in hospital room?: A Little Help needed climbing 3-5 steps with a railing? : A Little 6 Click Score: 19    End of Session Equipment Utilized During Treatment: Gait belt;Oxygen (1 L) Activity Tolerance: Patient tolerated treatment well Patient left: in chair;with call bell/phone within reach;with chair alarm set;with family/visitor present Nurse Communication: Mobility status;Other (comment) (O2 Qualification Test) PT Visit Diagnosis: Difficulty in walking, not elsewhere classified (R26.2);Muscle weakness (generalized) (M62.81)  Time: 1031-1110 PT Time Calculation (min) (ACUTE ONLY): 39 min   Charges:               Rosiland Oz SPT 07/22/23, 12:34 PM

## 2023-07-22 NOTE — Progress Notes (Signed)
                                                                                                                                                                                                           Daily Progress Note   Patient Name: Bob Brewer       Date: 07/22/2023 DOB: [birth year]  Age: 82 y.o. MRN#: [id number] Attending Physician: Arnetha Courser, MD Primary Care Physician: Corky Downs, MD Admit Date: 07/20/2023  Reason for Consultation/Follow-up: Establishing goals of care  HPI/Brief Hospital Review: 82 y.o. male  with past medical history of type 2 diabetes, combined systolic and diastolic CHF, A-fib on warfarin and hypertension admitted from home on 07/20/2023 with worsening dyspnea with associated bilateral lower extremity edema.   In ED he was found to have BNP of 1713 and EKG showing A-fib with RVR   He was admitted and being treated for sepsis possibly secondary to pneumonia as well as acute exacerbation of CHF   Palliative medicine was consulted for assisting with goals of care  Subjective: Extensive chart review has been completed prior to meeting patient including labs, vital signs, imaging, progress notes, orders, and available advanced directive documents from current and previous encounters.    Labs and clinical presentation improving.  Visited with Bob Brewer at his bedside his wife Eber Jones is present during time of visit.  Eber Jones shares plan for likely discharge home tomorrow pending oxygen delivery.  Discussed the role of outpatient palliative care with Eber Jones for which she feels would be appropriate for Bob Brewer at this time. TOC referral placed.  No further acute palliative needs at this time.  Answered and addressed all questions and concerns.  Thank you for allowing the Palliative Medicine Team to assist in the care of this patient.  Total time:  25 minutes  Time spent includes: Detailed review of medical records (labs, imaging, vital signs),  medically appropriate exam (mental status, respiratory, cardiac, skin), discussed with treatment team, counseling and educating patient, family and staff, documenting clinical information, medication management and coordination of care.  Leeanne Deed, DNP, AGNP-C Palliative Medicine   Please contact Palliative Medicine Team phone at [phone] for questions and concerns.

## 2023-07-22 NOTE — Consult Note (Signed)
PHARMACY - ANTICOAGULATION CONSULT NOTE  Pharmacy Consult for Warfarin Indication: atrial fibrillation  No Known Allergies  Patient Measurements: Height: 5\' 4"  (162.6 cm) Weight: 75.1 kg (165 lb 9.6 oz) IBW/kg (Calculated) : 59.2 Heparin Dosing Weight: 70 kg  Vital Signs: Temp: 98.1 F (36.7 C) (11/03 0836) Temp Source: Oral (11/03 0400) BP: 112/76 (11/03 0836) Pulse Rate: 52 (11/03 0836)  Labs: Recent Labs    07/19/23 2118 07/20/23 0300 07/21/23 0625 07/22/23 0459  HGB 10.3*  --  10.2* 10.0*  HCT 32.4*  --  31.9* 30.3*  PLT 272  --  239 249  LABPROT  --  50.3* 42.2* 32.5*  INR  --  5.5* 4.4* 3.1*  CREATININE 2.42*  --  2.35* 2.42*  TROPONINIHS 21*  --   --   --     Estimated Creatinine Clearance: 22.2 mL/min (A) (by C-G formula based on SCr of 2.42 mg/dL (H)).   Medical History: Past Medical History:  Diagnosis Date   Diabetes mellitus without complication (HCC)    Hypertension    Memory loss     Medications:  Warfarin 2.5mg  daily, but patient unsure if this correct or when his last dose taken was  Assessment: 82 y.o. male with medical history significant for type 2 diabetes mellitus, combined systolic and diastolic CHF, atrial fibrillation(on warfarin) and hypertension, presented to the ER with acute onset of worsening dyspnea which has been progressive lately with associated worsening lower extremity edema. Cardiology and palliative care was consulted. EKG shows Afib with RVR. Pharmacy has been consulted to restart warfarin.  Goal of Therapy:  INR 2-3 Monitor platelets by anticoagulation protocol: Yes  Date INR Dose  11/1 5.5 held  11/2 4.4 held  11/3 3.1       Plan:  - INR supratherapeutic but trending down quickly. - Will order warfarin 1mg  for today since dose has been held for 2 days prior and likelihood that INR will continue to drop tomorrow. - INR/CBC daily  Malanie Koloski A Aiesha Leland 07/22/2023,9:47 AM

## 2023-07-22 NOTE — Progress Notes (Signed)
OT Cancellation Note  Patient Details Name: Bob Brewer MRN: [id number] DOB: Apr 02, 1941   Cancelled Treatment:    Reason Eval/Treat Not Completed: OT screened, no needs identified, will sign off. Chart reviewed, pt in recliner with spouse present. Pt adamantly refuses OT eval at this time, stating he is at his baseline and is going home today (reports MOD I with ADLs, spouse also helps with bathing). OT provides education on role and purpose of acute care OT, and pt continues to refuse. Spouse states that she would like to get a wheelchair for pt as he is unable to walk long distances without SOB. OT to complete orders. Please re-consult if changes arise.   Jerrol Helmers L. Mustafa Potts, OTR/L  07/22/23, 11:45 AM

## 2023-07-22 NOTE — Progress Notes (Signed)
Progress Note   Patient: Bob Brewer JXB:[id number] DOB: 01/17/1941 DOA: 07/20/2023     2 DOS: the patient was seen and examined on 07/22/2023   Brief hospital course: Taken from H&P.  Bob Brewer is a 82 y.o. male with medical history significant for type 2 diabetes mellitus, combined systolic and diastolic CHF, and hypertension, presented to the ER with acute onset of worsening dyspnea which has been progressive lately with associated worsening lower extremity edema.  He denied any fever or chills.  No chest pain or palpitations.  He was hypoxic and came on 3 L of O2 by nasal cannula placed by EMS.  He is not on home oxygen.   On arrival to ED he was tachycardic at 110, tachypneic and saturating in mid 90s on 3 L of oxygen.  Labs pertinent for BUN of 42, creatinine 2.42, BNP 1713, hemoglobin 10.3, INR 5.5 on Coumadin.  Most recent echo on 01/02/2023 revealed EF of 25 to 30% and grade 2 diastolic dysfunction.   Chest x-ray with mild to moderate bibasilar atelectasis and/or infiltrate and small bilateral pleural effusion. EKG shows A-fib with RVR,  He was started on ceftriaxone and Zithromax along with IV diuresis.  11/1: Vital stable.  Likely acute on chronic HFrEF, remained very short of breath with colder extremities, likely low output heart failure.  Procalcitonin negative so sepsis ruled out and antibiotics are being discontinued.  Cardiology and palliative care was consulted, patient currently wants to remain full code. Repeat echo was ordered.  11/2: Heart rate remained mildly elevated, improving renal function with creatinine at 2.35 today.  INR with some improvement but remained supratherapeutic at 4.4.  Oxygen requirement improved to 2 L.  Pending repeat echo.  CODE STATUS changed to DNR with full scope of medical care.  Continuing IV diuresis  11/3: Vitals mostly stable, improving INR to 3.1 today, slight worsening of BUN/creatinine, decreasing the dose of IV Lasix to 40 mg  twice daily instead of 80.  PT is recommending home health.  Also still desaturate with ambulation-DME oxygen ordered. Likely be discharged tomorrow   Assessment and Plan: * Acute on chronic combined systolic and diastolic CHF (congestive heart failure) (HCC) Prior echocardiogram with EF of 25 to 30%, elevated BNP and clinically appears hypervolemic.  Started on IV diuresis.. - Will continue diuresis with IV Lasix-dose decreased to 40 mg twice daily today, he will be discharged on low-dose torsemide per cardiology. - We will continue Aldactone and Toprol-XL. - Will monitor I's and O's. -Daily weight and BMP  Sepsis due to pneumonia [facility]) Patient initially met SIRS criteria and there was a concern of pneumonia but likely bilateral atelectasis.  Procalcitonin negative and he does not have upper respiratory symptoms. Sepsis ruled out at this time and stopping IV antibiotics. Likely acute on chronic heart failure. -Continue supportive care and close monitoring  Atrial fibrillation with RVR (HCC) Heart rate remained mildly elevated. Supratherapeutic INR with Coumadin, with some improvement to 4.4 today -Continue with home metoprolol -Coumadin per pharmacy -Cardiology was also consulted  Acute respiratory failure with hypoxia (HCC) Likely secondary to heart failure.  No baseline oxygen use.  Currently on 2 L -Continue with supplemental oxygen-wean as tolerated -Patient continued to desaturate with ambulation although saturating well at rest on room air.  2 L of oxygen for home was ordered.  Essential hypertension - We will continue antihypertensive therapy.  Dyslipidemia - Continue statin therapy.  Diabetes mellitus (HCC) Patient was on metformin at home. -  SSI  Memory loss - This could be associated with early dementia. - We will continue Namenda and Aricept.Marland Kitchen  GERD without esophagitis - We will continue PPI therapy.  BPH (benign prostatic hyperplasia) - We will continue  Proscar.   Subjective: Patient was feeling much improved and working with PT when seen today.  No new concern.  Physical Exam: Vitals:   07/22/23 0836 07/22/23 0953 07/22/23 1005 07/22/23 1303  BP: 112/76   (!) 89/59  Pulse: (!) 52 (!) 104 (!) 104 87  Resp: 15   16  Temp: 98.1 F (36.7 C)   98.4 F (36.9 C)  TempSrc:    Oral  SpO2: 99%  96% 97%  Weight:      Height:       General.  Frail elderly man, in no acute distress. Pulmonary.  Lungs clear bilaterally, normal respiratory effort. CV.  Regular rate and rhythm, no JVD, rub or murmur. Abdomen.  Soft, nontender, nondistended, BS positive. CNS.  Alert and oriented .  No focal neurologic deficit. Extremities.  Trace LE edema, no cyanosis, pulses intact and symmetrical. Psychiatry.  Judgment and insight appears normal.   Data Reviewed: Prior data reviewed  Family Communication: Discussed with wife at bedside  Disposition: Status is: Inpatient Remains inpatient appropriate because: Severity of illness  Planned Discharge Destination:  To be determined  Time spent: 45  minutes  This record has been created using Conservation officer, historic buildings. Errors have been sought and corrected,but may not always be located. Such creation errors do not reflect on the standard of care.   Author: Arnetha Courser, MD 07/22/2023 2:30 PM  For on call review [email or web address].

## 2023-07-22 NOTE — Progress Notes (Addendum)
PT Progress Note  Patient Details Name: Bob Brewer MRN: [id number] DOB: 08/01/1941   Addendum:     SaO2 on room air at rest = 95% SaO2 on room air while ambulating = 84% SaO2 on 1 liters of O2 while ambulating = 92%    D. Scott Katheryne Gorr PT, DPT 07/22/23, 1:22 PM

## 2023-07-22 NOTE — TOC Transition Note (Signed)
Transition of Care [facility]) - CM/SW Discharge Note   Patient Details  Name: Bob Brewer MRN: [id number] Date of Birth: [birth year]  Transition of Care [facility]) CM/SW Contact:  Bing Quarry, RN Phone Number: 07/22/2023, 1:00 PM   Clinical Narrative:  11/3: Patient admitted from home via EMS for htx of SOB and LE edema. Dx: was Acute on Chronic combined CHF. EKG afib RVR per cardiology notes. Was not on chronic oxygen pta. Cardiology and palliative was consulted in hospital setting. WC order placed, narrative entered, and ordered via [employer]. Home DME oxygen saturation test in, DME oxygen orders relayed to [employer].   Gabriel Cirri MSN RN CM  Care Management Department.  [place]  [facility] Direct Dial: [phone] Main Office Phone: [phone] Weekends Only     Barriers to Discharge: No Barriers Identified   Patient Goals and CMS Choice CMS Medicare.gov Compare Post Acute Care list provided to:: Patient Represenative (must comment) (Spouse - Eber Jones) Choice offered to / list presented to : Patient   Discharge Plan and Services Additional resources added to the After Visit Summary for                  DME Arranged: Wheelchair manual DME Agency: [employer] Date DME Agency Contacted: 07/22/23 Time DME Agency Contacted: [id number] Representative spoke with at DME Agency: [facility] Arranged:  ([facility])       Representative spoke with at [facility] Agency: NA  Social Determinants of Health (SDOH) Interventions SDOH Screenings   Food Insecurity: No Food Insecurity (07/20/2023)  Housing: Low Risk  (07/20/2023)  Transportation Needs: No Transportation Needs (07/20/2023)  Utilities: Not At Risk (07/20/2023)  Alcohol Screen: Low Risk  (08/30/2021)  Depression (PHQ2-9): Low Risk  (11/21/2021)  Financial Resource Strain: Low Risk  (08/30/2021)  Physical Activity: Inactive (08/30/2021)  Social Connections: Socially Integrated (08/30/2021)  Stress: No Stress Concern Present (08/30/2021)   Tobacco Use: Medium Risk (07/21/2023)     Readmission Risk Interventions    07/20/2023    9:57 AM 07/20/2023    9:07 AM  Readmission Risk Prevention Plan  Transportation Screening Complete Complete  PCP or Specialist Appt within 3-5 Days Complete Complete  HRI or Home Care Consult Complete Complete  Social Work Consult for Recovery Care Planning/Counseling Complete Complete  Palliative Care Screening Patient Refused Complete  Medication Review Oceanographer) Complete Complete

## 2023-07-23 DIAGNOSIS — I509 Heart failure, unspecified: Secondary | ICD-10-CM | POA: Diagnosis not present

## 2023-07-23 DIAGNOSIS — J9601 Acute respiratory failure with hypoxia: Secondary | ICD-10-CM | POA: Diagnosis not present

## 2023-07-23 DIAGNOSIS — I5043 Acute on chronic combined systolic (congestive) and diastolic (congestive) heart failure: Secondary | ICD-10-CM | POA: Diagnosis not present

## 2023-07-23 DIAGNOSIS — I4891 Unspecified atrial fibrillation: Secondary | ICD-10-CM | POA: Diagnosis not present

## 2023-07-23 DIAGNOSIS — K219 Gastro-esophageal reflux disease without esophagitis: Secondary | ICD-10-CM

## 2023-07-23 LAB — BASIC METABOLIC PANEL
Anion gap: 11 (ref 5–15)
BUN: 41 mg/dL — ABNORMAL HIGH (ref 8–23)
CO2: 26 mmol/L (ref 22–32)
Calcium: 8.7 mg/dL — ABNORMAL LOW (ref 8.9–10.3)
Chloride: 103 mmol/L (ref 98–111)
Creatinine, Ser: 2.35 mg/dL — ABNORMAL HIGH (ref 0.61–1.24)
GFR, Estimated: 27 mL/min — ABNORMAL LOW (ref >60)
Glucose, Bld: 132 mg/dL — ABNORMAL HIGH (ref 70–99)
Potassium: 4.5 mmol/L (ref 3.5–5.1)
Sodium: 140 mmol/L (ref 135–145)

## 2023-07-23 LAB — GLUCOSE, CAPILLARY: Glucose-Capillary: 136 mg/dL — ABNORMAL HIGH (ref 70–99)

## 2023-07-23 LAB — PROTIME-INR
INR: 2.1 — ABNORMAL HIGH (ref 0.8–1.2)
Prothrombin Time: 23.4 s — ABNORMAL HIGH (ref 11.4–15.2)

## 2023-07-23 MED ORDER — TORSEMIDE 20 MG PO TABS: 20.0000 mg | ORAL_TABLET | Freq: Every day | ORAL | 1 refills | Status: AC

## 2023-07-23 MED ORDER — MEMANTINE HCL 10 MG PO TABS: 10.0000 mg | ORAL_TABLET | Freq: Two times a day (BID) | ORAL | 3 refills | Status: DC

## 2023-07-23 MED ORDER — DAPAGLIFLOZIN PROPANEDIOL 5 MG PO TABS: 5.0000 mg | ORAL_TABLET | Freq: Every day | ORAL | 1 refills | Status: DC

## 2023-07-23 MED ORDER — MIDODRINE HCL 5 MG PO TABS: 5.0000 mg | ORAL_TABLET | Freq: Two times a day (BID) | ORAL | 1 refills | Status: DC

## 2023-07-23 MED ORDER — WARFARIN SODIUM 2.5 MG PO TABS
2.5000 mg | ORAL_TABLET | Freq: Once | ORAL | Status: DC
  Filled 2023-07-23: qty 1

## 2023-07-23 MED ORDER — LOSARTAN POTASSIUM 25 MG PO TABS: 25.0000 mg | ORAL_TABLET | Freq: Every day | ORAL | Status: DC

## 2023-07-23 MED ORDER — WARFARIN SODIUM 2.5 MG PO TABS: 2.5000 mg | ORAL_TABLET | Freq: Every day | ORAL | 3 refills | Status: DC

## 2023-07-23 MED ORDER — LOSARTAN POTASSIUM 25 MG PO TABS: 25.0000 mg | ORAL_TABLET | Freq: Every day | ORAL | 1 refills | Status: DC

## 2023-07-23 NOTE — TOC Transition Note (Addendum)
Transition of Care [facility]) - CM/SW Discharge Note   Patient Details  Name: Bob Brewer MRN: [id number] Date of Birth: 02/20/1941  [facility] of [facility]) CM/SW Contact:  Truddie Hidden, RN Phone Number: 07/23/2023, 11:09 AM   Clinical Narrative:    Spoke with patient's wife regarding discharge plans. She confirmed his oxygen was delivered to his room.Patient is agreeable to [facility] and does not have a preference of an agency.Referral sent and accepted by  Morrie Sheldon from  [facility].  Spoke with Cletis [place] from [employer]. WC and RW are processing and will be delivered to patient's home.    TOC siging off       Barriers to Discharge: No Barriers Identified   Patient Goals and CMS Choice CMS Medicare.gov Compare Post Acute Care list provided to:: Patient Represenative (must comment) (Spouse - Eber Jones) Choice offered to / list presented to : Patient  Discharge Placement                         Discharge Plan and Services Additional resources added to the After Visit Summary for                  DME Arranged: Wheelchair manual DME Agency: [employer] Date DME Agency Contacted: 07/22/23 Time DME Agency Contacted: [id number] Representative spoke with at DME Agency: [facility]ranged:  ([facility])       Representative spoke with at [facility] Agency: NA  Social Determinants of Health (SDOH) Interventions SDOH Screenings   Food Insecurity: No Food Insecurity (07/20/2023)  Housing: Low Risk  (07/20/2023)  Transportation Needs: No Transportation Needs (07/20/2023)  Utilities: Not At Risk (07/20/2023)  Alcohol Screen: Low Risk  (08/30/2021)  Depression (PHQ2-9): Low Risk  (11/21/2021)  Financial Resource Strain: Low Risk  (08/30/2021)  Physical Activity: Inactive (08/30/2021)  Social Connections: Socially Integrated (08/30/2021)  Stress: No Stress Concern Present (08/30/2021)  Tobacco Use: Medium Risk (07/21/2023)     Readmission Risk Interventions    07/20/2023    9:57 AM 07/20/2023     9:07 AM  Readmission Risk Prevention Plan  Transportation Screening Complete Complete  PCP or Specialist Appt within 3-5 Days Complete Complete  HRI or Home Care Consult Complete Complete  Social Work Consult for Recovery Care Planning/Counseling Complete Complete  Palliative Care Screening Patient Refused Complete  Medication Review Oceanographer) Complete Complete

## 2023-07-23 NOTE — Consult Note (Signed)
PHARMACY - ANTICOAGULATION CONSULT NOTE  Pharmacy Consult for Warfarin Indication: atrial fibrillation  No Known Allergies  Patient Measurements: Height: 5\' 4"  (162.6 cm) Weight: 72.8 kg (160 lb 7.9 oz) IBW/kg (Calculated) : 59.2 Heparin Dosing Weight: 70 kg  Vital Signs: Temp: 98.3 F (36.8 C) (11/04 0738) BP: 123/90 (11/04 0738) Pulse Rate: 94 (11/04 0738)  Labs: Recent Labs    07/21/23 0625 07/22/23 0459 07/23/23 0454  HGB 10.2* 10.0*  --   HCT 31.9* 30.3*  --   PLT 239 249  --   LABPROT 42.2* 32.5* 23.4*  INR 4.4* 3.1* 2.1*  CREATININE 2.35* 2.42* 2.35*    Estimated Creatinine Clearance: 22.5 mL/min (A) (by C-G formula based on SCr of 2.35 mg/dL (H)).   Medical History: Past Medical History:  Diagnosis Date   Diabetes mellitus without complication (HCC)    Hypertension    Memory loss     Medications:  Warfarin 2.5mg  daily, but patient unsure if this correct or when his last dose taken was  Assessment: 82 y.o. male with medical history significant for type 2 diabetes mellitus, combined systolic and diastolic CHF, atrial fibrillation(on warfarin) and hypertension, presented to the ER with acute onset of worsening dyspnea which has been progressive lately with associated worsening lower extremity edema. Cardiology and palliative care was consulted. EKG shows Afib with RVR. Pharmacy has been consulted to restart warfarin.  Goal of Therapy:  INR 2-3 Monitor platelets by anticoagulation protocol: Yes  Date INR Dose  11/1 5.5 held  11/2 4.4 held  11/3 3.1 1 mg  11/4 2.1 2.5 mg      Plan:  INR is therapeutic but trending down. Predict INR to trend down tomorrow due to held doses. Will give warfarin 2.5 mg x 1 tonight. Daily INR. CBC at lease ever 3 days.   Discharge recommendation: warfarin 2.5 mg daily with close follow up. Pt was 5 mg back in April. With the supratherapeutic INR on admission, I think it would be safe to back down to 2.5 mg daily due to  commodities and age.   Ronnald Ramp, PharmD, BCPS 07/23/2023,10:35 AM

## 2023-07-23 NOTE — Discharge Summary (Signed)
Physician Discharge Summary   Patient: Bob Brewer MRN: [id number] DOB: 1940-11-25  Admit date:     07/20/2023  Discharge date: 07/23/23  Discharge Physician: Arnetha Courser   PCP: Corky Downs, MD   Recommendations at discharge:  Please obtain CBC and BMP on follow-up Please check INR in 3 days and adjust Coumadin dose Follow-up with primary care provider Follow-up with cardiology  Discharge Diagnoses: Principal Problem:   Acute on chronic combined systolic and diastolic CHF (congestive heart failure) (HCC) Active Problems:   Sepsis due to pneumonia [facility])   Atrial fibrillation with RVR (HCC)   Acute respiratory failure with hypoxia (HCC)   Essential hypertension   Diabetes mellitus (HCC)   Dyslipidemia   Memory loss   GERD without esophagitis   BPH (benign prostatic hyperplasia)   Acute on chronic congestive heart failure [facility])   Hospital Course: Taken from H&P.  Bob Brewer is a 82 y.o. male with medical history significant for type 2 diabetes mellitus, combined systolic and diastolic CHF, and hypertension, presented to the ER with acute onset of worsening dyspnea which has been progressive lately with associated worsening lower extremity edema.  He denied any fever or chills.  No chest pain or palpitations.  He was hypoxic and came on 3 L of O2 by nasal cannula placed by EMS.  He is not on home oxygen.   On arrival to ED he was tachycardic at 110, tachypneic and saturating in mid 90s on 3 L of oxygen.  Labs pertinent for BUN of 42, creatinine 2.42, BNP 1713, hemoglobin 10.3, INR 5.5 on Coumadin.  Most recent echo on 01/02/2023 revealed EF of 25 to 30% and grade 2 diastolic dysfunction.   Chest x-ray with mild to moderate bibasilar atelectasis and/or infiltrate and small bilateral pleural effusion. EKG shows A-fib with RVR,  He was started on ceftriaxone and Zithromax along with IV diuresis.  11/1: Vital stable.  Likely acute on chronic HFrEF, remained very short of  breath with colder extremities, likely low output heart failure.  Procalcitonin negative so sepsis ruled out and antibiotics are being discontinued.  Cardiology and palliative care was consulted, patient currently wants to remain full code. Repeat echo was ordered.  11/2: Heart rate remained mildly elevated, improving renal function with creatinine at 2.35 today.  INR with some improvement but remained supratherapeutic at 4.4.  Oxygen requirement improved to 2 L.  Pending repeat echo.  CODE STATUS changed to DNR with full scope of medical care.  Continuing IV diuresis  11/3: Vitals mostly stable, improving INR to 3.1 today, slight worsening of BUN/creatinine, decreasing the dose of IV Lasix to 40 mg twice daily instead of 80.  PT is recommending home health.  Also still desaturate with ambulation-DME oxygen ordered.  11/4: Patient remained hemodynamically stable, saturating well on room air at rest but to have mild desaturation with ambulation, home oxygen was ordered yesterday.  Cardiology made some changes to his medications.  Patient is being discharged on torsemide instead of Lasix, low-dose [name] was also added.  He was advised to take Coumadin 2.5 mg daily, he was doing 2.5 mg and 5 mg on alternate days.  He was having supratherapeutic INR on admission, INR was therapeutic at 2.1 on the day of discharge.  Patient need to follow-up closely with Coumadin clinic and they can adjust his dosage as appropriate.  Patient will continue the rest of his home medications and need to have a close follow-up with his providers for further  management.  Assessment and Plan: * Acute on chronic combined systolic and diastolic CHF (congestive heart failure) (HCC) Prior echocardiogram with EF of 25 to 30%, elevated BNP and clinically appears hypervolemic.  Started on IV diuresis.. - Will continue diuresis with IV Lasix-dose decreased to 40 mg twice daily today, he will be discharged on low-dose torsemide per  cardiology. - We will continue Aldactone and Toprol-XL. - Will monitor I's and O's. -Daily weight and BMP  Sepsis due to pneumonia [facility]) Patient initially met SIRS criteria and there was a concern of pneumonia but likely bilateral atelectasis.  Procalcitonin negative and he does not have upper respiratory symptoms. Sepsis ruled out at this time and stopping IV antibiotics. Likely acute on chronic heart failure. -Continue supportive care and close monitoring  Atrial fibrillation with RVR (HCC) Heart rate remained mildly elevated. Supratherapeutic INR with Coumadin, with some improvement to 4.4 today -Continue with home metoprolol -Coumadin per pharmacy -Cardiology was also consulted  Acute respiratory failure with hypoxia (HCC) Likely secondary to heart failure.  No baseline oxygen use.  Currently on 2 L -Continue with supplemental oxygen-wean as tolerated -Patient continued to desaturate with ambulation although saturating well at rest on room air.  2 L of oxygen for home was ordered.  Essential hypertension - We will continue antihypertensive therapy.  Dyslipidemia - Continue statin therapy.  Diabetes mellitus (HCC) Patient was on metformin at home. -SSI  Memory loss - This could be associated with early dementia. - We will continue Namenda and Aricept.Marland Kitchen  GERD without esophagitis - We will continue PPI therapy.  BPH (benign prostatic hyperplasia) - We will continue Proscar.   Consultants: Cardiology Procedures performed: None Disposition: Home health Diet recommendation:  Discharge Diet Orders (From admission, onward)     Start     Ordered   07/23/23 0000  Diet - low sodium heart healthy        07/23/23 1039           Cardiac diet DISCHARGE MEDICATION: Allergies as of 07/23/2023   No Known Allergies      Medication List     STOP taking these medications    furosemide 40 MG tablet Commonly known as: LASIX   lisinopril 40 MG tablet Commonly  known as: ZESTRIL       TAKE these medications    Alcohol Prep Pads 1 each by Does not apply route daily.   B-D TB SYRINGE 1CC/27GX1/2" 27G X 1/2" 1 ML Misc Generic drug: TUBERCULIN SYR 1CC/27GX1/2" USE AS DIRECTED WITH  CYANOCOBALAMIN   cyanocobalamin 1000 MCG/ML injection Commonly known as: VITAMIN B12 Inject 1 ml subcutaneously once a month   dapagliflozin propanediol 5 MG Tabs tablet Commonly known as: Farxiga Take 1 tablet (5 mg total) by mouth daily.   donepezil 10 MG tablet Commonly known as: ARICEPT Take 1 tablet (10 mg total) by mouth daily.   finasteride 5 MG tablet Commonly known as: PROSCAR TAKE 1 TABLET BY MOUTH  DAILY   Lancets 33G Misc Check blood sugar 2 times daily   losartan 25 MG tablet Commonly known as: COZAAR Take 1 tablet (25 mg total) by mouth daily. Start taking on: July 24, 2023   meloxicam 15 MG tablet Commonly known as: MOBIC Take 15 mg by mouth daily.   memantine 10 MG tablet Commonly known as: NAMENDA Take 1 tablet (10 mg total) by mouth 2 (two) times daily.   metFORMIN 500 MG tablet Commonly known as: GLUCOPHAGE TAKE 1 TABLET BY MOUTH TWICE  DAILY WITH MEALS   metoprolol succinate 25 MG 24 hr tablet Commonly known as: TOPROL-XL TAKE 1 TABLET BY MOUTH DAILY   midodrine 5 MG tablet Commonly known as: PROAMATINE Take 1 tablet (5 mg total) by mouth 2 (two) times daily with a meal.   omeprazole 20 MG capsule Commonly known as: PRILOSEC TAKE 1 CAPSULE BY MOUTH DAILY   pravastatin 40 MG tablet Commonly known as: PRAVACHOL TAKE 1 TABLET BY MOUTH DAILY   ReliOn True Metrix Test Strips test strip Generic drug: glucose blood Check sugar 2 times daily   spironolactone 25 MG tablet Commonly known as: ALDACTONE TAKE 1 TABLET BY MOUTH DAILY   tamsulosin 0.4 MG Caps capsule Commonly known as: FLOMAX TAKE 1 CAPSULE BY MOUTH  DAILY   torsemide 20 MG tablet Commonly known as: DEMADEX Take 1 tablet (20 mg total) by mouth  daily.   True Metrix Air Glucose Meter Devi Use to check blood sugar 2 times daily   VITAMIN D PO Take 2,000 Units by mouth daily.   warfarin 2.5 MG tablet Commonly known as: Coumadin Take 1 tablet (2.5 mg total) by mouth daily. Take 2.5mg  in addition to the 5mg  on Saturday and Sunday What changed:  when to take this Another medication with the same name was removed. Continue taking this medication, and follow the directions you see here.               Durable Medical Equipment  (From admission, onward)           Start     Ordered   07/22/23 1223  For home use only DME lightweight manual wheelchair with seat cushion  Once       Comments: Patient suffers from heart failure and generalized weakness with advanced age which impairs their ability to perform daily activities like bathing, dressing, feeding, grooming, and toileting in the home.  A walker will not resolve  issue with performing activities of daily living. A wheelchair will allow patient to safely perform daily activities. Patient is not able to propel themselves in the home using a standard weight wheelchair due to general weakness. Patient can self propel in the lightweight wheelchair. Length of need Lifetime. Accessories: elevating leg rests (ELRs), wheel locks, extensions and anti-tippers.   07/22/23 1223   07/22/23 1137  For home use only DME oxygen  Once       Question Answer Comment  Length of Need Lifetime   Mode or (Route) Nasal cannula   Liters per Minute 2   Frequency Continuous (stationary and portable oxygen unit needed)   Oxygen conserving device Yes   Oxygen delivery system Gas      11 /03/24 1136            Follow-up Information     Alwyn Pea, MD Follow up.   Specialties: Cardiology, Internal Medicine Why: [facility] Heart failure clinic appointment scheduled for 11/5 at 3 PM, follow up with Dr. Juliann Pares scheduled for 12/3 at [facility] information: [address] [place]  [place] [address] [phone]         Corky Downs, MD. Schedule an appointment as soon as possible for a visit in 1 week(s).   Specialties: Internal Medicine, Cardiology Contact information: [address] [place] [address] [phone]                Discharge Exam: Filed Weights   07/20/23 0300 07/20/23 1757 07/23/23 0048  Weight: 70 kg 75.1 kg 72.8 kg  General.  Frail elderly man, in no acute distress. Pulmonary.  Lungs clear bilaterally, normal respiratory effort. CV.  Regular rate and rhythm, no JVD, rub or murmur. Abdomen.  Soft, nontender, nondistended, BS positive. CNS.  Alert and oriented .  No focal neurologic deficit. Extremities.  No edema, no cyanosis, pulses intact and symmetrical. Psychiatry.  Judgment and insight appears normal.   Condition at discharge: stable  The results of significant diagnostics from this hospitalization (including imaging, microbiology, ancillary and laboratory) are listed below for reference.   Imaging Studies: DG Chest 2 View  Result Date: 07/19/2023 CLINICAL DATA:  Shortness of breath for a couple of months. EXAM: CHEST - 2 VIEW COMPARISON:  January 01, 2023 FINDINGS: The cardiac silhouette is mildly enlarged and unchanged in size. Mild to moderate severity atelectasis and/or infiltrate is seen within the bilateral lung bases. Small bilateral pleural effusions are noted. No pneumothorax is identified. Multilevel degenerative changes are seen throughout the thoracic spine. IMPRESSION: 1. Mild to moderate severity bibasilar atelectasis and/or infiltrate. 2. Small bilateral pleural effusions. Electronically Signed   By: Aram Candela M.D.   On: 07/19/2023 22:50    Microbiology: Results for orders placed or performed during the hospital encounter of 07/20/23  Blood Culture (routine x 2)     Status: None (Preliminary result)   Collection Time: 07/20/23  2:23 AM   Specimen: BLOOD  Result Value Ref Range Status   Specimen  Description BLOOD RIGHT ANTECUBITAL  Final   Special Requests   Final    BOTTLES DRAWN AEROBIC AND ANAEROBIC Blood Culture adequate volume   Culture   Final    NO GROWTH 3 DAYS Performed at [facility], [address] [place], [place] [address]    Report Status PENDING  Incomplete  Resp panel by RT-PCR (RSV, Flu A&B, Covid) Anterior Nasal Swab     Status: None   Collection Time: 07/20/23  3:01 AM   Specimen: Anterior Nasal Swab  Result Value Ref Range Status   SARS Coronavirus 2 by RT PCR NEGATIVE NEGATIVE Final    Comment: (NOTE) SARS-CoV-2 target nucleic acids are NOT DETECTED.  The SARS-CoV-2 RNA is generally detectable in upper respiratory specimens during the acute phase of infection. The lowest concentration of SARS-CoV-2 viral copies this assay can detect is 138 copies/mL. A negative result does not preclude SARS-Cov-2 infection and should not be used as the sole basis for treatment or other patient management decisions. A negative result may occur with  improper specimen collection/handling, submission of specimen other than nasopharyngeal swab, presence of viral mutation(s) within the areas targeted by this assay, and inadequate number of viral copies(<138 copies/mL). A negative result must be combined with clinical observations, patient history, and epidemiological information. The expected result is Negative.  Fact Sheet for Patients:  BloggerCourse.com  Fact Sheet for Healthcare Providers:  SeriousBroker.it  This test is no t yet approved or cleared by the [place] FDA and  has been authorized for detection and/or diagnosis of SARS-CoV-2 by FDA under an Emergency Use Authorization (EUA). This EUA will remain  in effect (meaning this test can be used) for the duration of the COVID-19 declaration under Section 564(b)(1) of the Act, 21 U.S.C.section 360bbb-3(b)(1), unless the authorization is terminated   or revoked sooner.       Influenza A by PCR NEGATIVE NEGATIVE Final   Influenza B by PCR NEGATIVE NEGATIVE Final    Comment: (NOTE) The Xpert Xpress SARS-CoV-2/FLU/RSV plus assay is intended as an  aid in the diagnosis of influenza from Nasopharyngeal swab specimens and should not be used as a sole basis for treatment. Nasal washings and aspirates are unacceptable for Xpert Xpress SARS-CoV-2/FLU/RSV testing.  Fact Sheet for Patients: BloggerCourse.com  Fact Sheet for Healthcare Providers: SeriousBroker.it  This test is not yet approved or cleared by the [place] FDA and has been authorized for detection and/or diagnosis of SARS-CoV-2 by FDA under an Emergency Use Authorization (EUA). This EUA will remain in effect (meaning this test can be used) for the duration of the COVID-19 declaration under Section 564(b)(1) of the Act, 21 U.S.C. section 360bbb-3(b)(1), unless the authorization is terminated or revoked.     Resp Syncytial Virus by PCR NEGATIVE NEGATIVE Final    Comment: (NOTE) Fact Sheet for Patients: BloggerCourse.com  Fact Sheet for Healthcare Providers: SeriousBroker.it  This test is not yet approved or cleared by the [place] FDA and has been authorized for detection and/or diagnosis of SARS-CoV-2 by FDA under an Emergency Use Authorization (EUA). This EUA will remain in effect (meaning this test can be used) for the duration of the COVID-19 declaration under Section 564(b)(1) of the Act, 21 U.S.C. section 360bbb-3(b)(1), unless the authorization is terminated or revoked.  Performed at [facility], [address] [place], [place] [address]   Blood Culture (routine x 2)     Status: None (Preliminary result)   Collection Time: 07/20/23  3:02 AM   Specimen: BLOOD  Result Value Ref Range Status   Specimen Description BLOOD LEFT HAND  Final    Special Requests   Final    BOTTLES DRAWN AEROBIC AND ANAEROBIC Blood Culture adequate volume   Culture   Final    NO GROWTH 3 DAYS Performed at [facility], [address] [place], [place] [address]    Report Status PENDING  Incomplete    Labs: CBC: Recent Labs  Lab 07/19/23 2118 07/21/23 0625 07/22/23 0459  WBC 8.4 8.5 9.1  HGB 10.3* 10.2* 10.0*  HCT 32.4* 31.9* 30.3*  MCV 96.4 96.4 92.7  PLT 272 239 249   Basic Metabolic Panel: Recent Labs  Lab 07/19/23 2118 07/21/23 0625 07/22/23 0459 07/23/23 0454  NA 136 137 138 140  K 4.6 4.7 5.0 4.5  CL 104 103 103 103  CO2 25 25 28 26   GLUCOSE 207* 131* 153* 132*  BUN 42* 39* 42* 41*  CREATININE 2.42* 2.35* 2.42* 2.35*  CALCIUM 8.4* 8.6* 8.7* 8.7*   Liver Function Tests: Recent Labs  Lab 07/20/23 0300  AST 13*  ALT 12  ALKPHOS 97  BILITOT 0.5  PROT 6.9  ALBUMIN 2.9*   CBG: Recent Labs  Lab 07/22/23 0810 07/22/23 1208 07/22/23 1726 07/22/23 2019 07/23/23 0738  GLUCAP 121* 289* 148* 134* 136*    Discharge time spent: greater than 30 minutes.  This record has been created using Conservation officer, historic buildings. Errors have been sought and corrected,but may not always be located. Such creation errors do not reflect on the standard of care.   Signed: Arnetha Courser, MD [facility] 07/23/2023

## 2023-07-23 NOTE — Telephone Encounter (Signed)
Last seen on 07/25/22 Follow up scheduled on 07/26/23

## 2023-07-23 NOTE — Progress Notes (Signed)
[facility] CLINIC CARDIOLOGY PROGRESS NOTE   Patient ID: Bob Brewer MRN: [id number] DOB/AGE: 81/16/1942 81 y.o.  Admit date: 07/20/2023 Referring Physician Dr. Nelson Chimes Primary Physician Corky Downs, MD  Primary Cardiologist Dr. Beatrix Fetters (last seen 2022) Reason for Consultation AoCHFrEF  HPI: Bob Brewer is a 82 y.o. male with a past medical history of chronic HFrEF, chronic atrial fibrillation, diabetes, hypertension who presented to the ED on 07/20/2023 for worsening exertional dyspnea and increased pedal edema . Cardiology was consulted for further assistance.   Interval History:  -Patient reports feeling well this AM. Denies any CP, SOB.  -LE significantly improved per patient and his wife.  -BP and HR stable. Renal function slightly improved on AM labs.   Review of systems complete and found to be negative unless listed above    Vitals:   07/23/23 0047 07/23/23 0048 07/23/23 0449 07/23/23 0738  BP: 108/81  104/66 (!) 123/90  Pulse: (!) 52  (!) 108 94  Resp: 18  18 17   Temp: 98.6 F (37 C)  98.5 F (36.9 C) 98.3 F (36.8 C)  TempSrc:      SpO2: 95%  97% 98%  Weight:  72.8 kg    Height:         Intake/Output Summary (Last 24 hours) at 07/23/2023 1058 Last data filed at 07/23/2023 5284 Gross per 24 hour  Intake --  Output 2150 ml  Net -2150 ml    PHYSICAL EXAM General: Well appearing, well nourished, in no acute distress. HEENT: Normocephalic and atraumatic. Neck: No JVD.  Lungs: Normal respiratory effort on room air. Clear bilaterally to auscultation. No wheezes, crackles, rhonchi.  Heart: Irregularly irregualr. Normal S1 and S2 without gallops or murmurs. Radial & DP pulses 2+ bilaterally. Abdomen: Non-distended appearing.  Msk: Normal strength and tone for age. Extremities: No clubbing, cyanosis or edema.   Neuro: Alert and oriented X 3. Psych: Mood appropriate, affect congruent.    LABS: Basic Metabolic Panel: Recent Labs    07/22/23 0459  07/23/23 0454  NA 138 140  K 5.0 4.5  CL 103 103  CO2 28 26  GLUCOSE 153* 132*  BUN 42* 41*  CREATININE 2.42* 2.35*  CALCIUM 8.7* 8.7*   Liver Function Tests: No results for input(s): "AST", "ALT", "ALKPHOS", "BILITOT", "PROT", "ALBUMIN" in the last 72 hours. No results for input(s): "LIPASE", "AMYLASE" in the last 72 hours. CBC: Recent Labs    07/21/23 0625 07/22/23 0459  WBC 8.5 9.1  HGB 10.2* 10.0*  HCT 31.9* 30.3*  MCV 96.4 92.7  PLT 239 249   Cardiac Enzymes: No results for input(s): "CKTOTAL", "CKMB", "CKMBINDEX", "TROPONINIHS" in the last 72 hours. BNP: No results for input(s): "BNP" in the last 72 hours. D-Dimer: No results for input(s): "DDIMER" in the last 72 hours. Hemoglobin A1C: Recent Labs    07/21/23 0625  HGBA1C 7.6*   Fasting Lipid Panel: No results for input(s): "CHOL", "HDL", "LDLCALC", "TRIG", "CHOLHDL", "LDLDIRECT" in the last 72 hours. Thyroid Function Tests: No results for input(s): "TSH", "T4TOTAL", "T3FREE", "THYROIDAB" in the last 72 hours.  Invalid input(s): "FREET3" Anemia Panel: No results for input(s): "VITAMINB12", "FOLATE", "FERRITIN", "TIBC", "IRON", "RETICCTPCT" in the last 72 hours.  No results found.   ECHO pending  TELEMETRY reviewed by me 07/23/23: not on tele  EKG reviewed by me 07/23/23: atrial fibrillation RVR rate 122 bpm  DATA reviewed by me 07/23/23: last 24h vitals tele labs imaging I/O, hospitalist progress note  Principal Problem:   Acute  on chronic combined systolic and diastolic CHF (congestive heart failure) (HCC) Active Problems:   Memory loss   Diabetes mellitus (HCC)   Essential hypertension   Sepsis due to pneumonia (HCC)   Atrial fibrillation with RVR (HCC)   Dyslipidemia   GERD without esophagitis   BPH (benign prostatic hyperplasia)   Acute respiratory failure with hypoxia (HCC)   Acute on chronic congestive heart failure (HCC)    ASSESSMENT AND PLAN: Bob Brewer is a 82 y.o. male  with a past medical history of chronic HFrEF, chronic atrial fibrillation, diabetes, hypertension who presented to the ED on 07/20/2023 for worsening exertional dyspnea and increased pedal edema . Cardiology was consulted for further assistance.   # Acute on chronic HFrEF # Acute hypoxic respiratory failure Patient presented with worsening SOB and LE edema. Initially placed on supplemental O2 to maintain saturations. BNP elevated at 1700.  -Echo pending -Continue torsemide 20 mg daily.  -Continue metoprolol succinate 25 mg daily, losartan 25 mg daily, spironolactone 25 mg daily, and farxiga 5 mg daily for GDMT. Consider uptitration of meds at outpatient follow up pending BP and renal function.   # Atrial fibrillation RVR # Chronic atrial fibrillation Patient with known hx of atrial fibrillation presented in AF with rates in the 120s. HR now improved.  -Continue metoprolol 25 mg daily for rate control.  -Continue warfarin for stroke risk reduction.   # AKI on CKD stage IIIb Patient presented with Cr up to 2.42 from 1.8 in 12/2022. Mildly improved to 2.35 on AM labs.  -Continue to monitor renal function closely.   Ok for discharge today from a cardiac perspective. Patient scheduled for HF clinic appointment at [facility] cardiology on 11/5 at 3 PM. Scheduled for hospital follow up with Dr. Juliann Pares on 12/3.   This patient's case was discussed and created with Dr. Melton Alar and she is in agreement.  Signed:  Gale Journey, PA-C  07/23/2023, 10:58 AM [facility] Cardiology

## 2023-07-23 NOTE — Plan of Care (Signed)
  Problem: Education: Goal: Ability to describe self-care measures that may prevent or decrease complications (Diabetes Survival Skills Education) will improve Outcome: Adequate for Discharge Goal: Individualized Educational Video(s) Outcome: Adequate for Discharge   Problem: Coping: Goal: Ability to adjust to condition or change in health will improve Outcome: Adequate for Discharge   Problem: Fluid Volume: Goal: Ability to maintain a balanced intake and output will improve Outcome: Adequate for Discharge   Problem: Health Behavior/Discharge Planning: Goal: Ability to identify and utilize available resources and services will improve Outcome: Adequate for Discharge Goal: Ability to manage health-related needs will improve Outcome: Adequate for Discharge   Problem: Metabolic: Goal: Ability to maintain appropriate glucose levels will improve Outcome: Adequate for Discharge   Problem: Nutritional: Goal: Maintenance of adequate nutrition will improve Outcome: Adequate for Discharge Goal: Progress toward achieving an optimal weight will improve Outcome: Adequate for Discharge   Problem: Skin Integrity: Goal: Risk for impaired skin integrity will decrease Outcome: Adequate for Discharge   Problem: Tissue Perfusion: Goal: Adequacy of tissue perfusion will improve Outcome: Adequate for Discharge   Problem: Fluid Volume: Goal: Hemodynamic stability will improve Outcome: Adequate for Discharge   Problem: Clinical Measurements: Goal: Diagnostic test results will improve Outcome: Adequate for Discharge Goal: Signs and symptoms of infection will decrease Outcome: Adequate for Discharge   Problem: Respiratory: Goal: Ability to maintain adequate ventilation will improve Outcome: Adequate for Discharge   Problem: Education: Goal: Knowledge of General Education information will improve Description: Including pain rating scale, medication(s)/side effects and non-pharmacologic  comfort measures Outcome: Adequate for Discharge   Problem: Health Behavior/Discharge Planning: Goal: Ability to manage health-related needs will improve Outcome: Adequate for Discharge   Problem: Clinical Measurements: Goal: Ability to maintain clinical measurements within normal limits will improve Outcome: Adequate for Discharge Goal: Will remain free from infection Outcome: Adequate for Discharge Goal: Diagnostic test results will improve Outcome: Adequate for Discharge Goal: Respiratory complications will improve Outcome: Adequate for Discharge Goal: Cardiovascular complication will be avoided Outcome: Adequate for Discharge   Problem: Activity: Goal: Risk for activity intolerance will decrease Outcome: Adequate for Discharge   Problem: Nutrition: Goal: Adequate nutrition will be maintained Outcome: Adequate for Discharge   Problem: Coping: Goal: Level of anxiety will decrease Outcome: Adequate for Discharge   Problem: Elimination: Goal: Will not experience complications related to bowel motility Outcome: Adequate for Discharge Goal: Will not experience complications related to urinary retention Outcome: Adequate for Discharge   Problem: Pain Management: Goal: General experience of comfort will improve Outcome: Adequate for Discharge   Problem: Safety: Goal: Ability to remain free from injury will improve Outcome: Adequate for Discharge   Problem: Skin Integrity: Goal: Risk for impaired skin integrity will decrease Outcome: Adequate for Discharge

## 2023-07-23 NOTE — Progress Notes (Signed)
This patient's wife removed his PIV prior to discharge.

## 2023-07-23 NOTE — Progress Notes (Deleted)
No chief complaint on file.    HISTORY OF PRESENT ILLNESS:  07/23/23 ALL:  Bob Brewer returns for follow up for AD. He was last seen by Dr Epimenio Foot 07/2022. MMSE was 21/30. Donepezil and memantine continued. Since,   04/21/2021 ALL:  Bob Brewer is a 82 y.o. male here today for follow up for AD. Labs showed B12 and vitamin D deficiency. He continues donepezil and memantine. He started B12 injections and vitamin D 2000iu daily. He feels he is doing really well. His wife is a bit tearful as she feels he continues to have difficulty with short term memory loss and trouble recalling information. He is tolerating medications well. He is able to complete ADLs independently. He is driving daytime, short local distances with no difficulty. He is eating well. He does wake at night to use restroom but usually able to get back to sleep. Goes to bed around 7-8 and wakes around 6.    HISTORY (copied from Dr Bonnita Hollow previous note)  I had the pleasure seeing your patient, Bob Brewer, at [facility] for neurologic consultation regarding his memory loss and other cognitive issues.   He is a 82 year old man who first noted memory problems 6 months ago.  He does feel he is having more trouble with memory and also some tasks around the home.  His wife first noted that he was forgetful about 2 years ago.  He would not remember things he recently did.  As an example the day after they went out to eat at a restaurant he did not recall that he had been there.  Additionally, she needed to take over more responsibility for complex tasks - like tasks with the computer or fixing the phone.   More recently she notes he has more trouble coming up with the right names or words.     He started donepezil but it has not improved any of his cognitive symptoms.          He sleeps poorly many nights due to nocturia.   He does seem to quickly fall back asleep most times after he wakes up, however.  He will  sometimes takes naps.    He snores but no OSA signs.      He has Type 2 NIDDM (30 years).  He has elevated cholesterol.  No known thyroid dysfunction.  No known deficiencies.   He used to work as a Midwife and retired in 1994.   [place] and he did a lot of outdoor activities after retirement and then returned around 2001.  He did some fishing but mostly spent time surfing [employer], build computers to resell.   He stopped about 10 years ago and mostly surfs the web or watches TV.      He had an MRI of the brain 02/17/2020 that I personally reviewed.  It shows moderate generalized cortical atrophy that is more severe in the medial temporal lobes.  There is only a minimal amount of age-appropriate chronic microvascular ischemic change.  A pattern like this is consistent with Alzheimer's disease.   REVIEW OF SYSTEMS: Out of a complete 14 system review of symptoms, the patient complains only of the following symptoms, memory loss and all other reviewed systems are negative.   ALLERGIES: No Known Allergies   HOME MEDICATIONS: Outpatient Medications Prior to Visit  Medication Sig Dispense Refill   Alcohol Swabs (ALCOHOL PREP) PADS 1 each by Does not apply  route daily. 300 each 3   B-D TB SYRINGE 1CC/27GX1/2" 27G X 1/2" 1 ML MISC USE AS DIRECTED WITH  CYANOCOBALAMIN 4 each 4   Blood Glucose Monitoring Suppl (TRUE METRIX AIR GLUCOSE METER) DEVI Use to check blood sugar 2 times daily 1 each 6   cyanocobalamin (VITAMIN B12) 1000 MCG/ML injection Inject 1 ml subcutaneously once a month 3 mL 3   dapagliflozin propanediol (FARXIGA) 5 MG TABS tablet Take 1 tablet (5 mg total) by mouth daily. 30 tablet 1   donepezil (ARICEPT) 10 MG tablet Take 1 tablet (10 mg total) by mouth daily. 90 tablet 0   finasteride (PROSCAR) 5 MG tablet TAKE 1 TABLET BY MOUTH  DAILY 90 tablet 3   glucose blood (RELION TRUE METRIX TEST STRIPS) test strip Check sugar 2 times daily 300 each 3   Lancets 33G MISC  Check blood sugar 2 times daily 300 each 3   [START ON 07/24/2023] losartan (COZAAR) 25 MG tablet Take 1 tablet (25 mg total) by mouth daily. 30 tablet 1   meloxicam (MOBIC) 15 MG tablet Take 15 mg by mouth daily.     memantine (NAMENDA) 10 MG tablet Take 1 tablet (10 mg total) by mouth 2 (two) times daily. 60 tablet 3   metFORMIN (GLUCOPHAGE) 500 MG tablet TAKE 1 TABLET BY MOUTH TWICE  DAILY WITH MEALS 200 tablet 2   metoprolol succinate (TOPROL-XL) 25 MG 24 hr tablet TAKE 1 TABLET BY MOUTH DAILY 100 tablet 2   midodrine (PROAMATINE) 5 MG tablet Take 1 tablet (5 mg total) by mouth 2 (two) times daily with a meal. 60 tablet 1   omeprazole (PRILOSEC) 20 MG capsule TAKE 1 CAPSULE BY MOUTH DAILY 100 capsule 2   pravastatin (PRAVACHOL) 40 MG tablet TAKE 1 TABLET BY MOUTH DAILY 100 tablet 2   spironolactone (ALDACTONE) 25 MG tablet TAKE 1 TABLET BY MOUTH DAILY 100 tablet 2   tamsulosin (FLOMAX) 0.4 MG CAPS capsule TAKE 1 CAPSULE BY MOUTH  DAILY 90 capsule 3   torsemide (DEMADEX) 20 MG tablet Take 1 tablet (20 mg total) by mouth daily. 30 tablet 1   VITAMIN D PO Take 2,000 Units by mouth daily.     warfarin (COUMADIN) 2.5 MG tablet Take 1 tablet (2.5 mg total) by mouth daily. Take 2.5mg  in addition to the 5mg  on Saturday and Sunday 90 tablet 3   No facility-administered medications prior to visit.     PAST MEDICAL HISTORY: Past Medical History:  Diagnosis Date   Diabetes mellitus without complication (HCC)    Hypertension    Memory loss      PAST SURGICAL HISTORY: Past Surgical History:  Procedure Laterality Date   BACK SURGERY  1992   also 1994     FAMILY HISTORY: No family history on file.   SOCIAL HISTORY: Social History   Socioeconomic History   Marital status: Married    Spouse name: Eber Jones   Number of children: Not on file   Years of education: Not on file   Highest education level: 12th grade  Occupational History   Occupation: Retired  Tobacco Use   Smoking  status: Former    Types: Cigarettes   Smokeless tobacco: Never  Substance and Sexual Activity   Alcohol use: Not Currently   Drug use: Never   Sexual activity: Not Currently  Other Topics Concern   Not on file  Social History Narrative   Life with wife   Right handed   Drinks 5-6  cups caffeine daily   Social Determinants of Health   Financial Resource Strain: Low Risk  (08/30/2021)   Overall Financial Resource Strain (CARDIA)    Difficulty of Paying Living Expenses: Not hard at all  Food Insecurity: No Food Insecurity (07/20/2023)   Hunger Vital Sign    Worried About Running Out of Food in the Last Year: Never true    Ran Out of Food in the Last Year: Never true  Transportation Needs: No Transportation Needs (07/20/2023)   PRAPARE - Administrator, Civil Service (Medical): No    Lack of Transportation (Non-Medical): No  Physical Activity: Inactive (08/30/2021)   Exercise Vital Sign    Days of Exercise per Week: 0 days    Minutes of Exercise per Session: 0 min  Stress: No Stress Concern Present (08/30/2021)   [employer] of Occupational Health - Occupational Stress Questionnaire    Feeling of Stress : Not at all  Social Connections: Socially Integrated (08/30/2021)   Social Connection and Isolation Panel [NHANES]    Frequency of Communication with Friends and Family: More than three times a week    Frequency of Social Gatherings with Friends and Family: Twice a week    Attends Religious Services: More than 4 times per year    Active Member of [employer] or Organizations: Yes    Attends Engineer, structural: More than 4 times per year    Marital Status: Married  Catering manager Violence: Not At Risk (07/20/2023)   Humiliation, Afraid, Rape, and Kick questionnaire    Fear of Current or Ex-Partner: No    Emotionally Abused: No    Physically Abused: No    Sexually Abused: No     PHYSICAL EXAM  There were no vitals filed for this visit.  There is no  height or weight on file to calculate BMI.   Generalized: Well developed, in no acute distress  Cardiology: normal rate and rhythm, no murmur auscultated  Respiratory: clear to auscultation bilaterally    Neurological examination  Mentation: Alert oriented to time, place, history taking. Follows all commands speech and language fluent Cranial nerve II-XII: Pupils were equal round reactive to light. Extraocular movements were full, visual field were full on confrontational test. Facial sensation and strength were normal. Head turning and shoulder shrug  were normal and symmetric. Motor: The motor testing reveals 5 over 5 strength of all 4 extremities. Good symmetric motor tone is noted throughout.  Sensory: Sensory testing is intact to soft touch on all 4 extremities. No evidence of extinction is noted.  Coordination: Cerebellar testing reveals good finger-nose-finger and heel-to-shin bilaterally.  Gait and station: Gait is normal.    DIAGNOSTIC DATA (LABS, IMAGING, TESTING) - I reviewed patient records, labs, notes, testing and imaging myself where available.  Lab Results  Component Value Date   WBC 9.1 07/22/2023   HGB 10.0 (L) 07/22/2023   HCT 30.3 (L) 07/22/2023   MCV 92.7 07/22/2023   PLT 249 07/22/2023      Component Value Date/Time   NA 140 07/23/2023 0454   NA 142 06/15/2012 0504   K 4.5 07/23/2023 0454   K 3.4 (L) 06/15/2012 0504   CL 103 07/23/2023 0454   CL 104 06/15/2012 0504   CO2 26 07/23/2023 0454   CO2 26 06/15/2012 0504   GLUCOSE 132 (H) 07/23/2023 0454   GLUCOSE 153 (H) 06/15/2012 0504   BUN 41 (H) 07/23/2023 0454   BUN 15 06/15/2012 0504   CREATININE  2.35 (H) 07/23/2023 0454   CREATININE 1.58 (H) 07/15/2020 1047   CALCIUM 8.7 (L) 07/23/2023 0454   CALCIUM 8.9 06/15/2012 0504   PROT 6.9 07/20/2023 0300   PROT 7.7 06/15/2012 0504   ALBUMIN 2.9 (L) 07/20/2023 0300   ALBUMIN 3.7 06/15/2012 0504   AST 13 (L) 07/20/2023 0300   AST 20 06/15/2012 0504    ALT 12 07/20/2023 0300   ALT 33 06/15/2012 0504   ALKPHOS 97 07/20/2023 0300   ALKPHOS 85 06/15/2012 0504   BILITOT 0.5 07/20/2023 0300   BILITOT 0.3 06/15/2012 0504   GFRNONAA 27 (L) 07/23/2023 0454   GFRNONAA 41 (L) 07/15/2020 1047   GFRAA 48 (L) 07/15/2020 1047   No results found for: "CHOL", "HDL", "LDLCALC", "LDLDIRECT", "TRIG", "CHOLHDL" Lab Results  Component Value Date   HGBA1C 7.6 (H) 07/21/2023   Lab Results  Component Value Date   VITAMINB12 365 04/21/2021   Lab Results  Component Value Date   TSH 0.853 10/22/2020       07/25/2022    8:32 AM 04/21/2021    9:32 AM 10/22/2020   12:49 PM  MMSE - Mini Mental State Exam  Orientation to time 4 4 3   Orientation to Place 5 3 4   Registration 3 3 3   Attention/ Calculation 0 3 1  Recall 1 0 0  Language- name 2 objects 2 2 2   Language- repeat 1 1 1   Language- follow 3 step command 3 3 3   Language- read & follow direction 1 1 1   Write a sentence 0 1 1  Copy design 1 1 1   Total score 21 22 20          No data to display           ASSESSMENT AND PLAN  82 y.o. year old male  has a past medical history of Diabetes mellitus without complication (HCC), Hypertension, and Memory loss. here with    No diagnosis found.  Kayle feels he is doing well. His wife reports that he continues to have difficulty with short term memory and recall. MMSE 22/30, today. Previously 20/30. We will continue memantine 10mg  BID and donepezil 10mg  daily. I will update labs, today. He will continue vitamin B12 and D supplements pending blood work results. Healthy lifestyle habits advised. He will follow up with me in 6 months.    No orders of the defined types were placed in this encounter.     No orders of the defined types were placed in this encounter.      Shawnie Dapper, MSN, FNP-C 07/23/2023, 4:37 PM  [facility] [address] [place], [place] [address] [phone]

## 2023-07-24 DIAGNOSIS — I4891 Unspecified atrial fibrillation: Secondary | ICD-10-CM | POA: Diagnosis not present

## 2023-07-24 DIAGNOSIS — E0800 Diabetes mellitus due to underlying condition with hyperosmolarity without nonketotic hyperglycemic-hyperosmolar coma (NKHHC): Secondary | ICD-10-CM | POA: Diagnosis not present

## 2023-07-24 DIAGNOSIS — I5023 Acute on chronic systolic (congestive) heart failure: Secondary | ICD-10-CM | POA: Diagnosis not present

## 2023-07-25 LAB — CULTURE, BLOOD (ROUTINE X 2)
Culture: NO GROWTH
Culture: NO GROWTH
Special Requests: ADEQUATE
Special Requests: ADEQUATE

## 2023-07-26 ENCOUNTER — Encounter: Payer: Self-pay | Admitting: Family Medicine

## 2023-07-26 ENCOUNTER — Ambulatory Visit: Payer: Medicare Other | Admitting: Family Medicine

## 2023-07-26 DIAGNOSIS — F0280 Dementia in other diseases classified elsewhere without behavioral disturbance: Secondary | ICD-10-CM

## 2023-08-10 ENCOUNTER — Other Ambulatory Visit: Payer: Self-pay | Admitting: Neurology

## 2023-08-10 DIAGNOSIS — R413 Other amnesia: Secondary | ICD-10-CM

## 2023-08-13 NOTE — Telephone Encounter (Signed)
Last seen on 07/25/22 No follow up scheduled   Both Rx's are too soon to refill

## 2023-09-28 ENCOUNTER — Other Ambulatory Visit: Payer: Self-pay | Admitting: Neurology

## 2023-10-14 ENCOUNTER — Other Ambulatory Visit: Payer: Self-pay | Admitting: Neurology

## 2023-10-20 DEATH — deceased

## 2023-11-06 ENCOUNTER — Ambulatory Visit: Payer: Medicare Other | Admitting: Family Medicine
